# Patient Record
Sex: Female | Born: 1955 | Hispanic: No | Marital: Married | Smoking: Never smoker
Health system: Southern US, Community
[De-identification: ages and names within clinical notes are randomized; demographics above are authoritative.]

## PROBLEM LIST (undated history)

## (undated) DIAGNOSIS — I10 Essential (primary) hypertension: Secondary | ICD-10-CM

## (undated) DIAGNOSIS — E119 Type 2 diabetes mellitus without complications: Secondary | ICD-10-CM

## (undated) DIAGNOSIS — F419 Anxiety disorder, unspecified: Secondary | ICD-10-CM

## (undated) DIAGNOSIS — M1990 Unspecified osteoarthritis, unspecified site: Secondary | ICD-10-CM

## (undated) DIAGNOSIS — T8859XA Other complications of anesthesia, initial encounter: Secondary | ICD-10-CM

## (undated) DIAGNOSIS — N390 Urinary tract infection, site not specified: Secondary | ICD-10-CM

## (undated) DIAGNOSIS — H9190 Unspecified hearing loss, unspecified ear: Secondary | ICD-10-CM

## (undated) DIAGNOSIS — R51 Headache: Secondary | ICD-10-CM

## (undated) DIAGNOSIS — E079 Disorder of thyroid, unspecified: Secondary | ICD-10-CM

## (undated) DIAGNOSIS — T7840XA Allergy, unspecified, initial encounter: Secondary | ICD-10-CM

## (undated) DIAGNOSIS — E039 Hypothyroidism, unspecified: Secondary | ICD-10-CM

## (undated) DIAGNOSIS — H15009 Unspecified scleritis, unspecified eye: Secondary | ICD-10-CM

## (undated) DIAGNOSIS — K219 Gastro-esophageal reflux disease without esophagitis: Secondary | ICD-10-CM

## (undated) DIAGNOSIS — G5600 Carpal tunnel syndrome, unspecified upper limb: Secondary | ICD-10-CM

## (undated) HISTORY — DX: Anxiety disorder, unspecified: F41.9

## (undated) HISTORY — PX: INNER EAR SURGERY: SHX679

## (undated) HISTORY — DX: Unspecified scleritis, unspecified eye: H15.009

## (undated) HISTORY — DX: Headache: R51

## (undated) HISTORY — PX: UPPER GASTROINTESTINAL ENDOSCOPY: SHX188

## (undated) HISTORY — DX: Allergy, unspecified, initial encounter: T78.40XA

## (undated) HISTORY — DX: Unspecified hearing loss, unspecified ear: H91.90

## (undated) HISTORY — DX: Disorder of thyroid, unspecified: E07.9

## (undated) HISTORY — PX: FRACTURE SURGERY: SHX138

## (undated) HISTORY — PX: TUBAL LIGATION: SHX77

## (undated) HISTORY — DX: Carpal tunnel syndrome, unspecified upper limb: G56.00

## (undated) HISTORY — DX: Gastro-esophageal reflux disease without esophagitis: K21.9

## (undated) HISTORY — DX: Urinary tract infection, site not specified: N39.0

---

## 1997-03-26 ENCOUNTER — Ambulatory Visit (HOSPITAL_COMMUNITY): Admission: RE | Admit: 1997-03-26 | Discharge: 1997-03-26 | Payer: Self-pay | Admitting: Family Medicine

## 1998-05-13 ENCOUNTER — Ambulatory Visit (HOSPITAL_COMMUNITY): Admission: RE | Admit: 1998-05-13 | Discharge: 1998-05-13 | Payer: Self-pay | Admitting: Family Medicine

## 1998-05-13 ENCOUNTER — Encounter: Payer: Self-pay | Admitting: Family Medicine

## 1999-04-20 ENCOUNTER — Other Ambulatory Visit: Admission: RE | Admit: 1999-04-20 | Discharge: 1999-04-20 | Payer: Self-pay | Admitting: Internal Medicine

## 1999-05-14 ENCOUNTER — Encounter: Payer: Self-pay | Admitting: Internal Medicine

## 1999-05-14 ENCOUNTER — Ambulatory Visit (HOSPITAL_COMMUNITY): Admission: RE | Admit: 1999-05-14 | Discharge: 1999-05-14 | Payer: Self-pay | Admitting: Internal Medicine

## 2000-07-05 ENCOUNTER — Ambulatory Visit (HOSPITAL_COMMUNITY): Admission: RE | Admit: 2000-07-05 | Discharge: 2000-07-05 | Payer: Self-pay | Admitting: Internal Medicine

## 2000-07-05 ENCOUNTER — Encounter: Payer: Self-pay | Admitting: Internal Medicine

## 2001-08-03 ENCOUNTER — Encounter: Payer: Self-pay | Admitting: Internal Medicine

## 2001-08-03 ENCOUNTER — Ambulatory Visit (HOSPITAL_COMMUNITY): Admission: RE | Admit: 2001-08-03 | Discharge: 2001-08-03 | Payer: Self-pay | Admitting: Internal Medicine

## 2002-04-11 ENCOUNTER — Encounter: Admission: RE | Admit: 2002-04-11 | Discharge: 2002-04-11 | Payer: Self-pay | Admitting: Internal Medicine

## 2002-04-11 ENCOUNTER — Encounter: Payer: Self-pay | Admitting: Internal Medicine

## 2002-06-18 ENCOUNTER — Other Ambulatory Visit: Admission: RE | Admit: 2002-06-18 | Discharge: 2002-06-18 | Payer: Self-pay | Admitting: Internal Medicine

## 2004-01-07 ENCOUNTER — Other Ambulatory Visit: Admission: RE | Admit: 2004-01-07 | Discharge: 2004-01-07 | Payer: Self-pay | Admitting: Internal Medicine

## 2004-01-07 ENCOUNTER — Ambulatory Visit: Payer: Self-pay | Admitting: Internal Medicine

## 2005-04-08 ENCOUNTER — Ambulatory Visit: Payer: Self-pay | Admitting: Internal Medicine

## 2005-04-12 ENCOUNTER — Ambulatory Visit (HOSPITAL_COMMUNITY): Admission: RE | Admit: 2005-04-12 | Discharge: 2005-04-12 | Payer: Self-pay | Admitting: Internal Medicine

## 2005-04-15 ENCOUNTER — Ambulatory Visit: Payer: Self-pay | Admitting: Internal Medicine

## 2005-04-15 ENCOUNTER — Other Ambulatory Visit: Admission: RE | Admit: 2005-04-15 | Discharge: 2005-04-15 | Payer: Self-pay | Admitting: Internal Medicine

## 2005-04-18 ENCOUNTER — Ambulatory Visit: Payer: Self-pay | Admitting: Gastroenterology

## 2005-04-19 ENCOUNTER — Encounter: Admission: RE | Admit: 2005-04-19 | Discharge: 2005-04-19 | Payer: Self-pay | Admitting: Internal Medicine

## 2005-05-02 ENCOUNTER — Ambulatory Visit: Payer: Self-pay | Admitting: Gastroenterology

## 2005-05-02 LAB — HM COLONOSCOPY: HM Colonoscopy: NORMAL

## 2005-06-23 ENCOUNTER — Ambulatory Visit: Payer: Self-pay | Admitting: Family Medicine

## 2005-06-27 ENCOUNTER — Ambulatory Visit: Payer: Self-pay | Admitting: Internal Medicine

## 2005-12-15 ENCOUNTER — Ambulatory Visit: Payer: Self-pay | Admitting: Internal Medicine

## 2005-12-15 LAB — CONVERTED CEMR LAB
BUN: 14 mg/dL (ref 6–23)
Basophils Relative: 0.7 % (ref 0.0–1.0)
CO2: 28 meq/L (ref 19–32)
Chloride: 104 meq/L (ref 96–112)
Eosinophil percent: 4.7 % (ref 0.0–5.0)
Folate: 19.2 ng/mL
Free T4: 0.9 ng/dL (ref 0.9–1.8)
GFR calc non Af Amer: 80 mL/min
Glomerular Filtration Rate, Af Am: 97 mL/min/{1.73_m2}
Glucose, Bld: 84 mg/dL (ref 70–99)
HCT: 36.9 % (ref 36.0–46.0)
Lymphocytes Relative: 24.5 % (ref 12.0–46.0)
MCHC: 32.8 g/dL (ref 30.0–36.0)
MCV: 85.5 fL (ref 78.0–100.0)
Monocytes Absolute: 0.4 10*3/uL (ref 0.2–0.7)
Neutro Abs: 3.7 10*3/uL (ref 1.4–7.7)
Neutrophils Relative %: 62.4 % (ref 43.0–77.0)
Platelets: 208 10*3/uL (ref 150–400)
Potassium: 3.7 meq/L (ref 3.5–5.1)
RBC: 4.32 M/uL (ref 3.87–5.11)
Sodium: 139 meq/L (ref 135–145)
TSH: 4.28 microintl units/mL (ref 0.35–5.50)
Vitamin B-12: 339 pg/mL (ref 211–911)

## 2005-12-30 ENCOUNTER — Ambulatory Visit: Payer: Self-pay | Admitting: Internal Medicine

## 2006-04-14 ENCOUNTER — Ambulatory Visit: Payer: Self-pay | Admitting: Internal Medicine

## 2006-04-14 LAB — CONVERTED CEMR LAB
ALT: 27 units/L (ref 0–40)
Albumin: 3.8 g/dL (ref 3.5–5.2)
Alkaline Phosphatase: 58 units/L (ref 39–117)
Basophils Absolute: 0 10*3/uL (ref 0.0–0.1)
Bilirubin, Direct: 0.1 mg/dL (ref 0.0–0.3)
Cholesterol: 184 mg/dL (ref 0–200)
Creatinine,U: 86.1 mg/dL
Eosinophils Absolute: 0.2 10*3/uL (ref 0.0–0.6)
GFR calc Af Amer: 136 mL/min
GFR calc non Af Amer: 112 mL/min
Glucose, Bld: 89 mg/dL (ref 70–99)
HCT: 36.1 % (ref 36.0–46.0)
HDL: 49 mg/dL (ref >39.0)
MCHC: 34.7 g/dL (ref 30.0–36.0)
MCV: 84.5 fL (ref 78.0–100.0)
Microalb Creat Ratio: 8.1 mg/g (ref 0.0–30.0)
Platelets: 214 10*3/uL (ref 150–400)
Potassium: 4.2 meq/L (ref 3.5–5.1)
Sodium: 134 meq/L — ABNORMAL LOW (ref 135–145)
TSH: 4.06 microintl units/mL (ref 0.35–5.50)
Total Protein: 7.7 g/dL (ref 6.0–8.3)

## 2006-04-21 ENCOUNTER — Other Ambulatory Visit: Admission: RE | Admit: 2006-04-21 | Discharge: 2006-04-21 | Payer: Self-pay | Admitting: Internal Medicine

## 2006-04-21 ENCOUNTER — Ambulatory Visit: Payer: Self-pay | Admitting: Internal Medicine

## 2006-04-21 LAB — CONVERTED CEMR LAB: Free T4: 0.7 ng/dL (ref 0.6–1.6)

## 2006-05-01 ENCOUNTER — Encounter: Admission: RE | Admit: 2006-05-01 | Discharge: 2006-05-01 | Payer: Self-pay | Admitting: Internal Medicine

## 2006-07-19 ENCOUNTER — Ambulatory Visit: Payer: Self-pay | Admitting: Internal Medicine

## 2006-09-21 DIAGNOSIS — R519 Headache, unspecified: Secondary | ICD-10-CM | POA: Insufficient documentation

## 2006-09-21 DIAGNOSIS — J309 Allergic rhinitis, unspecified: Secondary | ICD-10-CM | POA: Insufficient documentation

## 2006-09-21 DIAGNOSIS — R51 Headache: Secondary | ICD-10-CM

## 2006-10-04 ENCOUNTER — Encounter: Payer: Self-pay | Admitting: Internal Medicine

## 2007-01-29 ENCOUNTER — Encounter: Payer: Self-pay | Admitting: Internal Medicine

## 2007-02-05 ENCOUNTER — Ambulatory Visit: Payer: Self-pay | Admitting: Internal Medicine

## 2007-02-05 DIAGNOSIS — M79609 Pain in unspecified limb: Secondary | ICD-10-CM

## 2007-02-05 DIAGNOSIS — R030 Elevated blood-pressure reading, without diagnosis of hypertension: Secondary | ICD-10-CM | POA: Insufficient documentation

## 2007-02-12 ENCOUNTER — Encounter: Payer: Self-pay | Admitting: Internal Medicine

## 2007-02-20 ENCOUNTER — Ambulatory Visit: Payer: Self-pay | Admitting: Internal Medicine

## 2007-02-20 DIAGNOSIS — R1013 Epigastric pain: Secondary | ICD-10-CM | POA: Insufficient documentation

## 2007-02-23 ENCOUNTER — Encounter: Admission: RE | Admit: 2007-02-23 | Discharge: 2007-02-23 | Payer: Self-pay | Admitting: Internal Medicine

## 2007-03-06 ENCOUNTER — Ambulatory Visit: Payer: Self-pay | Admitting: Internal Medicine

## 2007-03-06 DIAGNOSIS — E669 Obesity, unspecified: Secondary | ICD-10-CM

## 2007-03-06 DIAGNOSIS — K219 Gastro-esophageal reflux disease without esophagitis: Secondary | ICD-10-CM

## 2007-03-06 DIAGNOSIS — N951 Menopausal and female climacteric states: Secondary | ICD-10-CM | POA: Insufficient documentation

## 2007-03-14 ENCOUNTER — Encounter: Payer: Self-pay | Admitting: Internal Medicine

## 2007-03-15 ENCOUNTER — Ambulatory Visit: Payer: Self-pay | Admitting: Internal Medicine

## 2007-03-15 LAB — CONVERTED CEMR LAB
HCV Ab: NEGATIVE
Hep B C IgM: NEGATIVE
Hep B Core Total Ab: NEGATIVE

## 2007-03-16 ENCOUNTER — Encounter: Payer: Self-pay | Admitting: Internal Medicine

## 2007-03-23 LAB — CONVERTED CEMR LAB
ALT: 33 units/L (ref 0–35)
Albumin: 4.1 g/dL (ref 3.5–5.2)
Bilirubin, Direct: 0.1 mg/dL (ref 0.0–0.3)
CO2: 30 meq/L (ref 19–32)
Direct LDL: 107.5 mg/dL
Eosinophils Absolute: 0.1 10*3/uL (ref 0.0–0.6)
Eosinophils Relative: 2 % (ref 0.0–5.0)
Ferritin: 37.7 ng/mL (ref 10.0–291.0)
GFR calc Af Amer: 113 mL/min
GFR calc non Af Amer: 93 mL/min
Glucose, Bld: 97 mg/dL (ref 70–99)
H Pylori IgG: POSITIVE — AB
HDL: 69.8 mg/dL (ref >39.0)
Hemoglobin: 12.5 g/dL (ref 12.0–15.0)
Neutrophils Relative %: 68.2 % (ref 43.0–77.0)
Total Bilirubin: 0.8 mg/dL (ref 0.3–1.2)
Total CHOL/HDL Ratio: 2.9
VLDL: 14 mg/dL (ref 0–40)

## 2007-04-03 ENCOUNTER — Ambulatory Visit: Payer: Self-pay | Admitting: Gastroenterology

## 2007-04-15 HISTORY — PX: ESOPHAGOGASTRODUODENOSCOPY: SHX1529

## 2007-05-02 ENCOUNTER — Encounter: Payer: Self-pay | Admitting: Internal Medicine

## 2007-05-02 ENCOUNTER — Ambulatory Visit: Payer: Self-pay | Admitting: Gastroenterology

## 2007-05-02 ENCOUNTER — Encounter: Payer: Self-pay | Admitting: Gastroenterology

## 2007-05-16 ENCOUNTER — Ambulatory Visit: Payer: Self-pay | Admitting: Internal Medicine

## 2007-05-16 DIAGNOSIS — K2980 Duodenitis without bleeding: Secondary | ICD-10-CM | POA: Insufficient documentation

## 2007-06-06 ENCOUNTER — Encounter: Admission: RE | Admit: 2007-06-06 | Discharge: 2007-06-06 | Payer: Self-pay | Admitting: Internal Medicine

## 2007-06-08 ENCOUNTER — Encounter: Payer: Self-pay | Admitting: Internal Medicine

## 2007-06-15 ENCOUNTER — Ambulatory Visit: Payer: Self-pay | Admitting: Gastroenterology

## 2007-06-15 LAB — CONVERTED CEMR LAB: Tissue Transglutaminase Ab, IgA: 0.7 units (ref <7)

## 2007-06-20 ENCOUNTER — Encounter: Admission: RE | Admit: 2007-06-20 | Discharge: 2007-06-20 | Payer: Self-pay | Admitting: Internal Medicine

## 2008-04-29 ENCOUNTER — Ambulatory Visit: Payer: Self-pay | Admitting: Internal Medicine

## 2008-04-29 DIAGNOSIS — R209 Unspecified disturbances of skin sensation: Secondary | ICD-10-CM | POA: Insufficient documentation

## 2008-05-08 ENCOUNTER — Encounter: Payer: Self-pay | Admitting: Internal Medicine

## 2008-05-22 ENCOUNTER — Ambulatory Visit: Payer: Self-pay | Admitting: Internal Medicine

## 2008-05-22 LAB — CONVERTED CEMR LAB
Albumin: 4 g/dL (ref 3.5–5.2)
Basophils Relative: 0.3 % (ref 0.0–3.0)
Calcium: 9.5 mg/dL (ref 8.4–10.5)
Chloride: 105 meq/L (ref 96–112)
Cholesterol: 201 mg/dL — ABNORMAL HIGH (ref 0–200)
Direct LDL: 118.5 mg/dL
Eosinophils Absolute: 0.2 10*3/uL (ref 0.0–0.7)
Eosinophils Relative: 3.1 % (ref 0.0–5.0)
Hemoglobin, Urine: NEGATIVE
Leukocytes, UA: NEGATIVE
Lymphs Abs: 1.5 10*3/uL (ref 0.7–4.0)
MCV: 85.4 fL (ref 78.0–100.0)
Monocytes Absolute: 0.3 10*3/uL (ref 0.1–1.0)
Neutro Abs: 2.9 10*3/uL (ref 1.4–7.7)
Platelets: 181 10*3/uL (ref 150.0–400.0)
Potassium: 4.5 meq/L (ref 3.5–5.1)
Sodium: 142 meq/L (ref 135–145)
Specific Gravity, Urine: 1.01 (ref 1.000–1.030)
TSH: 5.59 microintl units/mL — ABNORMAL HIGH (ref 0.35–5.50)
Total Bilirubin: 0.9 mg/dL (ref 0.3–1.2)
Total Protein, Urine: NEGATIVE mg/dL
Total Protein: 7.4 g/dL (ref 6.0–8.3)
Triglycerides: 88 mg/dL (ref 0.0–149.0)
Urine Glucose: NEGATIVE mg/dL
Urobilinogen, UA: 0.2 (ref 0.0–1.0)
VLDL: 17.6 mg/dL (ref 0.0–40.0)
pH: 6 (ref 5.0–8.0)

## 2008-05-29 ENCOUNTER — Ambulatory Visit: Payer: Self-pay | Admitting: Internal Medicine

## 2008-05-29 DIAGNOSIS — R7309 Other abnormal glucose: Secondary | ICD-10-CM

## 2008-06-26 ENCOUNTER — Encounter: Admission: RE | Admit: 2008-06-26 | Discharge: 2008-06-26 | Payer: Self-pay | Admitting: Internal Medicine

## 2008-07-02 ENCOUNTER — Encounter: Admission: RE | Admit: 2008-07-02 | Discharge: 2008-07-02 | Payer: Self-pay | Admitting: Internal Medicine

## 2008-07-17 ENCOUNTER — Ambulatory Visit: Payer: Self-pay | Admitting: Internal Medicine

## 2008-07-17 DIAGNOSIS — N390 Urinary tract infection, site not specified: Secondary | ICD-10-CM

## 2008-07-17 LAB — CONVERTED CEMR LAB
Bilirubin Urine: NEGATIVE
Hgb A1c MFr Bld: 5.4 % (ref 4.6–6.5)
Ketones, urine, test strip: NEGATIVE
Nitrite: NEGATIVE
TSH: 4.14 microintl units/mL (ref 0.35–5.50)
pH: 7

## 2008-07-18 ENCOUNTER — Encounter: Payer: Self-pay | Admitting: Internal Medicine

## 2008-07-22 ENCOUNTER — Ambulatory Visit: Payer: Self-pay | Admitting: Internal Medicine

## 2008-09-24 ENCOUNTER — Ambulatory Visit: Payer: Self-pay | Admitting: Internal Medicine

## 2008-10-01 ENCOUNTER — Ambulatory Visit: Payer: Self-pay | Admitting: Internal Medicine

## 2008-10-01 DIAGNOSIS — M6530 Trigger finger, unspecified finger: Secondary | ICD-10-CM | POA: Insufficient documentation

## 2008-10-10 ENCOUNTER — Encounter: Payer: Self-pay | Admitting: Internal Medicine

## 2008-11-07 ENCOUNTER — Encounter: Payer: Self-pay | Admitting: Internal Medicine

## 2008-12-31 ENCOUNTER — Ambulatory Visit: Payer: Self-pay | Admitting: Internal Medicine

## 2009-01-14 ENCOUNTER — Ambulatory Visit: Payer: Self-pay | Admitting: Internal Medicine

## 2009-01-14 DIAGNOSIS — E039 Hypothyroidism, unspecified: Secondary | ICD-10-CM | POA: Insufficient documentation

## 2009-01-14 DIAGNOSIS — G5600 Carpal tunnel syndrome, unspecified upper limb: Secondary | ICD-10-CM

## 2009-03-31 ENCOUNTER — Encounter: Payer: Self-pay | Admitting: Internal Medicine

## 2009-06-18 ENCOUNTER — Ambulatory Visit: Payer: Self-pay | Admitting: Internal Medicine

## 2009-06-18 LAB — CONVERTED CEMR LAB
ALT: 24 units/L (ref 0–35)
AST: 21 units/L (ref 0–37)
Albumin: 4.1 g/dL (ref 3.5–5.2)
Basophils Absolute: 0 10*3/uL (ref 0.0–0.1)
Calcium: 9.6 mg/dL (ref 8.4–10.5)
Cholesterol: 197 mg/dL (ref 0–200)
Creatinine, Ser: 0.6 mg/dL (ref 0.4–1.2)
Eosinophils Absolute: 0.2 10*3/uL (ref 0.0–0.7)
GFR calc non Af Amer: 112.4 mL/min (ref >60)
Glucose, Urine, Semiquant: NEGATIVE
HCT: 37.4 % (ref 36.0–46.0)
HDL: 69.9 mg/dL (ref >39.00)
Hemoglobin: 12.7 g/dL (ref 12.0–15.0)
Lymphs Abs: 2 10*3/uL (ref 0.7–4.0)
MCHC: 34 g/dL (ref 30.0–36.0)
Monocytes Absolute: 0.4 10*3/uL (ref 0.1–1.0)
Neutrophils Relative %: 56.4 % (ref 43.0–77.0)
Nitrite: NEGATIVE
Platelets: 213 10*3/uL (ref 150.0–400.0)
Potassium: 4.3 meq/L (ref 3.5–5.1)
RBC: 4.29 M/uL (ref 3.87–5.11)
Sodium: 140 meq/L (ref 135–145)
TSH: 4.21 microintl units/mL (ref 0.35–5.50)
Total Bilirubin: 0.4 mg/dL (ref 0.3–1.2)
Total CHOL/HDL Ratio: 3
VLDL: 21.4 mg/dL (ref 0.0–40.0)
WBC: 6.1 10*3/uL (ref 4.5–10.5)

## 2009-06-22 ENCOUNTER — Ambulatory Visit: Payer: Self-pay | Admitting: Internal Medicine

## 2009-06-22 DIAGNOSIS — F430 Acute stress reaction: Secondary | ICD-10-CM | POA: Insufficient documentation

## 2009-08-14 ENCOUNTER — Telehealth: Payer: Self-pay | Admitting: *Deleted

## 2009-08-14 ENCOUNTER — Other Ambulatory Visit: Admission: RE | Admit: 2009-08-14 | Discharge: 2009-08-14 | Payer: Self-pay | Admitting: Internal Medicine

## 2009-08-14 ENCOUNTER — Encounter: Admission: RE | Admit: 2009-08-14 | Discharge: 2009-08-14 | Payer: Self-pay | Admitting: Internal Medicine

## 2009-08-14 ENCOUNTER — Ambulatory Visit: Payer: Self-pay | Admitting: Internal Medicine

## 2009-08-14 LAB — HM PAP SMEAR

## 2009-08-14 LAB — HM MAMMOGRAPHY

## 2009-08-20 LAB — CONVERTED CEMR LAB: Pap Smear: NEGATIVE

## 2009-08-21 ENCOUNTER — Encounter (INDEPENDENT_AMBULATORY_CARE_PROVIDER_SITE_OTHER): Payer: Self-pay

## 2009-11-17 ENCOUNTER — Telehealth: Payer: Self-pay | Admitting: Internal Medicine

## 2010-03-07 ENCOUNTER — Encounter: Payer: Self-pay | Admitting: Internal Medicine

## 2010-03-16 NOTE — Progress Notes (Signed)
Summary: wants abx-please triage  Phone Note Call from Patient Call back at [phone]   Caller: Spouse--live call Call For: Jackie Headings MD Summary of Call: pt is in [place] and has an Uti. [facility]ph---[phone] Pt is having frequency, urgency, and dysuria since last night.  No fever.  No hematuria. No allergies. Initial call taken by: Warnell Forester,  November 17, 2009 10:14 AM  Follow-up for Phone Call        if no fever  can do macrobid 1 by mouth two times a day 100 mg for 7 days  if continuing need to be seen in urgent care there. Follow-up by: Jackie Headings MD,  November 17, 2009 12:39 PM    New/Updated Medications: MACROBID 100 MG CAPS (NITROFURANTOIN MONOHYD MACRO) one by mouth two times a day x 7 days Prescriptions: MACROBID 100 MG CAPS (NITROFURANTOIN MONOHYD MACRO) one by mouth two times a day x 7 days  #14 x 0   Entered by:   Lynann Beaver CMA   Authorized by:   Jackie Headings MD   Signed by:   Lynann Beaver CMA on 11/17/2009   Method used:   Telephoned to ...       [facility]  [phone]* (retail)       [address] [place], [place]  [address]       Ph: [phone] or [phone]       Fax: [phone]   RxID:   [id number]  Telephoned to pt's pharmacy of choice.

## 2010-03-16 NOTE — Progress Notes (Signed)
Summary: ov today  Phone Note Call from Patient Call back at [phone]   Caller: Patient---live call Summary of Call: wants to be seen today around 3:30 pm for a pap only. having mammogram at 2:00pm Initial call taken by: Warnell Forester,  August 14, 2009 12:58 PM  Follow-up for Phone Call        I explained to pt that we don't have anything this afternoon for a pap only but could work her in at another time. Pt states that they are moving her son to a place in [place] on Tues, so she has to have something after 3:30pm due to her MMG appt. I explained again that if she wanted to schedule something at a later date or call us back some other time, that we would be more than happy to work with her about scheduling this appt or if she could have called this am, we may have been able to see her today.  Follow-up by: Romualdo Bolk, CMA (AAMA),  August 14, 2009 1:10 PM  Additional Follow-up for Phone Call Additional follow up Details #1::        Pt to come in today by 3pm if not able to make by 3pm Dr. Fabian Sharp states that we can see her on Tues at 11:30am. Additional Follow-up by: Romualdo Bolk, CMA (AAMA),  August 14, 2009 2:02 PM

## 2010-03-16 NOTE — Letter (Signed)
Summary: Generic Letter  [place] [facility]  [address] [place], [place] [address]   Phone: [phone]  Fax: [phone]    08/21/2009  [facility] [address] [place], [place]  [address]  Dear Ms. Guderian,      Pap smear is normal.       Sincerely,   Tomma Lightning, RN

## 2010-03-16 NOTE — Assessment & Plan Note (Signed)
Summary: pap only/ssc   Vital Signs:  Patient profile:   55 year old female Menstrual status:  postmenopausal Weight:      188 pounds Pulse rate:   72 / minute BP sitting:   140 / 80  (left arm) Cuff size:   regular  Vitals Entered By: Romualdo Bolk, CMA (AAMA) (August 14, 2009 2:56 PM) CC: Pap only   History of Present Illness: Jackie Lewis comes in today  as request to do her wellness well women exam   as  a work in. She missed her check up beacuse of sons life threatening brain injury  and   he is is rehab   transferring to [place] [facility] for at least a month next week.    Needs pap and  med check. has been taking thyroid med regularly. none missed labs done in May.  no change in health and  didnt take xanax yet.   Preventive Screening-Counseling & Management  Alcohol-Tobacco     Alcohol drinks/day: 0     Smoking Status: never  Caffeine-Diet-Exercise     Caffeine use/day: 4     Does Patient Exercise: no  Hep-HIV-STD-Contraception     Dental Visit-last 6 months yes     Sun Exposure-Excessive: no  Safety-Violence-Falls     Seat Belt Use: yes  Current Medications (verified): 1)  Synthroid 75 Mcg Tabs (Levothyroxine Sodium) .Marland Kitchen.. 1 By Mouth Once Daily 2)  Balneol  Lotn (Incontinent Wash) .... Use As Directed 3)  Alprazolam 0.25 Mg Tabs (Alprazolam) .Marland Kitchen.. 1-2 By Mouth Three Times A Day As Needed Anxiety  Allergies (verified): 1)  ! Epinephrine  Past History:  Past medical, surgical, family and social histories (including risk factors) reviewed, and no changes noted (except as noted below).  Past Medical History: Reviewed history from 01/14/2009 and no changes required. 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 CTS  Past Surgical History: Reviewed history from 04/29/2008 and no changes required. G6 P3 Ear sx x 3 Colonoscopy-05/02/2005 EGD  3 2009   Past History:  Care Management:  Gastroenterology:  [facility]opedics:[facility]  Family History: Reviewed history from 07/22/2008 and no changes required. Family History Diabetes 1st degree relative  father Family History High cholesterol Family History Hypertension Family History of Stroke M 1st degree relative   Fam hx of hep b carriers Child with thyroid disease MOM is  95 and has no meds but urinary frequency  Son traumatic brain injury   2011  Social History: Reviewed history from 06/22/2009 and no changes required. 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    Physical Exam  General:  alert, well-developed, well-nourished, and well-hydrated.   Head:  normocephalic and atraumatic.   Eyes:  vision grossly intact.  eoms Ears:  R ear normal, L ear normal, and no external deformities.   Mouth:  pharynx pink and moist.   Neck:  no nodules  Breasts:  No mass, nodules, thickening, tenderness, bulging, retraction,  inflamation, nipple discharge or skin changes noted.   Lungs:  Normal respiratory effort, chest expands symmetrically. Lungs are clear to auscultation, no crackles or wheezes. Heart:  Normal rate and regular rhythm. S1 and S2 normal without gallop, murmur, click, rub or other extra sounds. Abdomen:  Bowel sounds positive,abdomen soft and non-tender without masses, organomegaly or   noted. Rectal:  No external abnormalities noted. but a tag  Normal sphincter tone. No rectal masses or tenderness. Genitalia:  Pelvic Exam:         External: normal female genitalia without lesions or masses        Vagina: normal without lesions or masses        Cervix: normal without lesions or masses        Adnexa: normal bimanual exam without masses or fullness        Uterus: normal by palpation        Pap smear: performed Msk:  no joint warmth and no redness over joints.   Pulses:  pulses intact without delay   Extremities:  no clubbing cyanosis or edema  Neurologic:  alert & oriented X3, strength normal in all extremities, and gait normal.   Skin:  turgor normal, color normal, no ecchymoses, and no petechiae.   some lip lentigenies Cervical Nodes:  No lymphadenopathy noted Axillary Nodes:  No palpable lymphadenopathy Inguinal Nodes:  No significant adenopathy Psych:  Oriented X3, good eye contact, not anxious appearing, and not depressed appearing.  approprate for the situation see labs    Impression & Recommendations:  Problem # 1:  HEALTH MAINTENANCE EXAM, ADULT (ICD-V70.0) counseled   will follow     utd on  parameteres  nl mammogram.   Problem # 2:  HYPOTHYROIDISM (ICD-244.9)  will adjust   sample given of 88 microgramto increase and recheck tsh in 3 months     The following medications were removed from the medication list:    Synthroid 75 Mcg Tabs (Levothyroxine sodium) .Marland Kitchen... 1 by mouth once daily Her updated medication list for this problem includes:    Synthroid 88 Mcg Tabs (Levothyroxine sodium) .Marland Kitchen... 1 by mouth once daily  Labs Reviewed: TSH: 4.21 (06/18/2009)    HgBA1c: 5.4 (07/17/2008) Chol: 197 (06/18/2009)   HDL: 69.90 (06/18/2009)   LDL: 106 (06/18/2009)   TG: 107.0 (06/18/2009)  Problem # 3:  OTHER HEALTH PROBLEM WITHIN THE FAMILY SON HEAD TRAUMA (ICD-V61.49) to go to [place] to live for at least a month     rewrote xanax  rx  if needed   Problem # 4:  ROUTINE GYNECOLOGICAL EXAM (ICD-V72.31)  PAP done  Orders: Pap Smear, Thin Prep ( Collection of) ([id number])  Complete Medication List: 1)  Balneol  Lotn (Incontinent wash) .... Use as directed 2)  Alprazolam 0.25 Mg Tabs (Alprazolam) .Marland Kitchen.. 1-2 by mouth three times a day as needed anxiety 3)  Synthroid 88 Mcg Tabs (Levothyroxine sodium) .Marland Kitchen.. 1 by mouth once daily  Patient Instructions: 1)  increase synthroid to .088 mg  when finish current  dose. 2)  Lab appt for TSH in 3 months and then ROV. 3)  Call in meantime  if needed. 4)  [facility] [place]   branch if needed. Prescriptions: SYNTHROID 88 MCG TABS (LEVOTHYROXINE SODIUM) 1 by mouth once daily Brand medically necessary #30 x 3   Entered and Authorized by:   [name]   Signed by:   [name] on 08/14/2009  Method used:   [facility]        [facility]  [phone]* (retail)       [address] [place], [place]  [address]       Ph: [phone] or [phone]       Fax: [phone]   RxID:   [id number] ALPRAZOLAM 0.25 MG TABS (ALPRAZOLAM) 1-2 by mouth three times a day as needed anxiety  #30 x 0   Entered and Authorized by:   [name]   Signed by:   [name] on 08/14/2009   Method used:   Print then Give to Patient   RxID:   [id number]  samples given of the synthroid 88

## 2010-03-16 NOTE — Assessment & Plan Note (Signed)
Summary: Jackie Lewis right arm is giving Jackie Lewis problems//db   Vital Signs:  Patient Profile:   55 Years Old Female Weight:      192 pounds Pulse rate:   88 / minute BP sitting:   150 / 82  (left arm) Cuff size:   regular  Vitals Entered By: Romualdo Bolk, CMA (February 05, 2007 1:56 PM)                 Serial Vital Signs/Assessments:  Time      Position  BP       Pulse  Resp  Temp     By                     132/80                         Jackie Lewis   Chief Complaint:  Rt arm getting worse.  History of Present Illness: Jackie Lewis is here for rt arm pain. Pain worse at night. Hand and fingers go numb.  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 No specific injury and discomfort has been present off and on for 3-4 years however recent increase in symptom and swelling lump feeling in upper arm.   No pain with rom of shoulder  and pain is localized .  Numbness is in fingers at night but no elbow pain.   BP readings are good at work. TOok advil  x 1 ? some help . Tried heat ? no help. Becoming more problematic. No weakness.   GERD is better on meds  Current Allergies (reviewed today): ! NOVOCAIN  Past Medical History:    Allergic rhinitis    Headache    Recurrent uti    Hx scleritis    Hg 11.3   Family History:    Family History Diabetes 1st degree relative  father    Family History High cholesterol    Family History Hypertension    Family History of Stroke M 1st degree relative   Social History:    Reviewed history from 09/21/2006 and no changes required:       Married       Never Smoked       Alcohol use-no       Drug use-no       Regular exercise-no   Risk Factors:  Tobacco use:  never Drug use:  no Alcohol use:  no Exercise:  no    Physical Exam  General:     Well-developed,well-nourished,in no acute distress; alert,appropriate and cooperative throughout examination Head:     normocephalic and atraumatic.   Neck:     No  deformities, masses, or tenderness noted. Msk:     nl rom shoulder   tender lateral upper arm  ? st swelling     no redness.  no shoulder tenderness Pulses:     R and L carotid,radial,femoral,dorsalis pedis and posterior tibial pulses are full and equal bilaterally Extremities:     see above  no edema Skin:     turgor normal and color normal.      Impression & Recommendations:  Problem # 1:  ARM PAIN, RIGHT (ICD-729.5) seems soft tissue but ? with swelling ? lipoma    ? atypical tendinitis.   Numbness hs seems to be positional and ? unrelated.    Rec ortho consult and will need imaging.  In  the meantime use ice advil 600 two times a day to asses response.   Orders: Orthopedic Referral (Ortho)   Problem # 2:  ELEVATED BP READING WITHOUT DX HYPERTENSION (ICD-796.2) repeat is normal    monitor and call if increased     ]
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
## 2010-03-16 NOTE — Consult Note (Signed)
Summary: [facility]  [facility]   Imported By: Maryln Gottron 04/08/2009 15:03:31  _____________________________________________________________________  External Attachment:    Type:   Image     Comment:   External Document

## 2010-03-23 ENCOUNTER — Telehealth: Payer: Self-pay | Admitting: Internal Medicine

## 2010-03-23 MED ORDER — LEVOTHYROXINE SODIUM 88 MCG PO TABS: 88.0000 ug | ORAL_TABLET | Freq: Every day | ORAL | Status: DC

## 2010-03-23 NOTE — Telephone Encounter (Signed)
Pt walked in and is requesting a refill of Synthroid 88 mcg. She is out. [facility].

## 2010-04-29 ENCOUNTER — Ambulatory Visit: Payer: Self-pay | Admitting: Internal Medicine

## 2010-06-22 ENCOUNTER — Other Ambulatory Visit (INDEPENDENT_AMBULATORY_CARE_PROVIDER_SITE_OTHER): Payer: BC Managed Care – PPO | Admitting: Internal Medicine

## 2010-06-22 DIAGNOSIS — Z0000 Encounter for general adult medical examination without abnormal findings: Secondary | ICD-10-CM

## 2010-06-22 DIAGNOSIS — Z13220 Encounter for screening for lipoid disorders: Secondary | ICD-10-CM

## 2010-06-22 LAB — CBC WITH DIFFERENTIAL/PLATELET
Eosinophils Absolute: 0.1 10*3/uL (ref 0.0–0.7)
Hemoglobin: 12.3 g/dL (ref 12.0–15.0)
Lymphocytes Relative: 32.4 % (ref 12.0–46.0)
Lymphs Abs: 1.5 10*3/uL (ref 0.7–4.0)
MCHC: 34.7 g/dL (ref 30.0–36.0)
Monocytes Absolute: 0.3 10*3/uL (ref 0.1–1.0)
Neutro Abs: 2.8 10*3/uL (ref 1.4–7.7)
Neutrophils Relative %: 58.1 % (ref 43.0–77.0)
WBC: 4.8 10*3/uL (ref 4.5–10.5)

## 2010-06-22 LAB — POCT URINALYSIS DIPSTICK
Blood, UA: NEGATIVE
Glucose, UA: NEGATIVE
Ketones, UA: NEGATIVE
Nitrite, UA: NEGATIVE
Protein, UA: NEGATIVE
Urobilinogen, UA: 0.2

## 2010-06-22 LAB — LIPID PANEL
Cholesterol: 205 mg/dL — ABNORMAL HIGH (ref 0–200)
HDL: 71.4 mg/dL (ref >39.00)
Total CHOL/HDL Ratio: 3

## 2010-06-22 LAB — HEPATIC FUNCTION PANEL
AST: 22 U/L (ref 0–37)
Albumin: 3.9 g/dL (ref 3.5–5.2)
Bilirubin, Direct: 0 mg/dL (ref 0.0–0.3)
Total Bilirubin: 0.6 mg/dL (ref 0.3–1.2)

## 2010-06-22 LAB — BASIC METABOLIC PANEL
Potassium: 4.7 mEq/L (ref 3.5–5.1)
Sodium: 140 mEq/L (ref 135–145)

## 2010-06-29 ENCOUNTER — Ambulatory Visit (INDEPENDENT_AMBULATORY_CARE_PROVIDER_SITE_OTHER): Payer: BC Managed Care – PPO | Admitting: Internal Medicine

## 2010-06-29 ENCOUNTER — Encounter: Payer: Self-pay | Admitting: Internal Medicine

## 2010-06-29 VITALS — BP 120/80 | HR 78 | Ht 61.25 in | Wt 185.0 lb

## 2010-06-29 DIAGNOSIS — Z136 Encounter for screening for cardiovascular disorders: Secondary | ICD-10-CM

## 2010-06-29 DIAGNOSIS — E039 Hypothyroidism, unspecified: Secondary | ICD-10-CM

## 2010-06-29 DIAGNOSIS — Z0000 Encounter for general adult medical examination without abnormal findings: Secondary | ICD-10-CM

## 2010-06-29 DIAGNOSIS — N951 Menopausal and female climacteric states: Secondary | ICD-10-CM

## 2010-06-29 DIAGNOSIS — Z8489 Family history of other specified conditions: Secondary | ICD-10-CM | POA: Insufficient documentation

## 2010-06-29 DIAGNOSIS — Z6379 Other stressful life events affecting family and household: Secondary | ICD-10-CM

## 2010-06-29 DIAGNOSIS — R7309 Other abnormal glucose: Secondary | ICD-10-CM

## 2010-06-29 MED ORDER — SYNTHROID 88 MCG PO TABS: 88.0000 ug | ORAL_TABLET | Freq: Every day | ORAL | Status: DC

## 2010-06-29 NOTE — Assessment & Plan Note (Signed)
[facility]                         GASTROENTEROLOGY OFFICE NOTE   Jackie Lewis, Jackie Lewis                          MRN:          [id number]  DATE:04/03/2007                            DOB:          Sep 29, 1955    REASON FOR EVALUATION:  Jackie Lewis is a 55 year old white female referred  through the courtesy of Dr. Fabian Sharp for evaluation of epigastric  abdominal pain.   HISTORY:  Jackie Lewis has had recurrent epigastric pain described as a  gnawing sensation lasting a few weeks in duration with relapses every  several months over the last year.  Jackie Lewis has noticed the pain does awaken  her from sleep and is alleviated by small meals.  Jackie Lewis recently had a  negative upper abdominal ultrasound exam except for fatty infiltration  of the liver.  CCK HIDA scan also was normal.  Jackie Lewis was recently found to  have Helicobacter pylori infection and has been on a PrevPak for the  last week with rather marked improvement in her symptoms.  Jackie Lewis denies  any hepatobiliary complaints, history of hepatitis or pancreatitis.  Jackie Lewis  is having normal bowel movements without melena or hematochezia.  I did  a colonoscopy on her in March 2007 that was unremarkable.  Jackie Lewis does have  a history of colon cancer in an aunt.  Jackie Lewis follows a fairly regular  diet.  Denies any specific food intolerances.   PAST MEDICAL HISTORY:  Otherwise fairly unremarkable except for some  mild borderline hypertension.  Jackie Lewis has had a previous tubal ligation in  1993.   MEDICATIONS:  1. PrevPak with Prevacid 30 mg twice a day.  2. Biaxin 500 mg twice a day.  3. Amoxicillin 500 mg twice a day.   ALLERGIES:  Jackie Lewis has a history of NOVOCAINE allergy.   FAMILY HISTORY:  Remarkable for prostate cancer in her father, but no  known gastrointestinal problems.   SOCIAL HISTORY:  Jackie Lewis is married and lives with her husband and children.  Jackie Lewis does not smoke or use ethanol.   REVIEW OF SYSTEMS:  Otherwise entirely  noncontributory.   PHYSICAL EXAMINATION:  GENERAL:  Jackie Lewis is a healthy-appearing white female  in no distress, appears her stated age.  VITAL SIGNS:  Jackie Lewis is 5 feet 6 inches and weighs 191 pounds.  Blood  pressure 122/70, pulse 68 and regular.  I could not appreciate stigmata  of chronic liver disease.  CHEST:  Entirely clear and Jackie Lewis was in a regular rhythm without murmurs,  gallops or rubs.  ABDOMEN:  I could not appreciate hepatosplenomegaly, abdominal masses or  tenderness.  Bowel sounds were normal.  EXTREMITIES:  Unremarkable.  MENTAL STATUS:  Normal.  RECTAL:  Deferred.   DIAGNOSTICS:  Recent ultrasound showed fatty infiltrate of the liver,  but otherwise was normal.   LABORATORY DATA:  CBC and metabolic profile were normal as was hepatitis  viral serology panel.  Lipid profile also was unremarkable without  significant hyperlipidemia.   ASSESSMENT:  1. 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  Jackie Lewis is currently doing much better      on PrevPak therapy.  2. Mild asymptomatic fatty infiltration of the liver with normal liver      enzymes.  3. Family history of colon carcinoma in her aunt with a negative      colonoscopy in the last 2 years.  4. Mild obesity.   RECOMMENDATIONS:  1. Finish PrevPak and then we will schedule endoscopy with biopsy      confirmed H. pylori healing in 1 month.  2. The patient advised as to dietary adjustments and exercise program      to keep her weight in check.  3. Information of H. pylori and fatty infiltration of the liver given      to the patient for her edification.  4. Continue other medication and follow up with Dr. Fabian Sharp as      previously planned.     Vania Rea. Jarold Motto, MD, Caleen Essex, FAGA  Electronically Signed    DRP/MedQ  DD: 04/03/2007  DT: 04/03/2007  Job #: [id number]   cc:   Neta Mends. Fabian Sharp, MD

## 2010-06-29 NOTE — Assessment & Plan Note (Signed)
Improved today now in the normal range

## 2010-06-29 NOTE — Patient Instructions (Signed)
Continue lifestyle intervention healthy eating and exercise . Stay on same synthroid dose . Don't take the med we gave you in the fall. ? macrobid . Check up in a year with labs   .

## 2010-06-29 NOTE — Assessment & Plan Note (Signed)
[facility]                         GASTROENTEROLOGY OFFICE NOTE   Jackie Lewis, Jackie Lewis                          MRN:          [id number]  DATE:06/15/2007                            DOB:          Nov 28, 1955    Jackie Lewis is doing well after repeat endoscopy and check for H. pylori  infection.   She previously was treated for such and recent biopsies were negative.  She continues to complain of abdominal gas and bloating despite taking  Nexium 40 mg a day.   I have sent her by the lab to check a celiac panel and I have placed her  on a low gas diet and I have decided to treat her with Align probiotic  therapy.  I have also asked her to try to avoid nonabsorbable  carbohydrates such as fructose, sorbitol, and sucralose.   We will continue to see her on a p.r.n. basis as needed with her primary  care physician Dr. Fabian Sharp.  The patient is up to date on her colonoscopy  and has a family history of colon cancer in an aunt.     Vania Rea. Jarold Motto, MD, Caleen Essex, FAGA  Electronically Signed    DRP/MedQ  DD: 06/15/2007  DT: 06/15/2007  Job #: [id number]

## 2010-06-29 NOTE — Assessment & Plan Note (Signed)
Better  

## 2010-06-29 NOTE — Progress Notes (Signed)
Subjective:    Patient ID: Jackie Lewis, female    DOB: 04/16/1955, 55 y.o.   MRN: [id number]  HPI Patient comes in today for CPX and fu of  medical issues .  No major change in health status since last visit .  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 He has been on home since January time and rehabbing at the neuro rehabilitation unit. Currently he is on a G-tube has a cranial shunt and is now beginning to walk and talk. She still has to care for his airway.  His been taking her thyroid medicine pretty regularly although has missed about 4 or 5 times in last month or 2. She has had no major changes in her health status. She did get itchy all over with a rash from the antibiotic we called him in the fall for her urinary tract infection.    Review of Systems Gi Gu ok  Sometimes  Joint pain.  No vaginal bleeding breast lumps major vision hearing changes change in bowel habits. No regular exercise but is getting out more recently no chest pain or shortness of breath. Has some varicose veins    Objective:   Physical Exam Physical Exam: Vital signs reviewed WUJ:WJXB is a well-developed well-nourished alert cooperative   female who appears her stated age in no acute distress.  HEENT: normocephalic  traumatic , Eyes: PERRL EOM's full, conjunctiva clear, Nares: paten,t no deformity discharge or tenderness., Ears: no deformity EAC's clear TMs with normal landmarks. Mouth: clear OP, no lesions, edema.  Moist mucous membranes. Dentition in adequate repair. NECK: supple without masses, thyromegaly or bruits. CHEST/PULM:  Clear to auscultation and percussion breath sounds equal no wheeze , rales or rhonchi. No chest wall deformities or tenderness. Breast: normal by inspection . No dimpling, discharge, masses, tenderness or discharge .  CV: PMI is nondisplaced, S1 S2 no gallops, murmurs, rubs. Peripheral  pulses are full without delay.No JVD .  ABDOMEN: Bowel sounds normal nontender  No guard or rebound, no hepato splenomegal no CVA tenderness.  No hernia. Extremtities:  No clubbing cyanosis or edema, no acute joint swelling or redness no focal atrophy some varicose veins no ulcers some spider veins on the right NEURO:  Oriented x3, cranial nerves 3-12 appear to be intact, no obvious focal weakness,gait within normal limits no abnormal reflexes or asymmetrical SKIN: No acute rashes normal turgor, color, no bruising or petechiae. PSYCH: Oriented, good eye contact, no obvious depression anxiety, cognition and judgment appear normal. Pelvic: NL ext GU, labia clear without lesions or rash . Vagina no lesions .Cervix: clear  UTERUS: Neg CMT  uln Adnexa:  clear no masses . PAP   Not done done Rectal hemorrhoid no masses   Labs reviewed  EKG NSR no acute changes     Assessment & Plan:  Preventive Health Care Up-to-date on immunizations mammogram EKG colonoscopy. Didn't get a flu shot last year and didn't get the flu. Thyroid  acceptable levels continue same stay on brand. Coupon sample 90 day prescription given call us if we need to rewrite the prescription Bp good  History of recurrent UTIs none recently apparently had a drug reaction to the last antibiotic given which we believe was Macrobid.  Family stress illness over the last year. Her son is starting to make more of her recovery on his brain injury. Discussed trying to keep her healthy lifestyle  for her.

## 2010-07-09 ENCOUNTER — Other Ambulatory Visit: Payer: Self-pay | Admitting: Internal Medicine

## 2010-07-09 DIAGNOSIS — Z1231 Encounter for screening mammogram for malignant neoplasm of breast: Secondary | ICD-10-CM

## 2010-08-17 ENCOUNTER — Ambulatory Visit: Payer: BC Managed Care – PPO

## 2010-08-24 ENCOUNTER — Ambulatory Visit
Admission: RE | Admit: 2010-08-24 | Discharge: 2010-08-24 | Disposition: A | Discharge status: Home / Self Care | Payer: BC Managed Care – PPO | Source: Ambulatory Visit | Attending: Internal Medicine | Admitting: Internal Medicine

## 2010-08-24 DIAGNOSIS — Z1231 Encounter for screening mammogram for malignant neoplasm of breast: Secondary | ICD-10-CM

## 2011-06-15 LAB — HM MAMMOGRAPHY: HM Mammogram: NORMAL

## 2011-06-24 ENCOUNTER — Other Ambulatory Visit (INDEPENDENT_AMBULATORY_CARE_PROVIDER_SITE_OTHER): Payer: BC Managed Care – PPO

## 2011-06-24 ENCOUNTER — Encounter: Payer: Self-pay | Admitting: Internal Medicine

## 2011-06-24 ENCOUNTER — Ambulatory Visit (INDEPENDENT_AMBULATORY_CARE_PROVIDER_SITE_OTHER): Payer: BC Managed Care – PPO | Admitting: Internal Medicine

## 2011-06-24 VITALS — BP 104/78 | HR 81 | Temp 97.9°F | Wt 196.0 lb

## 2011-06-24 DIAGNOSIS — N390 Urinary tract infection, site not specified: Secondary | ICD-10-CM

## 2011-06-24 DIAGNOSIS — Z0000 Encounter for general adult medical examination without abnormal findings: Secondary | ICD-10-CM

## 2011-06-24 LAB — BASIC METABOLIC PANEL
BUN: 22 mg/dL (ref 6–23)
Calcium: 9.7 mg/dL (ref 8.4–10.5)
Chloride: 101 mEq/L (ref 96–112)
Creatinine, Ser: 0.7 mg/dL (ref 0.4–1.2)

## 2011-06-24 LAB — HEPATIC FUNCTION PANEL
Bilirubin, Direct: 0.1 mg/dL (ref 0.0–0.3)
Total Bilirubin: 0.6 mg/dL (ref 0.3–1.2)

## 2011-06-24 LAB — LIPID PANEL
Cholesterol: 188 mg/dL (ref 0–200)
HDL: 75.2 mg/dL (ref >39.00)
VLDL: 21.2 mg/dL (ref 0.0–40.0)

## 2011-06-24 LAB — CBC WITH DIFFERENTIAL/PLATELET
Eosinophils Absolute: 0.1 10*3/uL (ref 0.0–0.7)
Eosinophils Relative: 2.3 % (ref 0.0–5.0)
MCHC: 33.4 g/dL (ref 30.0–36.0)
MCV: 86.6 fl (ref 78.0–100.0)
Monocytes Absolute: 0.4 10*3/uL (ref 0.1–1.0)
Neutrophils Relative %: 62.3 % (ref 43.0–77.0)
Platelets: 187 10*3/uL (ref 150.0–400.0)
WBC: 5.9 10*3/uL (ref 4.5–10.5)

## 2011-06-24 LAB — POCT URINALYSIS DIPSTICK
Bilirubin, UA: NEGATIVE
Glucose, UA: NEGATIVE
Ketones, UA: NEGATIVE

## 2011-06-24 MED ORDER — CIPROFLOXACIN HCL 500 MG PO TABS: 500.0000 mg | ORAL_TABLET | Freq: Two times a day (BID) | ORAL | Status: DC

## 2011-06-24 NOTE — Patient Instructions (Signed)
Will notify you  of labs when available.  take antibiotic and let us know if not getting better.

## 2011-06-24 NOTE — Progress Notes (Signed)
  Subjective:    Patient ID: Jackie Lewis, female    DOB: 03/11/1955, 56 y.o.   MRN: [id number]  HPI Patient comes in today for SDA for  new problem evaluation. She's worked in because she has been having urgency frequency and dysuria for 2 days. She came in for her routine lab work and her urinalysis was noted to be abnormal. She's had no fever or upper back pain or vomiting. She states this does feel like I typical urinary tract infection. Has not been on an antibiotic for while. Has to use the heating pad over her abdomen.  Review of Systems Negative for chest pain shortness of breath fever unusual rashes.  Son is doing better is able to talk and walk at this point. He still has a feeding tube.    Objective:   Physical Exam BP 104/78  Pulse 81  Temp(Src) 97.9 F (36.6 C) (Oral)  Wt 196 lb (88.905 kg)  SpO2 97% WDWN in nad Negative CVA tenderness Alert in no acute distress Reviewed UA 3+ leukocytes +1 blood negative protein.      Assessment & Plan:  UTI  acute history of same  Reviewed record no recent treatment.  Began on Cipro twice a day for 3 to 5 days. Urine culture ordered. We'll advise when results are back she is due to come in for her checkup anyway and we can follow her up.

## 2011-06-28 LAB — URINE CULTURE

## 2011-07-01 ENCOUNTER — Ambulatory Visit (INDEPENDENT_AMBULATORY_CARE_PROVIDER_SITE_OTHER): Payer: BC Managed Care – PPO | Admitting: Internal Medicine

## 2011-07-01 ENCOUNTER — Encounter: Payer: Self-pay | Admitting: Internal Medicine

## 2011-07-01 ENCOUNTER — Other Ambulatory Visit (HOSPITAL_COMMUNITY)
Admission: RE | Admit: 2011-07-01 | Discharge: 2011-07-01 | Disposition: A | Discharge status: Home / Self Care | Payer: BC Managed Care – PPO | Source: Ambulatory Visit | Attending: Internal Medicine | Admitting: Internal Medicine

## 2011-07-01 VITALS — BP 110/78 | HR 86 | Temp 98.0°F | Ht 61.0 in | Wt 195.0 lb

## 2011-07-01 DIAGNOSIS — Z01419 Encounter for gynecological examination (general) (routine) without abnormal findings: Secondary | ICD-10-CM | POA: Insufficient documentation

## 2011-07-01 DIAGNOSIS — Z1159 Encounter for screening for other viral diseases: Secondary | ICD-10-CM | POA: Insufficient documentation

## 2011-07-01 DIAGNOSIS — L290 Pruritus ani: Secondary | ICD-10-CM

## 2011-07-01 DIAGNOSIS — M25511 Pain in right shoulder: Secondary | ICD-10-CM

## 2011-07-01 DIAGNOSIS — Z0000 Encounter for general adult medical examination without abnormal findings: Secondary | ICD-10-CM

## 2011-07-01 DIAGNOSIS — M25519 Pain in unspecified shoulder: Secondary | ICD-10-CM

## 2011-07-01 DIAGNOSIS — E039 Hypothyroidism, unspecified: Secondary | ICD-10-CM

## 2011-07-01 MED ORDER — BALNEOL EX LOTN: 1.0000 "application " | TOPICAL_LOTION | CUTANEOUS | Status: DC | PRN

## 2011-07-01 NOTE — Patient Instructions (Addendum)
Continue same dose of Synthroid. Have the pharmacy contact us when you need a refill  We will set up a referral to orthopedist at [facility] about her shoulder.  Check with your insurance about shingles vaccine coverage  Will notify you when Pap smear/HPV screen is back.

## 2011-07-01 NOTE — Progress Notes (Signed)
Subjective:    Patient ID: Jackie Lewis, female    DOB: 06/30/1955, 56 y.o.   MRN: [id number]  HPI Patient comes in today for preventive visit and follow-up of medical issues. 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  Right shoulder hurts and wakes her up at times dec rom no injury but increase use with caretaking Review of Systems Right handed  No fever cp sob syncope sometimes gets rectal itching and asks for refill on wash that gi gave her no rectal bleeding.  No falling bleeding  Past history family history social history reviewed in the electronic medical record. Outpatient Encounter Prescriptions as of 07/01/2011  Medication Sig Dispense Refill  . Incontinent Wash (BALNEOL) LOTN Apply 1 application topically as needed.  1 Bottle  2  . DISCONTD: Incontinent Wash (BALNEOL) LOTN Apply topically.        . ciprofloxacin (CIPRO) 500 MG tablet Take 1 tablet (500 mg total) by mouth 2 (two) times daily.  10 tablet  0  . SYNTHROID 88 MCG tablet Take 1 tablet (88 mcg total) by mouth daily.  90 tablet  3       Objective:   Physical Exam BP 110/78  Pulse 86  Temp(Src) 98 F (36.7 C) (Oral)  Ht 5\' 1"  (1.549 m)  Wt 195 lb (88.451 kg)  BMI 36.84 kg/m2  SpO2 98% Physical Exam: Vital signs reviewed WUJ:WJXB is a well-developed well-nourished alert cooperative female who appears her stated age in no acute distress.  HEENT: normocephalic atraumatic , Eyes: PERRL EOM's full, conjunctiva clear, Nares: paten,t no deformity discharge or tenderness., Ears: no deformity EAC's clear TMs with normal landmarks. Mouth: clear OP, no lesions, edema.  Moist mucous membranes. Dentition in adequate repair. NECK: supple without masses, thyromegaly or bruits. CHEST/PULM:  Clear to auscultation and percussion breath sounds equal no wheeze , rales or rhonchi. No chest  wall deformities or tenderness. Breast: normal by inspection . No dimpling, discharge, masses, tenderness or discharge . CV: PMI is nondisplaced, S1 S2 no gallops, murmurs, rubs. Peripheral pulses are full without delay.No JVD .  ABDOMEN: Bowel sounds normal nontender  No guard or rebound, no hepato splenomegal no CVA tenderness.  No hernia. Extremtities:  Right shoulder  Some tenderness rc area  Pain on internal rotation and elevation  Grip ok  No clubbing cyanosis or edema, no acute joint swelling or redness no focal atrophy.  NEURO:  Oriented x3, cranial nerves 3-12 appear to be intact, no obvious focal weakness,gait within normal limits no abnormal reflexes or asymmetrical SKIN: No acute rashes normal turgor, color, no bruising or petechiae. PSYCH: Oriented, good eye contact, no obvious depression anxiety, cognition and judgment appear normal. LN: no cervical axillary inguinal adenopathy Pelvic: NL ext GU, labia clear without lesions or rash . Vagina no lesions .Cervix: clear  UTERUS: Neg CMT Adnexa:  clear no masses . PAP done rectal tags no mass stool negative     Lab Results  Component Value Date   WBC 5.9 06/24/2011   HGB 12.3 06/24/2011   HCT 37.0 06/24/2011   PLT 187.0 06/24/2011   GLUCOSE 65* 06/24/2011   CHOL 188 06/24/2011   TRIG 106.0 06/24/2011   HDL 75.20 06/24/2011   LDLDIRECT 113.9 06/22/2010   LDLCALC 92 06/24/2011   ALT 25 06/24/2011  AST 22 06/24/2011   NA 139 06/24/2011   K 4.2 06/24/2011   CL 101 06/24/2011   CREATININE 0.7 06/24/2011   BUN 22 06/24/2011   CO2 26 06/24/2011   TSH 3.42 06/24/2011   HGBA1C 5.4 07/17/2008   MICROALBUR 0.7 04/14/2006      Assessment & Plan:  Preventive Health Care Counseled regarding healthy nutrition, exercise, sleep, injury prevention, calcium vit d and healthy weight .utd check on  shingles vaccine cost. And come back when wishes this.pap done with HPV risk if ned can go 5 years  Work on weight reduction  Hypothyroid  Continue same samples  given brand and coupon Right shoulder  ? rc vs impingement  Care takes   Son and is dominent hand   Disc consult  referral    Expectant management. Recurrent utis better today Hx of rectal itching  Refill wash.

## 2011-07-07 NOTE — Progress Notes (Signed)
Quick Note:  Tell patient PAP is normal. HPV screen is negative . can delay pap to 5 years if having no problems ______

## 2011-07-25 ENCOUNTER — Other Ambulatory Visit: Payer: Self-pay | Admitting: Internal Medicine

## 2011-07-25 DIAGNOSIS — Z1231 Encounter for screening mammogram for malignant neoplasm of breast: Secondary | ICD-10-CM

## 2011-08-16 ENCOUNTER — Telehealth: Payer: Self-pay | Admitting: Internal Medicine

## 2011-08-16 ENCOUNTER — Other Ambulatory Visit: Payer: Self-pay | Admitting: Internal Medicine

## 2011-08-16 MED ORDER — CIPROFLOXACIN HCL 500 MG PO TABS: 500.0000 mg | ORAL_TABLET | Freq: Two times a day (BID) | ORAL | Status: DC

## 2011-08-16 NOTE — Telephone Encounter (Signed)
Ok to refill the cipro   (  If having fever or persistent sx then she will need OV)

## 2011-08-16 NOTE — Telephone Encounter (Signed)
I spoke to the pt.  Her symptoms started this morning.  She has frequency of urination, burning and pain.  She has taken some Tylenol but has had no relief.  She was last in on 07-01-11 for her physical.  Please advise.

## 2011-08-16 NOTE — Telephone Encounter (Signed)
Pt has another UTI and is req a refill of ciprofloxacin (CIPRO) 500 MG tablet to [facility] on [address].

## 2011-08-16 NOTE — Telephone Encounter (Signed)
Sent to [facility].  Pt notified by telephone.

## 2011-08-23 ENCOUNTER — Ambulatory Visit: Payer: BC Managed Care – PPO

## 2011-08-23 ENCOUNTER — Ambulatory Visit
Admission: RE | Admit: 2011-08-23 | Discharge: 2011-08-23 | Disposition: A | Discharge status: Home / Self Care | Payer: BC Managed Care – PPO | Source: Ambulatory Visit | Attending: Internal Medicine | Admitting: Internal Medicine

## 2011-08-23 DIAGNOSIS — Z1231 Encounter for screening mammogram for malignant neoplasm of breast: Secondary | ICD-10-CM

## 2011-09-12 ENCOUNTER — Other Ambulatory Visit: Payer: Self-pay | Admitting: Internal Medicine

## 2011-09-12 NOTE — Telephone Encounter (Signed)
Patient is requesting refill on synthroid.  She wants to use [facility] on [address] [phone])

## 2011-09-12 NOTE — Telephone Encounter (Signed)
Pts daughter called to check on status of getting refill of Synthroid called in to [facility] on [address] asap today. Pt is traveling out of the country tomorrow a.m. And has to pick up late this afternoon. Pls call when done.

## 2011-09-12 NOTE — Telephone Encounter (Signed)
Left message on voicemail letting the pt know her rx was sent to the pharmacy by [facility]scribe.

## 2011-09-12 NOTE — Telephone Encounter (Signed)
Patient called back to say she is traveling out of the country on 7/30 and will need the medication today.

## 2011-11-28 ENCOUNTER — Encounter: Payer: Self-pay | Admitting: Internal Medicine

## 2011-11-28 ENCOUNTER — Ambulatory Visit (INDEPENDENT_AMBULATORY_CARE_PROVIDER_SITE_OTHER): Payer: BC Managed Care – PPO | Admitting: Internal Medicine

## 2011-11-28 VITALS — BP 122/78 | HR 89 | Temp 98.2°F | Wt 198.0 lb

## 2011-11-28 DIAGNOSIS — L259 Unspecified contact dermatitis, unspecified cause: Secondary | ICD-10-CM

## 2011-11-28 DIAGNOSIS — L309 Dermatitis, unspecified: Secondary | ICD-10-CM

## 2011-11-28 DIAGNOSIS — Z862 Personal history of diseases of the blood and blood-forming organs and certain disorders involving the immune mechanism: Secondary | ICD-10-CM

## 2011-11-28 DIAGNOSIS — R21 Rash and other nonspecific skin eruption: Secondary | ICD-10-CM

## 2011-11-28 DIAGNOSIS — Z8639 Personal history of other endocrine, nutritional and metabolic disease: Secondary | ICD-10-CM

## 2011-11-28 MED ORDER — TRIAMCINOLONE ACETONIDE 0.025 % EX CREA: TOPICAL_CREAM | Freq: Two times a day (BID) | CUTANEOUS | Status: DC

## 2011-11-28 MED ORDER — FLUCONAZOLE 150 MG PO TABS: 150.0000 mg | ORAL_TABLET | Freq: Once | ORAL | Status: DC

## 2011-11-28 NOTE — Progress Notes (Signed)
  Subjective:    Patient ID: Jackie Lewis, female    DOB: Dec 08, 1955, 56 y.o.   MRN: [id number]  HPI Patient comes in today for SDA for  new problem evaluation. Onset about a week ago of itchy perineal rash that she tried miconazole and had burning and irritation with this  Calmoseptine helpful  But no healing this.  Using baby wipes uses fabric softener but no sig  Change topical on skin; DOve soap.  Now having itching rash antecubital right and right axilla area and base of neck.  No one at home has rash . No fever uti sx and no new meds .  No sig vag dc .  Review of Systems No fever cp sob  No hx psoriasis or eczema caretaking for  Brain injured son.  No incontinence now abd pain gets no rash on hands . No latex exposure.  Past history family history social history reviewed in the electronic medical record.  Outpatient Encounter Prescriptions as of 11/28/2011  Medication Sig Dispense Refill  . Cholecalciferol (VITAMIN D-3 PO) Take 1 tablet by mouth daily.      . Incontinent Wash (BALNEOL) LOTN Apply 1 application topically as needed.  1 Bottle  2  . SYNTHROID 88 MCG tablet TAKE 1 TABLET BY MOUTH ONCE A DAY  90 tablet  2  . DISCONTD: ciprofloxacin (CIPRO) 500 MG tablet Take 1 tablet (500 mg total) by mouth 2 (two) times daily.  10 tablet  0       Objective:   Physical Exam BP 122/78  Pulse 89  Temp 98.2 F (36.8 C) (Oral)  Wt 198 lb (89.812 kg)  SpO2 98% wdwn in nad  Perineum ;irritated  red indistinct patch area  and excoriated rash y and some scale and some follicular / no plaque ; also antecubital papules faint redness and right axilla ;  No burrows vesicles noted   Scalp no rash seen Hands clear no interdigital rash .     Assessment & Plan:  Itchy perineal rash   And now antecubital and righ axillary  Unusual distribution  Had irritation with otc miconazole  calmoseptim helps but continues . No systemic sx and no obv psoraisis and no burrowing like mites.    bg normal today for  Post prandial Will rx with oral diflucan and topical steroid and avoid too many irritants  If not getting better then plan fu and or other opinion.

## 2011-11-28 NOTE — Patient Instructions (Signed)
This acts like a skin sensitization allergic rash but possible fungus in the bottom area.  Avoid using the baby wipes.  Can use topical cortisone as we are giving you and oral pill for anti-fungus and yeast.  If this is persistent or progressive we can consider other treatments or get dermatology to see you.

## 2012-01-23 ENCOUNTER — Other Ambulatory Visit: Payer: Self-pay | Admitting: Internal Medicine

## 2012-03-05 ENCOUNTER — Other Ambulatory Visit: Payer: Self-pay | Admitting: Internal Medicine

## 2012-06-25 ENCOUNTER — Other Ambulatory Visit: Payer: BC Managed Care – PPO

## 2012-06-26 ENCOUNTER — Other Ambulatory Visit (INDEPENDENT_AMBULATORY_CARE_PROVIDER_SITE_OTHER): Payer: BC Managed Care – PPO

## 2012-06-26 ENCOUNTER — Telehealth: Payer: Self-pay | Admitting: Internal Medicine

## 2012-06-26 DIAGNOSIS — Z0000 Encounter for general adult medical examination without abnormal findings: Secondary | ICD-10-CM

## 2012-06-26 LAB — CBC WITH DIFFERENTIAL/PLATELET
Basophils Relative: 0.4 % (ref 0.0–3.0)
Eosinophils Absolute: 0.1 10*3/uL (ref 0.0–0.7)
Eosinophils Relative: 2 % (ref 0.0–5.0)
Hemoglobin: 12.9 g/dL (ref 12.0–15.0)
Lymphocytes Relative: 29.2 % (ref 12.0–46.0)
MCHC: 33.6 g/dL (ref 30.0–36.0)
MCV: 84.2 fl (ref 78.0–100.0)
Neutro Abs: 4.3 10*3/uL (ref 1.4–7.7)
RBC: 4.56 Mil/uL (ref 3.87–5.11)

## 2012-06-26 LAB — HEPATIC FUNCTION PANEL
Bilirubin, Direct: 0 mg/dL (ref 0.0–0.3)
Total Bilirubin: 0.7 mg/dL (ref 0.3–1.2)

## 2012-06-26 LAB — LIPID PANEL
Cholesterol: 182 mg/dL (ref 0–200)
LDL Cholesterol: 99 mg/dL (ref 0–99)
Total CHOL/HDL Ratio: 3

## 2012-06-26 LAB — BASIC METABOLIC PANEL
BUN: 19 mg/dL (ref 6–23)
CO2: 29 mEq/L (ref 19–32)
Chloride: 103 mEq/L (ref 96–112)
Creatinine, Ser: 0.7 mg/dL (ref 0.4–1.2)
Glucose, Bld: 80 mg/dL (ref 70–99)

## 2012-06-26 LAB — TSH: TSH: 2.36 u[IU]/mL (ref 0.35–5.50)

## 2012-06-26 MED ORDER — SYNTHROID 88 MCG PO TABS: ORAL_TABLET | ORAL | Status: DC

## 2012-06-26 NOTE — Telephone Encounter (Signed)
#  90 [facility] [address].

## 2012-06-26 NOTE — Telephone Encounter (Signed)
Pt is having her cpe next Monday, she says that she is out of Centroid. Can you refill this for her? She says that Dr. Fabian Sharp does not want her to miss any meds.

## 2012-06-26 NOTE — Telephone Encounter (Signed)
Pt aware.

## 2012-07-02 ENCOUNTER — Ambulatory Visit (INDEPENDENT_AMBULATORY_CARE_PROVIDER_SITE_OTHER): Payer: BC Managed Care – PPO | Admitting: Internal Medicine

## 2012-07-02 ENCOUNTER — Encounter: Payer: Self-pay | Admitting: Internal Medicine

## 2012-07-02 VITALS — BP 130/74 | HR 73 | Temp 98.3°F | Ht 61.5 in | Wt 198.0 lb

## 2012-07-02 DIAGNOSIS — H9190 Unspecified hearing loss, unspecified ear: Secondary | ICD-10-CM | POA: Insufficient documentation

## 2012-07-02 DIAGNOSIS — Z0000 Encounter for general adult medical examination without abnormal findings: Secondary | ICD-10-CM

## 2012-07-02 DIAGNOSIS — E039 Hypothyroidism, unspecified: Secondary | ICD-10-CM

## 2012-07-02 DIAGNOSIS — M65312 Trigger thumb, left thumb: Secondary | ICD-10-CM

## 2012-07-02 DIAGNOSIS — M65319 Trigger thumb, unspecified thumb: Secondary | ICD-10-CM | POA: Insufficient documentation

## 2012-07-02 DIAGNOSIS — M6530 Trigger finger, unspecified finger: Secondary | ICD-10-CM

## 2012-07-02 MED ORDER — SYNTHROID 88 MCG PO TABS: ORAL_TABLET | ORAL | Status: DC

## 2012-07-02 NOTE — Progress Notes (Signed)
Chief Complaint  Patient presents with  . Annual Exam    HPI: Patient comes in today for Preventive Health Care visit  Since her last visit no major changes in her health she still battling some hand problems. Joint problem knee ankle and trigger. Fingers  cts  No surgery and used meds and spplints. Right hand is better left thumb had an injection options are Surgery or medicine .   If not getting better she may have surgery on her left thumb Decreased hearing he waited at [facility] ; option of Hearing aids .  80 %  Loss  Left   .   And 30 % loss other. Doing it at this time. Extensive. Steroid cream worked the last time. Up-to-date on health care parameters Planning a trip to [place] to see relatives ROS:  GEN/ HEENT: No fever, significant weight changes sweats headaches vision problems  CV/ PULM; No chest pain shortness of breath cough, syncope,edema  change in exercise tolerance. GI /GU: No adominal pain, vomiting, change in bowel habits. No blood in the stool. No significant GU symptoms. SKIN/HEME: ,no acute skin rashes suspicious lesions or bleeding. No lymphadenopathy, nodules, masses.  NEURO/ PSYCH:  No neurologic signs such as weakness numbness. No depression anxiety. IMM/ Allergy: No unusual infections.  Allergy .   REST of 12 system review negative except as per HPI   Past Medical History  Diagnosis Date  . Headache   . Recurrent UTI   . Scleritis     hx  . Abnormal thyroid blood test   . Allergic rhinitis   . CTS (carpal tunnel syndrome)    Past Surgical History  Procedure Laterality Date  . Esophagogastroduodenoscopy  3/09    Family History  Problem Relation Age of Onset  . Stroke Father   . Parkinsonism Father   . Other Son     traumatic brain injury 2011  . Thyroid disease    . Diabetes type II Father   . Hypertension    . Hyperlipidemia      History   Social History  . Marital Status: Married    Spouse Name: N/A    Number of Children: N/A  . Years  of Education: N/A   Social History Main Topics  . Smoking status: Never Smoker   . Smokeless tobacco: None  . Alcohol Use: No  . Drug Use: No  . Sexually Active:    Other Topics Concern  . 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    [place] country of origin   Bereaved parent   Son  speaking some still has g tube      Hh  of 4  . Daughter graduated from [employer] and is back home this month. To go to grad school in [employer]       [id number]    Outpatient Encounter Prescriptions as of 07/02/2012  Medication Sig Dispense Refill  . Incontinent Wash (BALNEOL) LOTN Apply 1 application topically as needed.  1 Bottle  2  . SYNTHROID 88 MCG tablet TAKE 1 TABLET BY MOUTH ONCE A DAY  90 tablet  3  . triamcinolone (KENALOG) 0.025 % cream APPLY TO AFFECTED AREA TWICE DAILY AS DIRECTED  80 g  0  . [DISCONTINUED] SYNTHROID 88 MCG tablet TAKE 1 TABLET BY MOUTH ONCE A DAY  90 tablet  0  . Cholecalciferol (VITAMIN D-3 PO) Take 1 tablet by mouth daily.      . [DISCONTINUED] fluconazole (DIFLUCAN) 150 MG tablet Take 1 tablet (150 mg total) by mouth once. Repeat in 3 days  2 tablet  0   No facility-administered encounter medications on file as of 07/02/2012.    EXAM:  BP 130/74  Pulse 73  Temp(Src) 98.3 F (36.8 C) (Oral)  Ht 5' 1.5" (1.562 m)  Wt 198 lb (89.812 kg)  BMI 36.81 kg/m2  SpO2 98%  Body mass index is 36.81 kg/(m^2).  Physical Exam: Vital signs reviewed [id number] is a well-developed well-nourished alert cooperative   female who appears her stated age in no acute distress.  HEENT: normocephalic atraumatic , Eyes: PERRL EOM's full, conjunctiva clear, Nares: paten,t no deformity discharge or tenderness., Ears: no deformity EAC's clear TMs with normal landmarks. Mouth: clear OP, no lesions, edema.   Moist mucous membranes. Dentition in adequate repair. NECK: supple without masses, thyromegaly or bruits. CHEST/PULM:  Clear to auscultation and percussion breath sounds equal no wheeze , rales or rhonchi. No chest wall deformities or tenderness. Breast: normal by inspection . No dimpling, discharge, masses, tenderness or discharge . CV: PMI is nondisplaced, S1 S2 no gallops, murmurs, rubs. Peripheral pulses are full without delay.No JVD .  ABDOMEN: Bowel sounds normal nontender  No guard or rebound, no hepato splenomegal no CVA tenderness.  No hernia. Extremtities:  No clubbing cyanosis or edema, no acute joint swelling or redness no focal atrophy left thumb triggers right knee no edema  NEURO:  Oriented x3, cranial nerves 3-12 appear to be intact, no obvious focal weakness,gait within normal limits no abnormal reflexes or asymmetrical SKIN: No acute rashes normal turgor, color, no bruising or petechiae. PSYCH: Oriented, good eye contact, no obvious depression anxiety, cognition and judgment appear normal. LN: no cervical axillary inguinal adenopathy  Lab Results  Component Value Date   WBC 6.8 06/26/2012   HGB 12.9 06/26/2012   HCT 38.4 06/26/2012   PLT 227.0 06/26/2012   GLUCOSE 80 06/26/2012   CHOL 182 06/26/2012   TRIG 110.0 06/26/2012   HDL 61.20 06/26/2012   LDLDIRECT 113.9 06/22/2010   LDLCALC 99 06/26/2012   ALT 27 06/26/2012   AST 19 06/26/2012   NA 139 06/26/2012   K 4.5 06/26/2012   CL 103 06/26/2012   CREATININE 0.7 06/26/2012   BUN 19 06/26/2012   CO2 29 06/26/2012   TSH 2.36 06/26/2012   HGBA1C 5.4 07/17/2008   MICROALBUR 0.7 04/14/2006    ASSESSMENT AND PLAN:  Discussed the following assessment and plan:  Visit for preventive health examination  Unspecified hypothyroidism - Maintain same dose samples of branded Synthroid with coupon changed [employer]  Trigger thumb, left  Hearing loss, unspecified laterality - left more than right [facility]   80 30 % Counseled regarding healthy  nutrition, exercise, sleep, injury prevention, calcium vit d and healthy weight . Still caretaking for brain injured son who was never expected to survive. Or be functional.  Good shoes setc right knee pain   Fu if  persistent or progressive or at ortho could be early arthritis   Maintain muscle mass and activity  Patient Care Team: Madelin Headings, MD as PCP - General ent Patient Instructions  Continue lifestyle intervention healthy eating and exercise . Continue same synthroid dose. Good shoes avoid squatting  keep muscle strength around knee Yearly preventive visit.    Preventive Care for Adults, Female A healthy lifestyle and preventive care can promote health and wellness. Preventive health guidelines for women include the following key practices.  A routine yearly physical is a good way to check with your caregiver about your health and preventive screening. It is a chance to share any concerns and updates on your health, and to receive a thorough exam.  Visit your dentist for a routine exam and preventive care every 6 months. Brush your teeth twice a day and floss once a day. Good oral hygiene prevents tooth decay and gum disease.  The frequency of eye exams is based on your age, health, family medical history, use of contact lenses, and other factors. Follow your caregiver's recommendations for frequency of eye exams.  Eat a healthy diet. Foods like vegetables, fruits, whole grains, low-fat dairy products, and lean protein foods contain the nutrients you need without too many calories. Decrease your intake of foods high in solid fats, added sugars, and salt. Eat the right amount of calories for you.Get information about a proper diet from your caregiver, if necessary.  Regular physical exercise is one of the most important things you can do for your health. Most adults should get at least 150 minutes of moderate-intensity exercise (any activity that increases your heart rate and  causes you to sweat) each week. In addition, most adults need muscle-strengthening exercises on 2 or more days a week.  Maintain a healthy weight. The body mass index (BMI) is a screening tool to identify possible weight problems. It provides an estimate of body fat based on height and weight. Your caregiver can help determine your BMI, and can help you achieve or maintain a healthy weight.For adults 20 years and older:  A BMI below 18.5 is considered underweight.  A BMI of 18.5 to 24.9 is normal.  A BMI of 25 to 29.9 is considered overweight.  A BMI of 30 and above is considered obese.  Maintain normal blood lipids and cholesterol levels by exercising and minimizing your intake of saturated fat. Eat a balanced diet with plenty of fruit and vegetables. Blood tests for lipids and cholesterol should begin at age 58 and be repeated every 5 years. If your lipid or cholesterol levels are high, you are over 50, or you are at high risk for heart disease, you may need your cholesterol levels checked more frequently.Ongoing high lipid and cholesterol levels should be treated with medicines if diet and exercise are not effective.  If you smoke, find out from your caregiver how to quit. If you do not use tobacco, do not start.  If you are pregnant, do not drink alcohol. If you are breastfeeding, be very cautious about drinking alcohol. If you are not pregnant and choose to drink alcohol, do not exceed 1 drink per day. One drink is considered to be 12 ounces (355 mL) of beer, 5 ounces (148 mL) of wine, or 1.5 ounces (44 mL) of liquor.  Avoid use of street drugs. Do not share needles with anyone. Ask for help if you need support or instructions about stopping the use of drugs.  High blood pressure causes heart disease and increases the risk of stroke. Your blood pressure should be checked at least every 1 to 2 years. Ongoing high blood pressure should be treated with medicines if weight loss and exercise  are not effective.  If you are 43 to 58 years old, ask  your caregiver if you should take aspirin to prevent strokes.  Diabetes screening involves taking a blood sample to check your fasting blood sugar level. This should be done once every 3 years, after age 22, if you are within normal weight and without risk factors for diabetes. Testing should be considered at a younger age or be carried out more frequently if you are overweight and have at least 1 risk factor for diabetes.  Breast cancer screening is essential preventive care for women. You should practice "breast self-awareness." This means understanding the normal appearance and feel of your breasts and may include breast self-examination. Any changes detected, no matter how small, should be reported to a caregiver. Women in their 62s and 30s should have a clinical breast exam (CBE) by a caregiver as part of a regular health exam every 1 to 3 years. After age 4, women should have a CBE every year. Starting at age 26, women should consider having a mammography (breast X-ray test) every year. Women who have a family history of breast cancer should talk to their caregiver about genetic screening. Women at a high risk of breast cancer should talk to their caregivers about having magnetic resonance imaging (MRI) and a mammography every year.  The Pap test is a screening test for cervical cancer. A Pap test can show cell changes on the cervix that might become cervical cancer if left untreated. A Pap test is a procedure in which cells are obtained and examined from the lower end of the uterus (cervix).  Women should have a Pap test starting at age 63.  Between ages 53 and 48, Pap tests should be repeated every 2 years.  Beginning at age 63, you should have a Pap test every 3 years as long as the past 3 Pap tests have been normal.  Some women have medical problems that increase the chance of getting cervical cancer. Talk to your caregiver about these  problems. It is especially important to talk to your caregiver if a new problem develops soon after your last Pap test. In these cases, your caregiver may recommend more frequent screening and Pap tests.  The above recommendations are the same for women who have or have not gotten the vaccine for human papillomavirus (HPV).  If you had a hysterectomy for a problem that was not cancer or a condition that could lead to cancer, then you no longer need Pap tests. Even if you no longer need a Pap test, a regular exam is a good idea to make sure no other problems are starting.  If you are between ages 11 and 73, and you have had normal Pap tests going back 10 years, you no longer need Pap tests. Even if you no longer need a Pap test, a regular exam is a good idea to make sure no other problems are starting.  If you have had past treatment for cervical cancer or a condition that could lead to cancer, you need Pap tests and screening for cancer for at least 20 years after your treatment.  If Pap tests have been discontinued, risk factors (such as a new sexual partner) need to be reassessed to determine if screening should be resumed.  The HPV test is an additional test that may be used for cervical cancer screening. The HPV test looks for the virus that can cause the cell changes on the cervix. The cells collected during the Pap test can be tested for HPV. The HPV test could be  used to screen women aged 59 years and older, and should be used in women of any age who have unclear Pap test results. After the age of 63, women should have HPV testing at the same frequency as a Pap test.  Colorectal cancer can be detected and often prevented. Most routine colorectal cancer screening begins at the age of 81 and continues through age 70. However, your caregiver may recommend screening at an earlier age if you have risk factors for colon cancer. On a yearly basis, your caregiver may provide home test kits to check for  hidden blood in the stool. Use of a small camera at the end of a tube, to directly examine the colon (sigmoidoscopy or colonoscopy), can detect the earliest forms of colorectal cancer. Talk to your caregiver about this at age 71, when routine screening begins. Direct examination of the colon should be repeated every 5 to 10 years through age 76, unless early forms of pre-cancerous polyps or small growths are found.  Hepatitis C blood testing is recommended for all people born from [birth year] through 1965 and any individual with known risks for hepatitis C.  Practice safe sex. Use condoms and avoid high-risk sexual practices to reduce the spread of sexually transmitted infections (STIs). STIs include gonorrhea, chlamydia, syphilis, trichomonas, herpes, HPV, and human immunodeficiency virus (HIV). Herpes, HIV, and HPV are viral illnesses that have no cure. They can result in disability, cancer, and death. Sexually active women aged 24 and younger should be checked for chlamydia. Older women with new or multiple partners should also be tested for chlamydia. Testing for other STIs is recommended if you are sexually active and at increased risk.  Osteoporosis is a disease in which the bones lose minerals and strength with aging. This can result in serious bone fractures. The risk of osteoporosis can be identified using a bone density scan. Women ages 38 and over and women at risk for fractures or osteoporosis should discuss screening with their caregivers. Ask your caregiver whether you should take a calcium supplement or vitamin D to reduce the rate of osteoporosis.  Menopause can be associated with physical symptoms and risks. Hormone replacement therapy is available to decrease symptoms and risks. You should talk to your caregiver about whether hormone replacement therapy is right for you.  Use sunscreen with sun protection factor (SPF) of 30 or more. Apply sunscreen liberally and repeatedly throughout the day.  You should seek shade when your shadow is shorter than you. Protect yourself by wearing long sleeves, pants, a wide-brimmed hat, and sunglasses year round, whenever you are outdoors.  Once a month, do a whole body skin exam, using a mirror to look at the skin on your back. Notify your caregiver of new moles, moles that have irregular borders, moles that are larger than a pencil eraser, or moles that have changed in shape or color.  Stay current with required immunizations.  Influenza. You need a dose every fall (or winter). The composition of the flu vaccine changes each year, so being vaccinated once is not enough.  Pneumococcal polysaccharide. You need 1 to 2 doses if you smoke cigarettes or if you have certain chronic medical conditions. You need 1 dose at age 58 (or older) if you have never been vaccinated.  Tetanus, diphtheria, pertussis (Tdap, Td). Get 1 dose of Tdap vaccine if you are younger than age 50, are over 52 and have contact with an infant, are a Research scientist (physical sciences), are pregnant, or simply want to  be protected from whooping cough. After that, you need a Td booster dose every 10 years. Consult your caregiver if you have not had at least 3 tetanus and diphtheria-containing shots sometime in your life or have a deep or dirty wound.  HPV. You need this vaccine if you are a woman age 108 or younger. The vaccine is given in 3 doses over 6 months.  Measles, mumps, rubella (MMR). You need at least 1 dose of MMR if you were born in 1957 or later. You may also need a second dose.  Meningococcal. If you are age 35 to 12 and a first-year college student living in a residence hall, or have one of several medical conditions, you need to get vaccinated against meningococcal disease. You may also need additional booster doses.  Zoster (shingles). If you are age 50 or older, you should get this vaccine.  Varicella (chickenpox). If you have never had chickenpox or you were vaccinated but received  only 1 dose, talk to your caregiver to find out if you need this vaccine.  Hepatitis A. You need this vaccine if you have a specific risk factor for hepatitis A virus infection or you simply wish to be protected from this disease. The vaccine is usually given as 2 doses, 6 to 18 months apart.  Hepatitis B. You need this vaccine if you have a specific risk factor for hepatitis B virus infection or you simply wish to be protected from this disease. The vaccine is given in 3 doses, usually over 6 months. Preventive Services / Frequency Ages 49 to 69  Blood pressure check.** / Every 1 to 2 years.  Lipid and cholesterol check.** / Every 5 years beginning at age 31.  Clinical breast exam.** / Every 3 years for women in their 55s and 30s.  Pap test.** / Every 2 years from ages 60 through 5. Every 3 years starting at age 27 through age 51 or 15 with a history of 3 consecutive normal Pap tests.  HPV screening.** / Every 3 years from ages 32 through ages 63 to 18 with a history of 3 consecutive normal Pap tests.  Hepatitis C blood test.** / For any individual with known risks for hepatitis C.  Skin self-exam. / Monthly.  Influenza immunization.** / Every year.  Pneumococcal polysaccharide immunization.** / 1 to 2 doses if you smoke cigarettes or if you have certain chronic medical conditions.  Tetanus, diphtheria, pertussis (Tdap, Td) immunization. / A one-time dose of Tdap vaccine. After that, you need a Td booster dose every 10 years.  HPV immunization. / 3 doses over 6 months, if you are 34 and younger.  Measles, mumps, rubella (MMR) immunization. / You need at least 1 dose of MMR if you were born in 1957 or later. You may also need a second dose.  Meningococcal immunization. / 1 dose if you are age 16 to 65 and a first-year college student living in a residence hall, or have one of several medical conditions, you need to get vaccinated against meningococcal disease. You may also need  additional booster doses.  Varicella immunization.** / Consult your caregiver.  Hepatitis A immunization.** / Consult your caregiver. 2 doses, 6 to 18 months apart.  Hepatitis B immunization.** / Consult your caregiver. 3 doses usually over 6 months. Ages 27 to 15  Blood pressure check.** / Every 1 to 2 years.  Lipid and cholesterol check.** / Every 5 years beginning at age 61.  Clinical breast exam.** / Every year  after age 45.  Mammogram.** / Every year beginning at age 69 and continuing for as long as you are in good health. Consult with your caregiver.  Pap test.** / Every 3 years starting at age 53 through age 53 or 27 with a history of 3 consecutive normal Pap tests.  HPV screening.** / Every 3 years from ages 31 through ages 60 to 18 with a history of 3 consecutive normal Pap tests.  Fecal occult blood test (FOBT) of stool. / Every year beginning at age 22 and continuing until age 45. You may not need to do this test if you get a colonoscopy every 10 years.  Flexible sigmoidoscopy or colonoscopy.** / Every 5 years for a flexible sigmoidoscopy or every 10 years for a colonoscopy beginning at age 50 and continuing until age 72.  Hepatitis C blood test.** / For all people born from [birth year] through 1966 and any individual with known risks for hepatitis C.  Skin self-exam. / Monthly.  Influenza immunization.** / Every year.  Pneumococcal polysaccharide immunization.** / 1 to 2 doses if you smoke cigarettes or if you have certain chronic medical conditions.  Tetanus, diphtheria, pertussis (Tdap, Td) immunization.** / A one-time dose of Tdap vaccine. After that, you need a Td booster dose every 10 years.  Measles, mumps, rubella (MMR) immunization. / You need at least 1 dose of MMR if you were born in 1957 or later. You may also need a second dose.  Varicella immunization.** / Consult your caregiver.  Meningococcal immunization.** / Consult your caregiver.  Hepatitis A  immunization.** / Consult your caregiver. 2 doses, 6 to 18 months apart.  Hepatitis B immunization.** / Consult your caregiver. 3 doses, usually over 6 months. Ages 62 and over  Blood pressure check.** / Every 1 to 2 years.  Lipid and cholesterol check.** / Every 5 years beginning at age 73.  Clinical breast exam.** / Every year after age 53.  Mammogram.** / Every year beginning at age 44 and continuing for as long as you are in good health. Consult with your caregiver.  Pap test.** / Every 3 years starting at age 89 through age 17 or 58 with a 3 consecutive normal Pap tests. Testing can be stopped between 65 and 70 with 3 consecutive normal Pap tests and no abnormal Pap or HPV tests in the past 10 years.  HPV screening.** / Every 3 years from ages 5 through ages 9 or 39 with a history of 3 consecutive normal Pap tests. Testing can be stopped between 65 and 70 with 3 consecutive normal Pap tests and no abnormal Pap or HPV tests in the past 10 years.  Fecal occult blood test (FOBT) of stool. / Every year beginning at age 44 and continuing until age 11. You may not need to do this test if you get a colonoscopy every 10 years.  Flexible sigmoidoscopy or colonoscopy.** / Every 5 years for a flexible sigmoidoscopy or every 10 years for a colonoscopy beginning at age 28 and continuing until age 31.  Hepatitis C blood test.** / For all people born from [birth year] through 1966 and any individual with known risks for hepatitis C.  Osteoporosis screening.** / A one-time screening for women ages 20 and over and women at risk for fractures or osteoporosis.  Skin self-exam. / Monthly.  Influenza immunization.** / Every year.  Pneumococcal polysaccharide immunization.** / 1 dose at age 69 (or older) if you have never been vaccinated.  Tetanus, diphtheria, pertussis (Tdap, Td) immunization. /  A one-time dose of Tdap vaccine if you are over 65 and have contact with an infant, are a Research scientist (physical sciences), or  simply want to be protected from whooping cough. After that, you need a Td booster dose every 10 years.  Varicella immunization.** / Consult your caregiver.  Meningococcal immunization.** / Consult your caregiver.  Hepatitis A immunization.** / Consult your caregiver. 2 doses, 6 to 18 months apart.  Hepatitis B immunization.** / Check with your caregiver. 3 doses, usually over 6 months. ** Family history and personal history of risk and conditions may change your caregiver's recommendations. Document Released: 03/29/2001 Document Revised: 04/25/2011 Document Reviewed: 06/28/2010 [facility] Patient Information 2013 [place], [place].       Neta Mends. Panosh M.D.  Health Maintenance  Topic Date Due  . Influenza Vaccine  10/15/2012  . Mammogram  08/22/2013  . Pap Smear  07/01/2014  . Colonoscopy  05/03/2015  . Tetanus/tdap  05/30/2018   Health Maintenance Review }

## 2012-07-02 NOTE — Patient Instructions (Signed)
Continue lifestyle intervention healthy eating and exercise . Continue same synthroid dose. Good shoes avoid squatting keep muscle strength around knee Yearly preventive visit.    Preventive Care for Adults, Female A healthy lifestyle and preventive care can promote health and wellness. Preventive health guidelines for women include the following key practices.  A routine yearly physical is a good way to check with your caregiver about your health and preventive screening. It is a chance to share any concerns and updates on your health, and to receive a thorough exam.  Visit your dentist for a routine exam and preventive care every 6 months. Brush your teeth twice a day and floss once a day. Good oral hygiene prevents tooth decay and gum disease.  The frequency of eye exams is based on your age, health, family medical history, use of contact lenses, and other factors. Follow your caregiver's recommendations for frequency of eye exams.  Eat a healthy diet. Foods like vegetables, fruits, whole grains, low-fat dairy products, and lean protein foods contain the nutrients you need without too many calories. Decrease your intake of foods high in solid fats, added sugars, and salt. Eat the right amount of calories for you.Get information about a proper diet from your caregiver, if necessary.  Regular physical exercise is one of the most important things you can do for your health. Most adults should get at least 150 minutes of moderate-intensity exercise (any activity that increases your heart rate and causes you to sweat) each week. In addition, most adults need muscle-strengthening exercises on 2 or more days a week.  Maintain a healthy weight. The body mass index (BMI) is a screening tool to identify possible weight problems. It provides an estimate of body fat based on height and weight. Your caregiver can help determine your BMI, and can help you achieve or maintain a healthy weight.For adults 20  years and older:  A BMI below 18.5 is considered underweight.  A BMI of 18.5 to 24.9 is normal.  A BMI of 25 to 29.9 is considered overweight.  A BMI of 30 and above is considered obese.  Maintain normal blood lipids and cholesterol levels by exercising and minimizing your intake of saturated fat. Eat a balanced diet with plenty of fruit and vegetables. Blood tests for lipids and cholesterol should begin at age 83 and be repeated every 5 years. If your lipid or cholesterol levels are high, you are over 50, or you are at high risk for heart disease, you may need your cholesterol levels checked more frequently.Ongoing high lipid and cholesterol levels should be treated with medicines if diet and exercise are not effective.  If you smoke, find out from your caregiver how to quit. If you do not use tobacco, do not start.  If you are pregnant, do not drink alcohol. If you are breastfeeding, be very cautious about drinking alcohol. If you are not pregnant and choose to drink alcohol, do not exceed 1 drink per day. One drink is considered to be 12 ounces (355 mL) of beer, 5 ounces (148 mL) of wine, or 1.5 ounces (44 mL) of liquor.  Avoid use of street drugs. Do not share needles with anyone. Ask for help if you need support or instructions about stopping the use of drugs.  High blood pressure causes heart disease and increases the risk of stroke. Your blood pressure should be checked at least every 1 to 2 years. Ongoing high blood pressure should be treated with medicines if weight loss  and exercise are not effective.  If you are 75 to 58 years old, ask your caregiver if you should take aspirin to prevent strokes.  Diabetes screening involves taking a blood sample to check your fasting blood sugar level. This should be done once every 3 years, after age 37, if you are within normal weight and without risk factors for diabetes. Testing should be considered at a younger age or be carried out more  frequently if you are overweight and have at least 1 risk factor for diabetes.  Breast cancer screening is essential preventive care for women. You should practice "breast self-awareness." This means understanding the normal appearance and feel of your breasts and may include breast self-examination. Any changes detected, no matter how small, should be reported to a caregiver. Women in their 34s and 30s should have a clinical breast exam (CBE) by a caregiver as part of a regular health exam every 1 to 3 years. After age 70, women should have a CBE every year. Starting at age 36, women should consider having a mammography (breast X-ray test) every year. Women who have a family history of breast cancer should talk to their caregiver about genetic screening. Women at a high risk of breast cancer should talk to their caregivers about having magnetic resonance imaging (MRI) and a mammography every year.  The Pap test is a screening test for cervical cancer. A Pap test can show cell changes on the cervix that might become cervical cancer if left untreated. A Pap test is a procedure in which cells are obtained and examined from the lower end of the uterus (cervix).  Women should have a Pap test starting at age 73.  Between ages 33 and 9, Pap tests should be repeated every 2 years.  Beginning at age 49, you should have a Pap test every 3 years as long as the past 3 Pap tests have been normal.  Some women have medical problems that increase the chance of getting cervical cancer. Talk to your caregiver about these problems. It is especially important to talk to your caregiver if a new problem develops soon after your last Pap test. In these cases, your caregiver may recommend more frequent screening and Pap tests.  The above recommendations are the same for women who have or have not gotten the vaccine for human papillomavirus (HPV).  If you had a hysterectomy for a problem that was not cancer or a condition  that could lead to cancer, then you no longer need Pap tests. Even if you no longer need a Pap test, a regular exam is a good idea to make sure no other problems are starting.  If you are between ages 62 and 22, and you have had normal Pap tests going back 10 years, you no longer need Pap tests. Even if you no longer need a Pap test, a regular exam is a good idea to make sure no other problems are starting.  If you have had past treatment for cervical cancer or a condition that could lead to cancer, you need Pap tests and screening for cancer for at least 20 years after your treatment.  If Pap tests have been discontinued, risk factors (such as a new sexual partner) need to be reassessed to determine if screening should be resumed.  The HPV test is an additional test that may be used for cervical cancer screening. The HPV test looks for the virus that can cause the cell changes on the cervix. The  cells collected during the Pap test can be tested for HPV. The HPV test could be used to screen women aged 35 years and older, and should be used in women of any age who have unclear Pap test results. After the age of 79, women should have HPV testing at the same frequency as a Pap test.  Colorectal cancer can be detected and often prevented. Most routine colorectal cancer screening begins at the age of 60 and continues through age 10. However, your caregiver may recommend screening at an earlier age if you have risk factors for colon cancer. On a yearly basis, your caregiver may provide home test kits to check for hidden blood in the stool. Use of a small camera at the end of a tube, to directly examine the colon (sigmoidoscopy or colonoscopy), can detect the earliest forms of colorectal cancer. Talk to your caregiver about this at age 89, when routine screening begins. Direct examination of the colon should be repeated every 5 to 10 years through age 58, unless early forms of pre-cancerous polyps or small  growths are found.  Hepatitis C blood testing is recommended for all people born from [birth year] through 1965 and any individual with known risks for hepatitis C.  Practice safe sex. Use condoms and avoid high-risk sexual practices to reduce the spread of sexually transmitted infections (STIs). STIs include gonorrhea, chlamydia, syphilis, trichomonas, herpes, HPV, and human immunodeficiency virus (HIV). Herpes, HIV, and HPV are viral illnesses that have no cure. They can result in disability, cancer, and death. Sexually active women aged 51 and younger should be checked for chlamydia. Older women with new or multiple partners should also be tested for chlamydia. Testing for other STIs is recommended if you are sexually active and at increased risk.  Osteoporosis is a disease in which the bones lose minerals and strength with aging. This can result in serious bone fractures. The risk of osteoporosis can be identified using a bone density scan. Women ages 3 and over and women at risk for fractures or osteoporosis should discuss screening with their caregivers. Ask your caregiver whether you should take a calcium supplement or vitamin D to reduce the rate of osteoporosis.  Menopause can be associated with physical symptoms and risks. Hormone replacement therapy is available to decrease symptoms and risks. You should talk to your caregiver about whether hormone replacement therapy is right for you.  Use sunscreen with sun protection factor (SPF) of 30 or more. Apply sunscreen liberally and repeatedly throughout the day. You should seek shade when your shadow is shorter than you. Protect yourself by wearing long sleeves, pants, a wide-brimmed hat, and sunglasses year round, whenever you are outdoors.  Once a month, do a whole body skin exam, using a mirror to look at the skin on your back. Notify your caregiver of new moles, moles that have irregular borders, moles that are larger than a pencil eraser, or moles  that have changed in shape or color.  Stay current with required immunizations.  Influenza. You need a dose every fall (or winter). The composition of the flu vaccine changes each year, so being vaccinated once is not enough.  Pneumococcal polysaccharide. You need 1 to 2 doses if you smoke cigarettes or if you have certain chronic medical conditions. You need 1 dose at age 70 (or older) if you have never been vaccinated.  Tetanus, diphtheria, pertussis (Tdap, Td). Get 1 dose of Tdap vaccine if you are younger than age 66, are over 26  and have contact with an infant, are a Research scientist (physical sciences), are pregnant, or simply want to be protected from whooping cough. After that, you need a Td booster dose every 10 years. Consult your caregiver if you have not had at least 3 tetanus and diphtheria-containing shots sometime in your life or have a deep or dirty wound.  HPV. You need this vaccine if you are a woman age 93 or younger. The vaccine is given in 3 doses over 6 months.  Measles, mumps, rubella (MMR). You need at least 1 dose of MMR if you were born in 1957 or later. You may also need a second dose.  Meningococcal. If you are age 55 to 52 and a first-year college student living in a residence hall, or have one of several medical conditions, you need to get vaccinated against meningococcal disease. You may also need additional booster doses.  Zoster (shingles). If you are age 63 or older, you should get this vaccine.  Varicella (chickenpox). If you have never had chickenpox or you were vaccinated but received only 1 dose, talk to your caregiver to find out if you need this vaccine.  Hepatitis A. You need this vaccine if you have a specific risk factor for hepatitis A virus infection or you simply wish to be protected from this disease. The vaccine is usually given as 2 doses, 6 to 18 months apart.  Hepatitis B. You need this vaccine if you have a specific risk factor for hepatitis B virus infection or  you simply wish to be protected from this disease. The vaccine is given in 3 doses, usually over 6 months. Preventive Services / Frequency Ages 10 to 21  Blood pressure check.** / Every 1 to 2 years.  Lipid and cholesterol check.** / Every 5 years beginning at age 8.  Clinical breast exam.** / Every 3 years for women in their 35s and 30s.  Pap test.** / Every 2 years from ages 37 through 42. Every 3 years starting at age 19 through age 60 or 68 with a history of 3 consecutive normal Pap tests.  HPV screening.** / Every 3 years from ages 9 through ages 67 to 106 with a history of 3 consecutive normal Pap tests.  Hepatitis C blood test.** / For any individual with known risks for hepatitis C.  Skin self-exam. / Monthly.  Influenza immunization.** / Every year.  Pneumococcal polysaccharide immunization.** / 1 to 2 doses if you smoke cigarettes or if you have certain chronic medical conditions.  Tetanus, diphtheria, pertussis (Tdap, Td) immunization. / A one-time dose of Tdap vaccine. After that, you need a Td booster dose every 10 years.  HPV immunization. / 3 doses over 6 months, if you are 71 and younger.  Measles, mumps, rubella (MMR) immunization. / You need at least 1 dose of MMR if you were born in 1957 or later. You may also need a second dose.  Meningococcal immunization. / 1 dose if you are age 59 to 58 and a first-year college student living in a residence hall, or have one of several medical conditions, you need to get vaccinated against meningococcal disease. You may also need additional booster doses.  Varicella immunization.** / Consult your caregiver.  Hepatitis A immunization.** / Consult your caregiver. 2 doses, 6 to 18 months apart.  Hepatitis B immunization.** / Consult your caregiver. 3 doses usually over 6 months. Ages 20 to 90  Blood pressure check.** / Every 1 to 2 years.  Lipid and cholesterol check.** /  Every 5 years beginning at age 98.  Clinical  breast exam.** / Every year after age 50.  Mammogram.** / Every year beginning at age 61 and continuing for as long as you are in good health. Consult with your caregiver.  Pap test.** / Every 3 years starting at age 56 through age 6 or 88 with a history of 3 consecutive normal Pap tests.  HPV screening.** / Every 3 years from ages 36 through ages 47 to 52 with a history of 3 consecutive normal Pap tests.  Fecal occult blood test (FOBT) of stool. / Every year beginning at age 7 and continuing until age 62. You may not need to do this test if you get a colonoscopy every 10 years.  Flexible sigmoidoscopy or colonoscopy.** / Every 5 years for a flexible sigmoidoscopy or every 10 years for a colonoscopy beginning at age 49 and continuing until age 32.  Hepatitis C blood test.** / For all people born from [birth year] through 1966 and any individual with known risks for hepatitis C.  Skin self-exam. / Monthly.  Influenza immunization.** / Every year.  Pneumococcal polysaccharide immunization.** / 1 to 2 doses if you smoke cigarettes or if you have certain chronic medical conditions.  Tetanus, diphtheria, pertussis (Tdap, Td) immunization.** / A one-time dose of Tdap vaccine. After that, you need a Td booster dose every 10 years.  Measles, mumps, rubella (MMR) immunization. / You need at least 1 dose of MMR if you were born in 1957 or later. You may also need a second dose.  Varicella immunization.** / Consult your caregiver.  Meningococcal immunization.** / Consult your caregiver.  Hepatitis A immunization.** / Consult your caregiver. 2 doses, 6 to 18 months apart.  Hepatitis B immunization.** / Consult your caregiver. 3 doses, usually over 6 months. Ages 67 and over  Blood pressure check.** / Every 1 to 2 years.  Lipid and cholesterol check.** / Every 5 years beginning at age 74.  Clinical breast exam.** / Every year after age 34.  Mammogram.** / Every year beginning at age 43 and  continuing for as long as you are in good health. Consult with your caregiver.  Pap test.** / Every 3 years starting at age 66 through age 12 or 90 with a 3 consecutive normal Pap tests. Testing can be stopped between 65 and 70 with 3 consecutive normal Pap tests and no abnormal Pap or HPV tests in the past 10 years.  HPV screening.** / Every 3 years from ages 26 through ages 44 or 42 with a history of 3 consecutive normal Pap tests. Testing can be stopped between 65 and 70 with 3 consecutive normal Pap tests and no abnormal Pap or HPV tests in the past 10 years.  Fecal occult blood test (FOBT) of stool. / Every year beginning at age 51 and continuing until age 39. You may not need to do this test if you get a colonoscopy every 10 years.  Flexible sigmoidoscopy or colonoscopy.** / Every 5 years for a flexible sigmoidoscopy or every 10 years for a colonoscopy beginning at age 60 and continuing until age 58.  Hepatitis C blood test.** / For all people born from [birth year] through 1966 and any individual with known risks for hepatitis C.  Osteoporosis screening.** / A one-time screening for women ages 62 and over and women at risk for fractures or osteoporosis.  Skin self-exam. / Monthly.  Influenza immunization.** / Every year.  Pneumococcal polysaccharide immunization.** / 1 dose at age 50 (  or older) if you have never been vaccinated.  Tetanus, diphtheria, pertussis (Tdap, Td) immunization. / A one-time dose of Tdap vaccine if you are over 65 and have contact with an infant, are a Research scientist (physical sciences), or simply want to be protected from whooping cough. After that, you need a Td booster dose every 10 years.  Varicella immunization.** / Consult your caregiver.  Meningococcal immunization.** / Consult your caregiver.  Hepatitis A immunization.** / Consult your caregiver. 2 doses, 6 to 18 months apart.  Hepatitis B immunization.** / Check with your caregiver. 3 doses, usually over 6 months. ** Family  history and personal history of risk and conditions may change your caregiver's recommendations. Document Released: 03/29/2001 Document Revised: 04/25/2011 Document Reviewed: 06/28/2010 [facility] Patient Information 2013 [place], [place].

## 2012-08-08 ENCOUNTER — Other Ambulatory Visit: Payer: Self-pay

## 2012-08-08 DIAGNOSIS — Z1231 Encounter for screening mammogram for malignant neoplasm of breast: Secondary | ICD-10-CM

## 2012-09-17 ENCOUNTER — Ambulatory Visit: Payer: BC Managed Care – PPO

## 2012-09-18 ENCOUNTER — Ambulatory Visit: Payer: BC Managed Care – PPO

## 2012-09-24 ENCOUNTER — Ambulatory Visit
Admission: RE | Admit: 2012-09-24 | Discharge: 2012-09-24 | Disposition: A | Discharge status: Home / Self Care | Payer: BC Managed Care – PPO | Source: Ambulatory Visit

## 2012-09-24 DIAGNOSIS — Z1231 Encounter for screening mammogram for malignant neoplasm of breast: Secondary | ICD-10-CM

## 2012-09-27 ENCOUNTER — Ambulatory Visit: Payer: BC Managed Care – PPO

## 2013-02-14 HISTORY — PX: EXCISION RADIAL HEAD: SHX6611

## 2013-02-27 ENCOUNTER — Ambulatory Visit (INDEPENDENT_AMBULATORY_CARE_PROVIDER_SITE_OTHER): Payer: BC Managed Care – PPO | Admitting: Internal Medicine

## 2013-02-27 ENCOUNTER — Encounter: Payer: Self-pay | Admitting: Internal Medicine

## 2013-02-27 VITALS — BP 120/70 | HR 103 | Temp 100.8°F | Ht 61.5 in | Wt 198.0 lb

## 2013-02-27 DIAGNOSIS — R509 Fever, unspecified: Secondary | ICD-10-CM

## 2013-02-27 DIAGNOSIS — J0300 Acute streptococcal tonsillitis, unspecified: Secondary | ICD-10-CM | POA: Insufficient documentation

## 2013-02-27 DIAGNOSIS — J020 Streptococcal pharyngitis: Secondary | ICD-10-CM

## 2013-02-27 DIAGNOSIS — J039 Acute tonsillitis, unspecified: Secondary | ICD-10-CM

## 2013-02-27 LAB — POCT RAPID STREP A (OFFICE): Rapid Strep A Screen: POSITIVE — AB

## 2013-02-27 LAB — POCT INFLUENZA A/B
INFLUENZA A, POC: NEGATIVE
Influenza B, POC: NEGATIVE

## 2013-02-27 MED ORDER — AMOXICILLIN-POT CLAVULANATE 875-125 MG PO TABS: 1.0000 | ORAL_TABLET | Freq: Two times a day (BID) | ORAL | Status: DC

## 2013-02-27 NOTE — Patient Instructions (Addendum)
Strep test is positive  Begin antibiotic  Using braoder spectrum becauase of the location to right side  Expect  Fever and pain to be down in the next 2-3 days. Contact us if  persistent or progressive  Flu test is  Mostly helpful if positive   Has a high false negative  Rate.  Most flu illness infection develop cough .  Gargle ibuprofen tylenol for pain fever  . Avoid dehydration continue liquids .  ROV or call  If not improving in the next 48 hours or  If not better at end of medication    Strep Throat Strep throat is an infection of the throat caused by a bacteria named Streptococcus pyogenes. Your caregiver may call the infection streptococcal "tonsillitis" or "pharyngitis" depending on whether there are signs of inflammation in the tonsils or back of the throat. Strep throat is most common in children aged 5 15 years during the cold months of the year, but it can occur in people of any age during any season. This infection is spread from person to person (contagious) through coughing, sneezing, or other close contact. SYMPTOMS   Fever or chills.  Painful, swollen, red tonsils or throat.  Pain or difficulty when swallowing.  White or yellow spots on the tonsils or throat.  Swollen, tender lymph nodes or "glands" of the neck or under the jaw.  Red rash all over the body (rare). DIAGNOSIS  Many different infections can cause the same symptoms. A test must be done to confirm the diagnosis so the right treatment can be given. A "rapid strep test" can help your caregiver make the diagnosis in a few minutes. If this test is not available, a light swab of the infected area can be used for a throat culture test. If a throat culture test is done, results are usually available in a day or two. TREATMENT  Strep throat is treated with antibiotic medicine. HOME CARE INSTRUCTIONS   Gargle with 1 tsp of salt in 1 cup of warm water, 3 4 times per day or as needed for comfort.  Family members  who also have a sore throat or fever should be tested for strep throat and treated with antibiotics if they have the strep infection.  Make sure everyone in your household washes their hands well.  Do not share food, drinking cups, or personal items that could cause the infection to spread to others.  You may need to eat a soft food diet until your sore throat gets better.  Drink enough water and fluids to keep your urine clear or pale yellow. This will help prevent dehydration.  Get plenty of rest.  Stay home from school, daycare, or work until you have been on antibiotics for 24 hours.  Only take over-the-counter or prescription medicines for pain, discomfort, or fever as directed by your caregiver.  If antibiotics are prescribed, take them as directed. Finish them even if you start to feel better. SEEK MEDICAL CARE IF:   The glands in your neck continue to enlarge.  You develop a rash, cough, or earache.  You cough up green, yellow-brown, or bloody sputum.  You have pain or discomfort not controlled by medicines.  Your problems seem to be getting worse rather than better. SEEK IMMEDIATE MEDICAL CARE IF:   You develop any new symptoms such as vomiting, severe headache, stiff or painful neck, chest pain, shortness of breath, or trouble swallowing.  You develop severe throat pain, drooling, or changes in your  voice.  You develop swelling of the neck, or the skin on the neck becomes red and tender.  You have a fever.  You develop signs of dehydration, such as fatigue, dry mouth, and decreased urination.  You become increasingly sleepy, or you cannot wake up completely. Document Released: 01/29/2000 Document Revised: 01/18/2012 Document Reviewed: 04/01/2010 [facility] Patient Information 2014 [place], [place].

## 2013-02-27 NOTE — Progress Notes (Signed)
Pre visit review using our clinic review tool, if applicable. No additional management support is needed unless otherwise documented below in the visit note. Chief Complaint  Patient presents with  . Generalized Body Aches    Started yesterday.  Has taken 1 Advil today and regular medication.  . Sore Throat  . Fever  . Nausea    HPI: Patient comes in today for SDA for  new problem evaluation. Onset like body aches and then right soret throat and ear issues  Exercise previously .  Not too bad.  No cough ? Weather congestion.  Had flu vaccine hsuband jsut got back from [place] but isnt ill . No strep exposure  ROS: See pertinent positives and negatives per HPI. No rash cough cp sob    Past Medical History  Diagnosis Date  . Headache(784.0)   . Recurrent UTI   . Scleritis     hx  . Abnormal thyroid blood test   . Allergic rhinitis   . CTS (carpal tunnel syndrome)     Family History  Problem Relation Age of Onset  . Stroke Father   . Parkinsonism Father   . Other Son     traumatic brain injury 2011  . Thyroid disease    . Diabetes type II Father   . Hypertension    . Hyperlipidemia      History   Social History  . Marital Status: Married    Spouse Name: N/A    Number of Children: N/A  . Years of Education: N/A   Social History Main Topics  . Smoking status: Never Smoker   . Smokeless tobacco: None  . Alcohol Use: No  . Drug Use: No  . Sexual Activity:    Other Topics Concern  . 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    [place] country of origin   Bereaved parent   Son  speaking some still has g tube      Hh  of 4  . Daughter graduated from [employer] and is back home this month. To go to grad school in [place]    Outpatient Encounter Prescriptions as  of 02/27/2013  Medication Sig  . Incontinent Wash (BALNEOL) LOTN Apply 1 application topically as needed.  Marland Kitchen SYNTHROID 88 MCG tablet TAKE 1 TABLET BY MOUTH ONCE A DAY  . triamcinolone (KENALOG) 0.025 % cream APPLY TO AFFECTED AREA TWICE DAILY AS DIRECTED  . amoxicillin-clavulanate (AUGMENTIN) 875-125 MG per tablet Take 1 tablet by mouth every 12 (twelve) hours.  . Cholecalciferol (VITAMIN D-3 PO) Take 1 tablet by mouth daily.    EXAM:  BP 120/70  Pulse 103  Temp(Src) 100.8 F (38.2 C) (Oral)  Ht 5' 1.5" (1.562 m)  Wt 198 lb (89.812 kg)  BMI 36.81 kg/m2  SpO2 97%  Body mass index is 36.81 kg/(m^2).  GENERAL: vitals reviewed and listed above, alert, oriented, appears well hydrated and in no acute distress non toxic   HEENT: atraumatic, conjunctiva  clear, no obvious abnormalities on inspection of external nose and ears tms right nl left partly occulded view grey OP :  Right tonsil cryps and some edema 2+ exudate  Left midl erythem and  no exudate NECK: no obvious masses on inspection supple  Left 1_+ ac tender node  LUNGS: clear to auscultation bilaterally, no wheezes, rales or rhonchi, good air movement CV: HRRR, no clubbing cyanosis or  peripheral edema nl cap refill  Abdomen:  Sof,t normal bowel sounds without hepatosplenomegaly, no guarding rebound or masses no CVA tenderness Skin: normal capillary refill ,turgor , color: No acute rashes ,petechiae or bruising  cooperative,  Cognition intact  ASSESSMENT AND PLAN:  Discussed the following assessment and plan:  Acute streptococcal tonsillitis - right sided   Acute tonsillitis - unilateral with adenopathy  fever.  rx for bacterial cause close fu warranted  if not better  - Plan: POCT rapid strep A, POCT Influenza A/B, CANCELED: Culture, Group A Strep  Fever, unspecified - from throat infection however influ epidemic in community has disable son - Plan: POCT rapid strep A, POCT Influenza A/B, CANCELED: Culture, Group A  Strep usding broader spectrum because of concern about early cellulitis based on exam -Patient advised to return or notify health care team  if symptoms worsen or persist or new concerns arise.  Patient Instructions  Strep test is positive  Begin antibiotic  Using braoder spectrum becauase of the location to right side  Expect  Fever and pain to be down in the next 2-3 days. Contact us if  persistent or progressive  Flu test is  Mostly helpful if positive   Has a high false negative  Rate.  Most flu illness infection develop cough .  Gargle ibuprofen tylenol for pain fever  . Avoid dehydration continue liquids .  ROV or call  If not improving in the next 48 hours or  If not better at end of medication    Strep Throat Strep throat is an infection of the throat caused by a bacteria named Streptococcus pyogenes. Your caregiver may call the infection streptococcal "tonsillitis" or "pharyngitis" depending on whether there are signs of inflammation in the tonsils or back of the throat. Strep throat is most common in children aged 5 15 years during the cold months of the year, but it can occur in people of any age during any season. This infection is spread from person to person (contagious) through coughing, sneezing, or other close contact. SYMPTOMS   Fever or chills.  Painful, swollen, red tonsils or throat.  Pain or difficulty when swallowing.  White or yellow spots on the tonsils or throat.  Swollen, tender lymph nodes or "glands" of the neck or under the jaw.  Red rash all over the body (rare). DIAGNOSIS  Many different infections can cause the same symptoms. A test must be done to confirm the diagnosis so the right treatment can be given. A "rapid strep test" can help your caregiver make the diagnosis in a few minutes. If this test is not available, a light swab of the infected area can be used for a throat culture test. If a throat culture test is done, results are usually available  in a day or two. TREATMENT  Strep throat is treated with antibiotic medicine. HOME CARE INSTRUCTIONS   Gargle with 1 tsp of salt in 1 cup of warm water, 3 4 times per day or as needed for comfort.  Family members who also have a sore throat or fever should be tested for strep throat and treated with antibiotics if they have the strep infection.  Make sure everyone in your household washes their hands well.  Do not share food, drinking cups,  or personal items that could cause the infection to spread to others.  You may need to eat a soft food diet until your sore throat gets better.  Drink enough water and fluids to keep your urine clear or pale yellow. This will help prevent dehydration.  Get plenty of rest.  Stay home from school, daycare, or work until you have been on antibiotics for 24 hours.  Only take over-the-counter or prescription medicines for pain, discomfort, or fever as directed by your caregiver.  If antibiotics are prescribed, take them as directed. Finish them even if you start to feel better. SEEK MEDICAL CARE IF:   The glands in your neck continue to enlarge.  You develop a rash, cough, or earache.  You cough up green, yellow-brown, or bloody sputum.  You have pain or discomfort not controlled by medicines.  Your problems seem to be getting worse rather than better. SEEK IMMEDIATE MEDICAL CARE IF:   You develop any new symptoms such as vomiting, severe headache, stiff or painful neck, chest pain, shortness of breath, or trouble swallowing.  You develop severe throat pain, drooling, or changes in your voice.  You develop swelling of the neck, or the skin on the neck becomes red and tender.  You have a fever.  You develop signs of dehydration, such as fatigue, dry mouth, and decreased urination.  You become increasingly sleepy, or you cannot wake up completely. Document Released: 01/29/2000 Document Revised: 01/18/2012 Document Reviewed:  04/01/2010 [facility] Patient Information 2014 [place], [place].      Standley Brooking. Panosh M.D.

## 2013-07-18 ENCOUNTER — Ambulatory Visit (INDEPENDENT_AMBULATORY_CARE_PROVIDER_SITE_OTHER): Payer: BC Managed Care – PPO | Admitting: Internal Medicine

## 2013-07-18 ENCOUNTER — Encounter: Payer: Self-pay | Admitting: Internal Medicine

## 2013-07-18 VITALS — BP 152/70 | HR 92 | Temp 99.0°F | Ht 61.0 in | Wt 203.0 lb

## 2013-07-18 DIAGNOSIS — J988 Other specified respiratory disorders: Secondary | ICD-10-CM

## 2013-07-18 MED ORDER — PREDNISONE 20 MG PO TABS: ORAL_TABLET | ORAL | Status: DC

## 2013-07-18 MED ORDER — ALBUTEROL SULFATE HFA 108 (90 BASE) MCG/ACT IN AERS: 2.0000 | INHALATION_SPRAY | Freq: Four times a day (QID) | RESPIRATORY_TRACT | Status: DC | PRN

## 2013-07-18 MED ORDER — ALBUTEROL SULFATE (2.5 MG/3ML) 0.083% IN NEBU
2.5000 mg | INHALATION_SOLUTION | Freq: Once | RESPIRATORY_TRACT | Status: AC
  Administered 2013-07-18: 2.5 mg via RESPIRATORY_TRACT

## 2013-07-18 NOTE — Progress Notes (Signed)
Chief Complaint  Patient presents with  . Sore Throat    Has recently traveled to [place].  Cough and wheezing worsen at night.  . Cough  . Nasal Congestion  . Wheezing    HPI: Patient comes in today for SDA for  new problem evaluation. Last seen for strep in January  Onset like runny nose .   Just came back from [place] was there for a few weeks with family. In laws  Had had uri and chest sx prolonged.  was ether a few weeks.  Exhale feel tight   Wheezy some cough  Tired Last night no real sob  No ets no hx asthma  No meds for this  ROS: See pertinent positives and negatives per HPI no fever hemoptysis sob otherwise asks abut comm to som who is disabled from head injury   Past Medical History  Diagnosis Date  . Headache(784.0)   . Recurrent UTI   . Scleritis     hx  . Abnormal thyroid blood test   . Allergic rhinitis   . CTS (carpal tunnel syndrome)     Family History  Problem Relation Age of Onset  . Stroke Father   . Parkinsonism Father   . Other Son     traumatic brain injury 2011  . Thyroid disease    . Diabetes type II Father   . Hypertension    . Hyperlipidemia      History   Social History  . Marital Status: Married    Spouse Name: N/A    Number of Children: N/A  . Years of Education: N/A   Social History Main Topics  . Smoking status: Never Smoker   . Smokeless tobacco: Not on file  . Alcohol Use: No  . Drug Use: No  . Sexual Activity:    Other Topics Concern  . 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    [place] country of origin   Bereaved parent   Son  speaking some still has g tube      Hh  of 4  . Daughter graduated from [employer] and is back home this month. To go to grad school in [place]    Outpatient Encounter  Prescriptions as of 07/18/2013  Medication Sig  . Cholecalciferol (VITAMIN D-3 PO) Take 1 tablet by mouth daily.  . Incontinent Wash (BALNEOL) LOTN Apply 1 application topically as needed.  Marland Kitchen SYNTHROID 88 MCG tablet TAKE 1 TABLET BY MOUTH ONCE A DAY  . triamcinolone (KENALOG) 0.025 % cream APPLY TO AFFECTED AREA TWICE DAILY AS DIRECTED  . albuterol (PROAIR HFA) 108 (90 BASE) MCG/ACT inhaler Inhale 2 puffs into the lungs every 6 (six) hours as needed for wheezing.  . predniSONE (DELTASONE) 20 MG tablet Take 3 po qd for 2 days then 2 po qd for 3 days,or as directed if needed for wheezing  . [DISCONTINUED] amoxicillin-clavulanate (AUGMENTIN) 875-125 MG per tablet Take 1 tablet by mouth every 12 (twelve) hours.  Marland Kitchen albuterol (PROVENTIL) (2.5 MG/3ML) 0.083% nebulizer solution 2.5 mg     EXAM:  BP 152/70  Pulse 92  Temp(Src) 99 F (37.2 C) (Oral)  Ht 5\' 1"  (  1.549 m)  Wt 203 lb (92.08 kg)  BMI 38.38 kg/m2  SpO2 98%  Body mass index is 38.38 kg/(m^2).  GENERAL: vitals reviewed and listed above, alert, oriented, appears well hydrated and in no acute distress minimal ur congestion  HEENT: atraumatic, conjunctiva  clear, no obvious abnormalities on inspection of external nose and earssome wax left eac  No facial tenderness  OP : no lesion edema or exudate  Small tonsils nl airway NECK: no obvious masses on inspection palpation  LUNGS: diffuse wheezing  Musical all fields  A bit tight but no resp distress  After nebulizer dec wheezing aand inc air movement but pt says doesn't feel the difference  CV: HRRR, no clubbing cyanosis or  peripheral edema nl cap refill  MS: moves all extremities without noticeable focal  abnormality PSYCH: pleasant and cooperative, no obvious depression or anxiety  ASSESSMENT AND PLAN:  Discussed the following assessment and plan:  Wheezing-associated respiratory infection (WARI) - Plan: albuterol (PROVENTIL) (2.5 MG/3ML) 0.083% nebulizer solution 2.5 mg Empiric  trial of albuterol   Today   prob viral no signs bacterial infection  Close observation andfu if needed rx if needed for inhaler and or pred risk benefit  Ok to wait and see also  Due for yearly and labs   Can make apt for the summer  -Patient advised to return or notify health care team  if symptoms worsen ,persist or new concerns arise.  Patient Instructions  This is probably a viral respiratory infection that triggered wheezing and should resolve  .  On its own but sometimes need to add medication for wheezing .  Such as  No signs of pneumonia  Or bacterial infection today but if you get  Fever or worsening then contact us .     Jackie Lewis. Jackie Lewis M.D.  Pre visit review using our clinic review tool, if applicable. No additional management support is needed unless otherwise documented below in the visit note.

## 2013-07-18 NOTE — Patient Instructions (Addendum)
This is probably a viral respiratory infection that triggered wheezing and should resolve  .  On its own but sometimes need to add medication for wheezing .  Such as  No signs of pneumonia  Or bacterial infection today but if you get  Fever or worsening then contact us .

## 2013-08-29 ENCOUNTER — Other Ambulatory Visit (INDEPENDENT_AMBULATORY_CARE_PROVIDER_SITE_OTHER): Payer: BC Managed Care – PPO

## 2013-08-29 DIAGNOSIS — Z0000 Encounter for general adult medical examination without abnormal findings: Secondary | ICD-10-CM

## 2013-08-29 LAB — CBC WITH DIFFERENTIAL/PLATELET
Basophils Absolute: 0 10*3/uL (ref 0.0–0.1)
Basophils Relative: 0.4 % (ref 0.0–3.0)
EOS PCT: 2.8 % (ref 0.0–5.0)
Eosinophils Absolute: 0.2 10*3/uL (ref 0.0–0.7)
HEMATOCRIT: 37.2 % (ref 36.0–46.0)
HEMOGLOBIN: 12.2 g/dL (ref 12.0–15.0)
LYMPHS ABS: 2.1 10*3/uL (ref 0.7–4.0)
Lymphocytes Relative: 33.1 % (ref 12.0–46.0)
MCHC: 32.8 g/dL (ref 30.0–36.0)
MCV: 85.4 fl (ref 78.0–100.0)
MONOS PCT: 6.3 % (ref 3.0–12.0)
Monocytes Absolute: 0.4 10*3/uL (ref 0.1–1.0)
NEUTROS ABS: 3.6 10*3/uL (ref 1.4–7.7)
Neutrophils Relative %: 57.4 % (ref 43.0–77.0)
Platelets: 219 10*3/uL (ref 150.0–400.0)
RBC: 4.36 Mil/uL (ref 3.87–5.11)
RDW: 14.4 % (ref 11.5–15.5)
WBC: 6.3 10*3/uL (ref 4.0–10.5)

## 2013-08-29 LAB — BASIC METABOLIC PANEL
BUN: 19 mg/dL (ref 6–23)
CO2: 28 mEq/L (ref 19–32)
Calcium: 9.6 mg/dL (ref 8.4–10.5)
Chloride: 102 mEq/L (ref 96–112)
Creatinine, Ser: 0.8 mg/dL (ref 0.4–1.2)
GFR: 76.82 mL/min (ref >60.00)
Glucose, Bld: 102 mg/dL — ABNORMAL HIGH (ref 70–99)
POTASSIUM: 4.4 meq/L (ref 3.5–5.1)
Sodium: 137 mEq/L (ref 135–145)

## 2013-08-29 LAB — HEPATIC FUNCTION PANEL
ALBUMIN: 4 g/dL (ref 3.5–5.2)
ALT: 23 U/L (ref 0–35)
AST: 21 U/L (ref 0–37)
Alkaline Phosphatase: 67 U/L (ref 39–117)
Bilirubin, Direct: 0 mg/dL (ref 0.0–0.3)
Total Bilirubin: 0.4 mg/dL (ref 0.2–1.2)
Total Protein: 7.6 g/dL (ref 6.0–8.3)

## 2013-08-29 LAB — LIPID PANEL
Cholesterol: 191 mg/dL (ref 0–200)
HDL: 58.9 mg/dL (ref >39.00)
LDL Cholesterol: 108 mg/dL — ABNORMAL HIGH (ref 0–99)
NONHDL: 132.1
Total CHOL/HDL Ratio: 3
Triglycerides: 120 mg/dL (ref 0.0–149.0)
VLDL: 24 mg/dL (ref 0.0–40.0)

## 2013-08-29 LAB — TSH: TSH: 3.36 u[IU]/mL (ref 0.35–4.50)

## 2013-09-02 ENCOUNTER — Ambulatory Visit (INDEPENDENT_AMBULATORY_CARE_PROVIDER_SITE_OTHER): Payer: BC Managed Care – PPO | Admitting: Internal Medicine

## 2013-09-02 ENCOUNTER — Encounter: Payer: Self-pay | Admitting: Internal Medicine

## 2013-09-02 VITALS — BP 122/70 | HR 81 | Temp 98.4°F | Ht 61.25 in | Wt 201.0 lb

## 2013-09-02 DIAGNOSIS — E039 Hypothyroidism, unspecified: Secondary | ICD-10-CM

## 2013-09-02 DIAGNOSIS — Z8639 Personal history of other endocrine, nutritional and metabolic disease: Secondary | ICD-10-CM | POA: Insufficient documentation

## 2013-09-02 DIAGNOSIS — Z0000 Encounter for general adult medical examination without abnormal findings: Secondary | ICD-10-CM

## 2013-09-02 DIAGNOSIS — E669 Obesity, unspecified: Secondary | ICD-10-CM | POA: Insufficient documentation

## 2013-09-02 DIAGNOSIS — Z862 Personal history of diseases of the blood and blood-forming organs and certain disorders involving the immune mechanism: Secondary | ICD-10-CM

## 2013-09-02 MED ORDER — LEVOTHYROXINE SODIUM 88 MCG PO TABS: 88.0000 ug | ORAL_TABLET | Freq: Every day | ORAL | Status: DC

## 2013-09-02 NOTE — Progress Notes (Signed)
Pre visit review using our clinic review tool, if applicable. No additional management support is needed unless otherwise documented below in the visit note. 

## 2013-09-02 NOTE — Progress Notes (Signed)
Chief Complaint  Patient presents with  . Annual Exam    no pap    HPI: Patient comes in today for Preventive Health Care visit  Continues to caretaker her son recovered from sever brain injury gets some exercise at gym but not a lot forgets to hydrate with water  Sometimes tired after eating  ? Could be BG.  No syncope fever recent uti.  Concern aobut cost of synthroid $100 for 90 days   Health Maintenance  Topic Date Due  . Influenza Vaccine  09/14/2013  . Pap Smear  07/01/2014  . Mammogram  09/25/2014  . Colonoscopy  05/03/2015  . Tetanus/tdap  05/30/2018   Health Maintenance Review LIFESTYLE:  Exercise:  Not a lot  Tobacco/ETS:no Alcohol:  no Sugar beverages:not much Sleep: ok not a lot  Drug use: no Colonoscopy:  utd 2007   ROS:  GEN/ HEENT: No fever, significant weight changes sweats headaches vision problems hearing changes, CV/ PULM; No chest pain shortness of breath cough, syncope,edema  change in exercise tolerance. GI /GU: No adominal pain, vomiting, change in bowel habits. No blood in the stool. No significant GU symptoms. SKIN/HEME: ,no acute skin rashes suspicious lesions or bleeding. No lymphadenopathy, nodules, masses.  NEURO/ PSYCH:  No neurologic signs such as weakness numbness. No depression anxiety. IMM/ Allergy: No unusual infections.  Allergy .   REST of 12 system review negative except as per HPI   Past Medical History  Diagnosis Date  . Headache(784.0)   . Recurrent UTI   . Scleritis     hx  . Abnormal thyroid blood test   . Allergic rhinitis   . CTS (carpal tunnel syndrome)     Family History  Problem Relation Age of Onset  . Stroke Father   . Parkinsonism Father   . Other Son     traumatic brain injury 2011  . Thyroid disease    . Diabetes type II Father   . Hypertension    . Hyperlipidemia      History   Social History  . Marital Status: Married    Spouse Name: N/A    Number of Children: N/A  . Years of Education: N/A     Social History Main Topics  . Smoking status: Never Smoker   . Smokeless tobacco: None  . Alcohol Use: No  . Drug Use: No  . Sexual Activity:    Other Topics Concern  . 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    [place] country of origin   Bereaved parent   Son  speaking some still has g tube      Hh  of 4  . Daughter graduated from [employer] and is back home this month. To go to grad school in [place]    Outpatient Encounter Prescriptions as of 09/02/2013  Medication Sig  . Cholecalciferol (VITAMIN D-3 PO) Take 1 tablet by mouth daily.  . Incontinent Wash (BALNEOL) LOTN Apply 1 application topically as needed.  Marland Kitchen levothyroxine (SYNTHROID, LEVOTHROID) 88 MCG tablet Take 1 tablet (88 mcg total) by mouth daily before breakfast.  . triamcinolone (KENALOG) 0.025 % cream APPLY TO AFFECTED AREA TWICE DAILY AS DIRECTED  . [  DISCONTINUED] SYNTHROID 88 MCG tablet TAKE 1 TABLET BY MOUTH ONCE A DAY  . [DISCONTINUED] albuterol (PROAIR HFA) 108 (90 BASE) MCG/ACT inhaler Inhale 2 puffs into the lungs every 6 (six) hours as needed for wheezing.  . [DISCONTINUED] predniSONE (DELTASONE) 20 MG tablet Take 3 po qd for 2 days then 2 po qd for 3 days,or as directed if needed for wheezing    EXAM:  BP 122/70  Pulse 81  Temp(Src) 98.4 F (36.9 C) (Oral)  Ht 5' 1.25" (1.556 m)  Wt 201 lb (91.173 kg)  BMI 37.66 kg/m2  Body mass index is 37.66 kg/(m^2).  Physical Exam: Vital signs reviewed ALP:FXTK is a well-developed well-nourished alert cooperative    who appearsr stated age in no acute distress.  HEENT: normocephalic atraumatic , Eyes: PERRL EOM's full, conjunctiva clear, Nares: paten,t no deformity discharge or tenderness., Ears: no deformity EAC's clear TMs with normal landmarks. Mouth:  clear OP, no lesions, edema.  Moist mucous membranes. Dentition in adequate repair. NECK: supple without masses, thyromegaly or bruits. CHEST/PULM:  Clear to auscultation and percussion breath sounds equal no wheeze , rales or rhonchi. No chest wall deformities or tenderness. CV: PMI is nondisplaced, S1 S2 no gallops, murmurs, rubs. Peripheral pulses are full without delay.No JVD .  Breast: normal by inspection . No dimpling, discharge, masses, tenderness or discharge . ABDOMEN: Bowel sounds normal nontender  No guard or rebound, no hepato splenomegal no CVA tenderness.  No hernia. Extremtities:  No clubbing cyanosis or edema, no acute joint swelling or redness no focal atrophy NEURO:  Oriented x3, cranial nerves 3-12 appear to be intact, no obvious focal weakness,gait within normal limits no abnormal reflexes or asymmetrical SKIN: No acute rashes normal turgor, color, no bruising or petechiae. PSYCH: Oriented, good eye contact, no obvious depression anxiety, cognition and judgment appear normal. LN: no cervical axillary inguinal adenopathy  Lab Results  Component Value Date   WBC 6.3 08/29/2013   HGB 12.2 08/29/2013   HCT 37.2 08/29/2013   PLT 219.0 08/29/2013   GLUCOSE 102* 08/29/2013   CHOL 191 08/29/2013   TRIG 120.0 08/29/2013   HDL 58.90 08/29/2013   LDLDIRECT 113.9 06/22/2010   LDLCALC 108* 08/29/2013   ALT 23 08/29/2013   AST 21 08/29/2013   NA 137 08/29/2013   K 4.4 08/29/2013   CL 102 08/29/2013   CREATININE 0.8 08/29/2013   BUN 19 08/29/2013   CO2 28 08/29/2013   TSH 3.36 08/29/2013   HGBA1C 5.4 07/17/2008   MICROALBUR 0.7 04/14/2006   Wt Readings from Last 3 Encounters:  09/02/13 201 lb (91.173 kg)  07/18/13 203 lb (92.08 kg)  02/27/13 198 lb (89.812 kg)    ASSESSMENT AND PLAN:  Discussed the following assessment and plan:  Routine general medical examination at a health care facility - utd   Unspecified hypothyroidism - ok to try generic for cost PBcosts and check tsh in 2-3  months if ok then yearly  History of hyperglycemia - fbs up again  counseled  Patient Care Team: Burnis Medin, MD as PCP - General ent Patient Instructions  Lose 5-10 pounds in healthy manner will help you feel better and  Will help delay getting diabetes.  Avoid simple carbohydrates sych as bread and whit rice.   Healthy lifestyle includes : At least 150 minutes of exercise weeks  , weight at healthy levels, which is usually   BMI 19-25. Avoid trans fats and processed foods;  Increase fresh fruits and veges  to 5 servings per day. And avoid sweet beverages including tea and juice. Mediterranean diet with olive oil and nuts have been noted to be heart and brain healthy . Avoid tobacco products . Limit  alcohol to  7 per week for women and 14 servings for men.  Get adequate sleep .  Ok to try generic thyroid medication and recheck tsh in 2-3 months to see if level is stable.      Standley Brooking. Lyncoln Maskell M.D.

## 2013-09-02 NOTE — Patient Instructions (Addendum)
Lose 5-10 pounds in healthy manner will help you feel better and  Will help delay getting diabetes.  Avoid simple carbohydrates sych as bread and whit rice.   Healthy lifestyle includes : At least 150 minutes of exercise weeks  , weight at healthy levels, which is usually   BMI 19-25. Avoid trans fats and processed foods;  Increase fresh fruits and veges to 5 servings per day. And avoid sweet beverages including tea and juice. Mediterranean diet with olive oil and nuts have been noted to be heart and brain healthy . Avoid tobacco products . Limit  alcohol to  7 per week for women and 14 servings for men.  Get adequate sleep .  Ok to try generic thyroid medication and recheck tsh in 2-3 months to see if level is stable.

## 2013-09-11 ENCOUNTER — Other Ambulatory Visit: Payer: Self-pay

## 2013-09-11 DIAGNOSIS — Z1231 Encounter for screening mammogram for malignant neoplasm of breast: Secondary | ICD-10-CM

## 2013-09-27 ENCOUNTER — Ambulatory Visit
Admission: RE | Admit: 2013-09-27 | Discharge: 2013-09-27 | Disposition: A | Discharge status: Home / Self Care | Payer: BC Managed Care – PPO | Source: Ambulatory Visit

## 2013-09-27 DIAGNOSIS — Z1231 Encounter for screening mammogram for malignant neoplasm of breast: Secondary | ICD-10-CM

## 2013-10-28 ENCOUNTER — Encounter: Payer: Self-pay | Admitting: Internal Medicine

## 2013-10-28 ENCOUNTER — Ambulatory Visit (INDEPENDENT_AMBULATORY_CARE_PROVIDER_SITE_OTHER): Payer: BC Managed Care – PPO | Admitting: Internal Medicine

## 2013-10-28 VITALS — BP 116/64 | Temp 98.2°F | Wt 198.0 lb

## 2013-10-28 DIAGNOSIS — R300 Dysuria: Secondary | ICD-10-CM

## 2013-10-28 DIAGNOSIS — N390 Urinary tract infection, site not specified: Secondary | ICD-10-CM

## 2013-10-28 DIAGNOSIS — R350 Frequency of micturition: Secondary | ICD-10-CM

## 2013-10-28 DIAGNOSIS — R109 Unspecified abdominal pain: Secondary | ICD-10-CM

## 2013-10-28 DIAGNOSIS — Z2821 Immunization not carried out because of patient refusal: Secondary | ICD-10-CM

## 2013-10-28 LAB — POCT URINALYSIS DIPSTICK
BILIRUBIN UA: NEGATIVE
Blood, UA: NEGATIVE
GLUCOSE UA: NEGATIVE
KETONES UA: NEGATIVE
Nitrite, UA: NEGATIVE
Protein, UA: NEGATIVE
Spec Grav, UA: 1.01
Urobilinogen, UA: 0.2
pH, UA: 7.5

## 2013-10-28 MED ORDER — CIPROFLOXACIN HCL 500 MG PO TABS: 500.0000 mg | ORAL_TABLET | Freq: Two times a day (BID) | ORAL | Status: DC

## 2013-10-28 NOTE — Progress Notes (Signed)
Pre visit review using our clinic review tool, if applicable. No additional management support is needed unless otherwise documented below in the visit note.   Chief Complaint  Patient presents with  . Dysuria    Started last night.  Denies fever and/or chills.  . Abdominal Pain  . Urinary Frequency    HPI: Patient Jackie Lewis  comes in today for SDA for  new problem evaluation. Onset last pm hx of utis  No fever  . Chills but dysuria frequency urgency no blood  Last uti  Months ago ons ROS: See pertinent positives and negatives per HPI.  Past Medical History  Diagnosis Date  . Headache(784.0)   . Recurrent UTI   . Scleritis     hx  . Abnormal thyroid blood test   . Allergic rhinitis   . CTS (carpal tunnel syndrome)     Family History  Problem Relation Age of Onset  . Stroke Father   . Parkinsonism Father   . Other Son     traumatic brain injury 2011  . Thyroid disease    . Diabetes type II Father   . Hypertension    . Hyperlipidemia      History   Social History  . Marital Status: Married    Spouse Name: N/A    Number of Children: N/A  . Years of Education: N/A   Social History Main Topics  . Smoking status: Never Smoker   . Smokeless tobacco: None  . Alcohol Use: No  . Drug Use: No  . Sexual Activity:    Other Topics Concern  . 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    [place] country of origin   Bereaved parent   Son  speaking some  Walks feeds sabnd dressed self  Can go to gym for som exercise      Hh  of 4  . Daughter graduated from [employer] and is back home this month. To go to grad school in [place]    Outpatient Encounter Prescriptions as of 10/28/2013  Medication Sig  . Cholecalciferol (VITAMIN D-3 PO) Take 1 tablet by mouth daily.  .  Incontinent Wash (BALNEOL) LOTN Apply 1 application topically as needed.  Marland Kitchen levothyroxine (SYNTHROID, LEVOTHROID) 88 MCG tablet Take 1 tablet (88 mcg total) by mouth daily before breakfast.  . triamcinolone (KENALOG) 0.025 % cream APPLY TO AFFECTED AREA TWICE DAILY AS DIRECTED  . ciprofloxacin (CIPRO) 500 MG tablet Take 1 tablet (500 mg total) by mouth 2 (two) times daily.    EXAM:  BP 116/64  Temp(Src) 98.2 F (36.8 C) (Oral)  Wt 198 lb (89.812 kg)  Body mass index is 37.09 kg/(m^2).  GENERAL: vitals reviewed and listed above, alert, oriented, appears well hydrated and in no acute distress Abdomen:  Sof,t normal bowel sounds without hepatosplenomegaly, no guarding rebound or masses no CVA tenderness CV: HRRR, no clubbing cyanosis or  peripheral edema nl cap refill  MS: moves all extremities without noticeable focal  abnormality PSYCH: pleasant and cooperative, no obvious depression or anxiety ua 3+leuk neg blood  ASSESSMENT AND PLAN:  Discussed the following assessment and plan:  Urinary tract infection, site not specified  Dysuria - Plan: POC Urinalysis Dipstick, Urine culture  Abdominal pain, unspecified abdominal location - Plan: POC Urinalysis Dipstick, Urine culture  Urinary frequency - Plan: POC Urinalysis Dipstick, Urine culture Hx of utis  Begin antibiotic expectant management  Fu if needed not better  -Patient advised to return or notify health care team  if symptoms worsen ,persist or new concerns arise.  Patient Instructions  Treat for uti   Will let you know when culture results are back. Should be feeling a lot better in the next 24 hours .      Standley Brooking. Panosh M.D.   Pt  Declined flu vaccine today

## 2013-10-28 NOTE — Patient Instructions (Signed)
Treat for uti   Will let you know when culture results are back. Should be feeling a lot better in the next 24 hours .

## 2013-10-31 LAB — URINE CULTURE

## 2013-10-31 NOTE — Progress Notes (Signed)
Quick Note:  Tell patient that urine culture shows e coli sensitive to medication given . Should resolve with current treatment .FU if not better. ______ 

## 2013-11-01 ENCOUNTER — Encounter: Payer: Self-pay | Admitting: Family Medicine

## 2013-12-02 ENCOUNTER — Other Ambulatory Visit: Payer: Self-pay | Admitting: Family Medicine

## 2013-12-02 DIAGNOSIS — E039 Hypothyroidism, unspecified: Secondary | ICD-10-CM

## 2013-12-03 ENCOUNTER — Other Ambulatory Visit (INDEPENDENT_AMBULATORY_CARE_PROVIDER_SITE_OTHER): Payer: BC Managed Care – PPO

## 2013-12-03 DIAGNOSIS — E039 Hypothyroidism, unspecified: Secondary | ICD-10-CM

## 2013-12-03 LAB — TSH: TSH: 1.66 u[IU]/mL (ref 0.35–4.50)

## 2013-12-05 ENCOUNTER — Encounter: Payer: Self-pay | Admitting: Family Medicine

## 2013-12-07 ENCOUNTER — Encounter (HOSPITAL_COMMUNITY): Payer: Self-pay | Admitting: Emergency Medicine

## 2013-12-07 ENCOUNTER — Emergency Department (HOSPITAL_COMMUNITY)
Admission: EM | Admit: 2013-12-07 | Discharge: 2013-12-07 | Disposition: A | Discharge status: Home / Self Care | Payer: BC Managed Care – PPO | Attending: Emergency Medicine | Admitting: Emergency Medicine

## 2013-12-07 ENCOUNTER — Emergency Department (HOSPITAL_COMMUNITY): Payer: BC Managed Care – PPO

## 2013-12-07 DIAGNOSIS — Z87440 Personal history of urinary (tract) infections: Secondary | ICD-10-CM | POA: Insufficient documentation

## 2013-12-07 DIAGNOSIS — Z8669 Personal history of other diseases of the nervous system and sense organs: Secondary | ICD-10-CM | POA: Insufficient documentation

## 2013-12-07 DIAGNOSIS — Y92009 Unspecified place in unspecified non-institutional (private) residence as the place of occurrence of the external cause: Secondary | ICD-10-CM | POA: Diagnosis not present

## 2013-12-07 DIAGNOSIS — W010XXA Fall on same level from slipping, tripping and stumbling without subsequent striking against object, initial encounter: Secondary | ICD-10-CM | POA: Diagnosis not present

## 2013-12-07 DIAGNOSIS — Y9389 Activity, other specified: Secondary | ICD-10-CM | POA: Diagnosis not present

## 2013-12-07 DIAGNOSIS — S6991XA Unspecified injury of right wrist, hand and finger(s), initial encounter: Secondary | ICD-10-CM | POA: Insufficient documentation

## 2013-12-07 DIAGNOSIS — Z79899 Other long term (current) drug therapy: Secondary | ICD-10-CM | POA: Diagnosis not present

## 2013-12-07 DIAGNOSIS — S52121A Displaced fracture of head of right radius, initial encounter for closed fracture: Secondary | ICD-10-CM | POA: Diagnosis not present

## 2013-12-07 DIAGNOSIS — S59901A Unspecified injury of right elbow, initial encounter: Secondary | ICD-10-CM | POA: Diagnosis present

## 2013-12-07 DIAGNOSIS — W19XXXA Unspecified fall, initial encounter: Secondary | ICD-10-CM

## 2013-12-07 MED ORDER — TRAMADOL HCL 50 MG PO TABS
ORAL_TABLET | ORAL | Status: DC
Start: 2013-12-07 — End: 2014-01-03

## 2013-12-07 MED ORDER — IBUPROFEN 400 MG PO TABS
600.0000 mg | ORAL_TABLET | Freq: Once | ORAL | Status: AC
  Administered 2013-12-07: 600 mg via ORAL
  Filled 2013-12-07 (×2): qty 1

## 2013-12-07 NOTE — ED Notes (Signed)
Pt to xray at this time.

## 2013-12-07 NOTE — Discharge Instructions (Signed)
Elevate your arm. Use ice packs. Wear the splint and use the sling until you are rechecked by Dr Burney Gauze, an orthopedic hand specialist. He wants you to call the office on Monday to see him Monday or Tuesday. He is planning on doing surgery on you Wednesday or Thursday.    Cast or Splint Care Casts and splints support injured limbs and keep bones from moving while they heal. It is important to care for your cast or splint at home.  HOME CARE INSTRUCTIONS  Keep the cast or splint uncovered during the drying period. It can take 24 to 48 hours to dry if it is made of plaster. A fiberglass cast will dry in less than 1 hour.  Do not rest the cast on anything harder than a pillow for the first 24 hours.  Do not put weight on your injured limb or apply pressure to the cast until your health care provider gives you permission.  Keep the cast or splint dry. Wet casts or splints can lose their shape and may not support the limb as well. A wet cast that has lost its shape can also create harmful pressure on your skin when it dries. Also, wet skin can become infected.  Cover the cast or splint with a plastic bag when bathing or when out in the rain or snow. If the cast is on the trunk of the body, take sponge baths until the cast is removed.  If your cast does become wet, dry it with a towel or a blow dryer on the cool setting only.  Keep your cast or splint clean. Soiled casts may be wiped with a moistened cloth.  Do not place any hard or soft foreign objects under your cast or splint, such as cotton, toilet paper, lotion, or powder.  Do not try to scratch the skin under the cast with any object. The object could get stuck inside the cast. Also, scratching could lead to an infection. If itching is a problem, use a blow dryer on a cool setting to relieve discomfort.  Do not trim or cut your cast or remove padding from inside of it.  Exercise all joints next to the injury that are not immobilized by  the cast or splint. For example, if you have a long leg cast, exercise the hip joint and toes. If you have an arm cast or splint, exercise the shoulder, elbow, thumb, and fingers.  Elevate your injured arm or leg on 1 or 2 pillows for the first 1 to 3 days to decrease swelling and pain.It is best if you can comfortably elevate your cast so it is higher than your heart. SEEK MEDICAL CARE IF:   Your cast or splint cracks.  Your cast or splint is too tight or too loose.  You have unbearable itching inside the cast.  Your cast becomes wet or develops a soft spot or area.  You have a bad smell coming from inside your cast.  You get an object stuck under your cast.  Your skin around the cast becomes red or raw.  You have new pain or worsening pain after the cast has been applied. SEEK IMMEDIATE MEDICAL CARE IF:   You have fluid leaking through the cast.  You are unable to move your fingers or toes.  You have discolored (blue or white), cool, painful, or very swollen fingers or toes beyond the cast.  You have tingling or numbness around the injured area.  You have severe pain  or pressure under the cast.  You have any difficulty with your breathing or have shortness of breath.  You have chest pain. Document Released: 01/29/2000 Document Revised: 11/21/2012 Document Reviewed: 08/09/2012 [facility] Patient Information 2015 [place], [place]. This information is not intended to replace advice given to you by your health care provider. Make sure you discuss any questions you have with your health care provider.  Radial Head Fracture A radial head fracture is a break of the smaller bone (radius) in the forearm. The head of this bone is the part near the elbow. These fractures commonly happen during a fall, when you land on an outstretched arm. These fractures are more common in middle aged adults and are common with a dislocation of the elbow. SYMPTOMS   Swelling of the elbow joint and pain  on the outside of the elbow.  Pain and difficulty in bending or straightening the elbow.  Pain and difficulty in turning the palm of the hand up or down with the elbow bent. DIAGNOSIS  Your caregiver may make this diagnosis by a physical exam. X-rays can confirm the type and amount of fracture. Sometimes a fracture that is not displaced cannot be seen on the original X-ray. TREATMENT  Radial head fractures are classified according to the amount of movement (displacement) of parts from the normal position.  Type 1 Fractures  Type 1 fractures are generally small fractures in which bone pieces remain together (nondisplaced fracture).  The fracture may not be seen on initial X-rays. Usually if X-rays are repeated two to three weeks later, the fracture will show up. A splint or sling is used for a few days. Gentle early motion is used to prevent the elbow from becoming stiff. It should not be done vigorously or forced as this could displace the bone pieces. Type 2 Fractures  With type 2 fractures, bone pieces are slightly displaced and larger pieces of bone are broken off.  If only a little displacement of the bone piece is present, splinting for 4 to 5 days usually works well. This is again followed with gentle active range of motion. Small fragments may be surgically removed.  Large pieces of bone that can be put back into place will sometimes be fixed with pins or screws to hold them until the bone is healed. If this cannot be done, the fragments are removed. For older, less active people, sometimes the entire radial head is removed if the wrist is not injured. The elbow and arm will still work fine. Soft tissue, tendon, and ligament injuries are corrected at the same time. Type 3 Fractures  Type 3 fractures have multiple broken pieces of bone that cannot be fixed. Surgery is usually needed to remove the broken bits of bone and what is left of the radial head. Soft-tissue damage is repaired.  Gentle early motion is used to prevent the elbow from becoming stiff. Sometimes an artificial radial head can be used to prevent deformity if the elbow is unstable. Rest, ice, elevation, immobilization, medications, and pain control are used in the early care. HOME CARE INSTRUCTIONS   Keep the injured part elevated while sitting or lying down. Keep the injury above the level of your heart (the center of the chest). This will decrease swelling and pain.  Apply ice to the injury for 15-20 minutes, 03-04 times per day while awake, for 2 days. Put the ice in a plastic bag and place a towel between the bag of ice and your cast or  splint.  Move your fingers to avoid stiffness and minimize swelling.  If you have a plaster or fiberglass cast:  Do not try to scratch the skin under the cast using sharp or pointed objects.  Check the skin around the cast every day. You may put lotion on any red or sore areas.  Keep your cast dry and clean.  If you have a plaster splint:  Wear the splint as directed.  You may loosen the elastic around the splint if your fingers become numb, tingle, or turn cold or blue.  Do not put pressure on any part of your cast or splint. It may break. Rest your cast only on a pillow for the first 24 hours until it is fully hardened.  Your cast or splint can be protected during bathing with a plastic bag. Do not lower the cast or splint into the water.  Only take over-the-counter or prescription medicines for pain, discomfort, or fever as directed by your caregiver.  Follow all instructions for follow-up with your caregiver. This includes any orthopedic referrals, physical therapy, and rehabilitation. Any delay in obtaining necessary care could result in a delay or failure of the bones to heal or permanent elbow stiffness.  Do not overdo exercises. This could further damage your injury. SEEK IMMEDIATE MEDICAL CARE IF:   Your cast or splint gets damaged or breaks.  You  have more severe pain or swelling than you did before getting the cast.  You have severe pain when stretching your fingers.  There is a bad smell, new stains, and/or pus-like (purulent) drainage coming from under the cast.  Your fingers or hand turn pale or blue, become cold, or you lose feeling. Document Released: 11/22/2005 Document Revised: 06/17/2013 Document Reviewed: 12/30/2008 [facility] Patient Information 2015 [place], [place]. This information is not intended to replace advice given to you by your health care provider. Make sure you discuss any questions you have with your health care provider.

## 2013-12-07 NOTE — ED Notes (Signed)
Here from home with spouse, here s/p mechanical fall, tripped over yard tool, fell onto R side, c/o R wrist and elbow pain, (denies: LOC, shoulder, hip, neck, back or head pain; denies: other sx), skin intact, CMS intact, ROM limited, arrives with arm in self made sling. No meds PTA.

## 2013-12-07 NOTE — ED Notes (Signed)
Ortho tech called 

## 2013-12-07 NOTE — ED Notes (Signed)
Ortho at bedside.

## 2013-12-07 NOTE — ED Provider Notes (Signed)
CSN: [id number]     Arrival date & time 12/07/13  1937 History   First MD Initiated Contact with Patient 12/07/13 1955     Chief Complaint  Patient presents with  . Fall  . Elbow Pain  . Wrist Pain     (Consider location/radiation/quality/duration/timing/severity/associated sxs/prior Treatment) HPI  patient states she was outside helping her husband work in the yard and she tripped over some tools and fell. She states she had immediate sharp pain in her right wrist and elbow. She did not hit her head or have loss of consciousness. She denies injury to her lower extremities. Patient states she is able to walk without difficulty. She states she continues to have pain in her right wrist and right elbow. Patient is right handed.   PCP Dr Regis Bill  Past Medical History  Diagnosis Date  . Headache(784.0)   . Recurrent UTI   . Scleritis     hx  . Abnormal thyroid blood test   . Allergic rhinitis   . CTS (carpal tunnel syndrome)    Past Surgical History  Procedure Laterality Date  . Esophagogastroduodenoscopy  3/09   Family History  Problem Relation Age of Onset  . Stroke Father   . Parkinsonism Father   . Other Son     traumatic brain injury 2011  . Thyroid disease    . Diabetes type II Father   . Hypertension    . Hyperlipidemia     History  Substance Use Topics  . Smoking status: Never Smoker   . Smokeless tobacco: Not on file  . Alcohol Use: No   Unemployed Lives with spouse  OB History   Grav Para Term Preterm Abortions TAB SAB Ect Mult Living   6 3             Review of Systems  All other systems reviewed and are negative.     Allergies  Nitrofurantoin monohyd macro and Epinephrine  Home Medications   Prior to Admission medications   Medication Sig Start Date End Date Taking? Authorizing Provider  Cholecalciferol (VITAMIN D-3 PO) Take 1 tablet by mouth daily.   Yes Historical Provider, MD  CRANBERRY PO Take 1 capsule by mouth every other day.   Yes  Historical Provider, MD  levothyroxine (SYNTHROID, LEVOTHROID) 88 MCG tablet Take 1 tablet (88 mcg total) by mouth daily before breakfast. 09/02/13  Yes Burnis Medin, MD  triamcinolone (KENALOG) 0.025 % cream Apply 1 application topically 2 (two) times daily as needed (itching).   Yes Historical Provider, MD  traMADol (ULTRAM) 50 MG tablet Take 1 or 2 po Q 6hrs for pain 12/07/13   Janice Norrie, MD   BP 165/75  Pulse 89  Temp(Src) 98.1 F (36.7 C)  Resp 16  Ht 5\' 2"  (1.575 m)  Wt 196 lb (88.905 kg)  BMI 35.84 kg/m2  SpO2 97%  Vital signs normal   Physical Exam  Nursing note and vitals reviewed. Constitutional: She is oriented to person, place, and time. She appears well-developed and well-nourished.  Non-toxic appearance. She does not appear ill. No distress.  HENT:  Head: Normocephalic and atraumatic.  Right Ear: External ear normal.  Left Ear: External ear normal.  Nose: Nose normal. No mucosal edema or rhinorrhea.  Mouth/Throat: Mucous membranes are normal. No dental abscesses or uvula swelling.  Eyes: Conjunctivae and EOM are normal. Pupils are equal, round, and reactive to light.  Neck: Normal range of motion and full passive range of motion  without pain. Neck supple.  Pulmonary/Chest: Effort normal. No respiratory distress. She has no rhonchi. She exhibits no crepitus.  Abdominal: Normal appearance.  Musculoskeletal: Normal range of motion. She exhibits no edema and no tenderness.   Patient has no pain to palpation of her right shoulder , she has no pain with abduction or abduction of her shoulder.  She has some fullness in her right elbow questionable small joint effusion present. She does not have pain on flexion or extension however when she supinates her hand she has pain in her proximal forearm.   Patient has no pain on dorsiflexion or extension of her wrist. She does however have pain when she supinates her arm. There is no obvious deformity or swelling.   Patient has  intact distal sensation. She has good distal pulses.   Neurological: She is alert and oriented to person, place, and time. She has normal strength. No cranial nerve deficit.  Skin: Skin is warm, dry and intact. No rash noted. No erythema. No pallor.  Psychiatric: She has a normal mood and affect. Her speech is normal and behavior is normal. Her mood appears not anxious.    ED Course  Procedures (including critical care time)  Medications  ibuprofen (ADVIL,MOTRIN) tablet 600 mg (600 mg Oral Given 12/07/13 2034)   patient refused pain medications except for ibuprofen. She was given an ice pack for her injured areas. 20:55Pt given her xray results. She was placed in a sling and posterior splint.   21:27 Dr Alvan Dame through a [facility]-- States to put in a posterior splint which I have already ordered and talk to Dr Burney Gauze who is on hand call, if he doesn't want to see the patient she can come to the office this week and see either Dr Apolonio Schneiders or Dr Amedeo Plenty.   21:54 Dr Burney Gauze will look at [facility] and call me back  21:57 Dr Burney Gauze, put in posterior splint, call the office in 2 days (Monday) to see him and he will do surgery to replace her radial head on Wednesday or Thursday   Labs Review Labs Reviewed - No data to display  Imaging Review Dg Elbow Complete Right  12/07/2013   CLINICAL DATA:  Tripped over a yard tool outside today, fall, RIGHT elbow pain, increased pain with movement  EXAM: RIGHT ELBOW - COMPLETE 3+ VIEW  COMPARISON:  None  FINDINGS: Bones appear demineralized.  Displaced intra-articular RIGHT radial head fracture.  An articular fragment is significantly displaced anteriorly.  Associated radial head deformity.  No additional fractures identified.  IMPRESSION: Displaced intra-articular RIGHT radial head fracture.  Osseous demineralization.   Electronically Signed   By: Lavonia Dana M.D.   On: 12/07/2013 20:31   Dg Wrist Complete Right  12/07/2013   CLINICAL DATA:  Tripped over  your tool outside. Fall, pain to right wrist and right elbow.  EXAM: RIGHT WRIST - COMPLETE 3+ VIEW  COMPARISON:  None.  FINDINGS: There is no evidence of fracture or dislocation. There is no evidence of arthropathy or other focal bone abnormality. Soft tissues are unremarkable.  IMPRESSION: Negative.   Electronically Signed   By: Rolm Baptise M.D.   On: 12/07/2013 20:31     EKG Interpretation None      MDM   Final diagnoses:  Radial head fracture, closed, right, initial encounter  Fall at home, initial encounter    New Prescriptions   TRAMADOL (ULTRAM) 50 MG TABLET    Take 1 or 2 po Q 6hrs  for pain    Plan discharge  Rolland Porter, MD, Alanson Aly, MD 12/07/13 2217

## 2013-12-16 ENCOUNTER — Encounter (HOSPITAL_COMMUNITY): Payer: Self-pay | Admitting: Emergency Medicine

## 2014-01-03 ENCOUNTER — Encounter: Payer: Self-pay | Admitting: Internal Medicine

## 2014-01-03 ENCOUNTER — Ambulatory Visit (INDEPENDENT_AMBULATORY_CARE_PROVIDER_SITE_OTHER): Payer: BC Managed Care – PPO | Admitting: Internal Medicine

## 2014-01-03 VITALS — BP 136/80 | Temp 98.1°F | Wt 199.3 lb

## 2014-01-03 DIAGNOSIS — N3000 Acute cystitis without hematuria: Secondary | ICD-10-CM | POA: Insufficient documentation

## 2014-01-03 DIAGNOSIS — R350 Frequency of micturition: Secondary | ICD-10-CM

## 2014-01-03 DIAGNOSIS — Z79899 Other long term (current) drug therapy: Secondary | ICD-10-CM

## 2014-01-03 DIAGNOSIS — R300 Dysuria: Secondary | ICD-10-CM

## 2014-01-03 LAB — POCT URINALYSIS DIPSTICK
Bilirubin, UA: NEGATIVE
Blood, UA: 1
Glucose, UA: NEGATIVE
Ketones, UA: NEGATIVE
Nitrite, UA: NEGATIVE
Protein, UA: NEGATIVE
SPEC GRAV UA: 1.015
UROBILINOGEN UA: 0.2
pH, UA: 7

## 2014-01-03 MED ORDER — LEVOTHYROXINE SODIUM 88 MCG PO TABS: 88.0000 ug | ORAL_TABLET | Freq: Every day | ORAL | Status: DC

## 2014-01-03 MED ORDER — SULFAMETHOXAZOLE-TRIMETHOPRIM 800-160 MG PO TABS: 1.0000 | ORAL_TABLET | Freq: Two times a day (BID) | ORAL | Status: DC

## 2014-01-03 NOTE — Patient Instructions (Signed)
This  Is a uti  Sulfa medication at this time for 3-5 days  Contact us if not resolved at end of med.  cipro type meds have a caution regarding tendon problems and  Numbness neuropathy problems   So fi can be cured by other means we use those meds first .

## 2014-01-03 NOTE — Progress Notes (Signed)
Pre visit review using our clinic review tool, if applicable. No additional management support is needed unless otherwise documented below in the visit note.  Chief Complaint  Patient presents with  . Dysuria    X2DAYS  . Urinary Frequency    HPI: Patient Jackie Lewis  comes in today for SDA for  new problem evaluation. Onset less than 48 hours waxing and waning .  UTIs  sx   No fever  Had  Surgery for fall tendon reattachment radial head .  [facility] .  Right  ROS: See pertinent positives and negatives per HPI. Has uri sx mild cough no fever.   Past Medical History  Diagnosis Date  . Headache(784.0)   . Recurrent UTI   . Scleritis     hx  . Abnormal thyroid blood test   . Allergic rhinitis   . CTS (carpal tunnel syndrome)     Family History  Problem Relation Age of Onset  . Stroke Father   . Parkinsonism Father   . Other Son     traumatic brain injury 2011  . Thyroid disease    . Diabetes type II Father   . Hypertension    . Hyperlipidemia      History   Social History  . Marital Status: Married    Spouse Name: N/A    Number of Children: N/A  . Years of Education: N/A   Social History Main Topics  . Smoking status: Never Smoker   . Smokeless tobacco: None  . Alcohol Use: No  . Drug Use: No  . Sexual Activity: None   Other Topics Concern  . 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    [place] country of origin   Bereaved parent   Son  speaking some  Walks feeds sabnd dressed self  Can go to gym for som exercise      Hh  of 4  . Daughter graduated from [employer] and is back home this month. To go to grad school in [place]    Outpatient Encounter Prescriptions as of 01/03/2014  Medication Sig  . Cholecalciferol (VITAMIN D-3 PO) Take 1 tablet by mouth  daily.  Marland Kitchen CRANBERRY PO Take 1 capsule by mouth every other day.  . levothyroxine (SYNTHROID, LEVOTHROID) 88 MCG tablet Take 1 tablet (88 mcg total) by mouth daily before breakfast.  . triamcinolone (KENALOG) 0.025 % cream Apply 1 application topically 2 (two) times daily as needed (itching).  . [DISCONTINUED] levothyroxine (SYNTHROID, LEVOTHROID) 88 MCG tablet Take 1 tablet (88 mcg total) by mouth daily before breakfast.  . sulfamethoxazole-trimethoprim (SEPTRA DS) 800-160 MG per tablet Take 1 tablet by mouth 2 (two) times daily.  . [DISCONTINUED] traMADol (ULTRAM) 50 MG tablet Take 1 or 2 po Q 6hrs for pain    EXAM:  BP 136/80 mmHg  Temp(Src) 98.1 F (36.7 C) (Oral)  Wt 199 lb 4.8 oz (90.402 kg)  Body mass index is 36.44 kg/(m^2).  GENERAL: vitals reviewed and listed above, alert, oriented, appears well hydrated and in no acute distress NECK: no obvious masses on inspection palpation  Ok clear tms clear  LUNGS: clear to auscultation bilaterally, no wheezes, rales or  rhonchi, good air movement CV: HRRR, no clubbing cyanosis or  peripheral edema nl cap refill  No flank pain cva pain  MS: right arm in brace  PSYCH: pleasant and cooperative, no obvious depression or anxiety ua 1 + leuk and 1+ blood  ASSESSMENT AND PLAN:  Discussed the following assessment and plan:  Acute cystitis without hematuria  Dysuria - Plan: POC Urinalysis Dipstick, Urine culture  Urinary frequency - Plan: POC Urinalysis Dipstick, Urine culture Empiric rx hx sx  Trail non quinolone if poss -Patient advised to return or notify health care team  if symptoms worsen ,persist or new concerns arise.  Patient Instructions  This  Is a uti  Sulfa medication at this time for 3-5 days  Contact us if not resolved at end of med.  cipro type meds have a caution regarding tendon problems and  Numbness neuropathy problems   So fi can be cured by other means we use those meds first .    Standley Brooking. Cristabel Bicknell M.D.

## 2014-01-05 LAB — URINE CULTURE: Colony Count: 9000

## 2014-05-12 ENCOUNTER — Other Ambulatory Visit: Payer: Self-pay | Admitting: Internal Medicine

## 2014-05-12 NOTE — Telephone Encounter (Signed)
Sent to the pharmacy by e-scribe. 

## 2014-07-15 ENCOUNTER — Telehealth: Payer: Self-pay | Admitting: Internal Medicine

## 2014-07-15 NOTE — Telephone Encounter (Signed)
Error/njr °

## 2014-09-01 ENCOUNTER — Ambulatory Visit (INDEPENDENT_AMBULATORY_CARE_PROVIDER_SITE_OTHER): Payer: BC Managed Care – PPO | Admitting: Family Medicine

## 2014-09-01 ENCOUNTER — Telehealth: Payer: Self-pay | Admitting: *Deleted

## 2014-09-01 ENCOUNTER — Encounter: Payer: Self-pay | Admitting: Family Medicine

## 2014-09-01 VITALS — BP 119/74 | HR 87 | Temp 98.5°F | Ht 62.0 in | Wt 197.0 lb

## 2014-09-01 DIAGNOSIS — N3000 Acute cystitis without hematuria: Secondary | ICD-10-CM

## 2014-09-01 DIAGNOSIS — R309 Painful micturition, unspecified: Secondary | ICD-10-CM | POA: Diagnosis not present

## 2014-09-01 LAB — POCT URINALYSIS DIPSTICK
BILIRUBIN UA: NEGATIVE
Glucose, UA: NEGATIVE
Ketones, UA: NEGATIVE
Nitrite, UA: NEGATIVE
PROTEIN UA: NEGATIVE
Spec Grav, UA: 1.015
Urobilinogen, UA: 0.2
pH, UA: 7

## 2014-09-01 MED ORDER — CIPROFLOXACIN HCL 500 MG PO TABS: 500.0000 mg | ORAL_TABLET | Freq: Two times a day (BID) | ORAL | Status: DC

## 2014-09-01 NOTE — Progress Notes (Signed)
Pre visit review using our clinic review tool, if applicable. No additional management support is needed unless otherwise documented below in the visit note. 

## 2014-09-01 NOTE — Telephone Encounter (Signed)
Pt coming to see Dr. Sarajane Jews today at 11:30 AM.

## 2014-09-01 NOTE — Telephone Encounter (Signed)
[facility] Night - Client [facility] Patient Name: Jackie Lewis Gender: Female DOB: 1956/01/07 Age: 59 Y 52 M 13 D Return Phone Number: [phone] (Primary) Address: City/State/Zip: [place] Client [facility] Night - Client Client Site [facility] - Night Physician Shanon Ace Contact Type Call Call Type Triage / Clinical Relationship To Patient Self Return Phone Number [phone] (Primary) Chief Complaint Urination Pain Initial Comment caller states she has UTI PreDisposition Call Doctor Nurse Assessment Nurse: Buck Mam, RN, Trish Date/Time Eilene Ghazi Time): 08/30/2014 1:13:07 PM Confirm and document reason for call. If symptomatic, describe symptoms. ---Patient is calling for self for c/o frequency burning and urgency. No temp that patient is aware of she does have chills. No blood in urine. Has the patient traveled out of the country within the last 30 days? ---No Does the patient require triage? ---Yes Related visit to physician within the last 2 weeks? ---Yes Does the PT have any chronic conditions? (i.e. diabetes, asthma, etc.) ---Yes List chronic conditions. ---had a tear in muscle in knee is in therapy No illness to take medication for. Guidelines Guideline Title Affirmed Question Affirmed Notes Nurse Date/Time Eilene Ghazi Time) Urinary Symptoms Urinating more frequently than usual (i.e., frequency) Maples, RN, Trish 08/30/2014 1:15:56 PM Disp. Time Eilene Ghazi Time) Disposition Final User 08/30/2014 12:39:55 PM Send To Clinical Follow Up Jeral Fruit 08/30/2014 12:45:35 PM Attempt made - message left Buck Mam, RN, Trish 08/30/2014 1:16:46 PM See Physician within 24 Hours Yes Buck Mam, RN, Particia Lather Understands: Yes Disagree/Comply: Comply PLEASE NOTE: All timestamps contained within this report are represented as [place] Standard Time. CONFIDENTIALTY NOTICE:  This fax transmission is intended only for the addressee. It contains information that is legally privileged, confidential or otherwise protected from use or disclosure. If you are not the intended recipient, you are strictly prohibited from reviewing, disclosing, copying using or disseminating any of this information or taking any action in reliance on or regarding this information. If you have received this fax in error, please notify us immediately by telephone so that we can arrange for its return to [place]. Phone: [phone], Toll-Free: [phone], Fax: [phone] Page: 2 of 2 Call Id: [id number] Care Advice Given Per Guideline SEE PHYSICIAN WITHIN 24 HOURS: CALL BACK IF: * Fever occurs * Unable to urinate and bladder feels full * You become worse. CARE ADVICE given per Urinary Symptoms (Adult) guideline. After Care Instructions Given Call Event Type User Date / Time Description Referrals [facility]

## 2014-09-01 NOTE — Telephone Encounter (Signed)
She was seen today

## 2014-09-01 NOTE — Progress Notes (Signed)
   Subjective:    Patient ID: Jackie Lewis, female    DOB: 10-06-1955, 59 y.o.   MRN: [id number]  HPI Here with what she thinks is another UTI. She averages 1 or 2 of these a year. For the past 3 days she has had urinary burning and urgency. No fever. Using cranberry pills and drinking water.    Review of Systems  Constitutional: Negative.   Genitourinary: Positive for dysuria, urgency and frequency. Negative for hematuria, flank pain and pelvic pain.       Objective:   Physical Exam  Constitutional: She appears well-developed and well-nourished.  Abdominal: Soft. Bowel sounds are normal. She exhibits no distension and no mass. There is no tenderness. There is no rebound and no guarding.          Assessment & Plan:  UTI, treat with Cipro. A culture is pending.

## 2014-09-01 NOTE — Telephone Encounter (Signed)
Left a message for a return care.  Need to see if pt was seen for symptoms.

## 2014-09-04 LAB — URINE CULTURE: Colony Count: >=100000

## 2014-09-12 ENCOUNTER — Other Ambulatory Visit: Payer: BC Managed Care – PPO

## 2014-10-07 ENCOUNTER — Other Ambulatory Visit (INDEPENDENT_AMBULATORY_CARE_PROVIDER_SITE_OTHER): Payer: BC Managed Care – PPO

## 2014-10-07 DIAGNOSIS — Z0000 Encounter for general adult medical examination without abnormal findings: Secondary | ICD-10-CM

## 2014-10-07 LAB — HEPATIC FUNCTION PANEL
ALBUMIN: 4.1 g/dL (ref 3.5–5.2)
ALT: 20 U/L (ref 0–35)
AST: 18 U/L (ref 0–37)
Alkaline Phosphatase: 64 U/L (ref 39–117)
BILIRUBIN TOTAL: 0.4 mg/dL (ref 0.2–1.2)
Bilirubin, Direct: 0.1 mg/dL (ref 0.0–0.3)
TOTAL PROTEIN: 7.6 g/dL (ref 6.0–8.3)

## 2014-10-07 LAB — CBC WITH DIFFERENTIAL/PLATELET
BASOS PCT: 0.5 % (ref 0.0–3.0)
Basophils Absolute: 0 10*3/uL (ref 0.0–0.1)
Eosinophils Absolute: 0.2 10*3/uL (ref 0.0–0.7)
Eosinophils Relative: 3.8 % (ref 0.0–5.0)
HCT: 37.1 % (ref 36.0–46.0)
HEMOGLOBIN: 12.5 g/dL (ref 12.0–15.0)
Lymphocytes Relative: 23 % (ref 12.0–46.0)
Lymphs Abs: 1.5 10*3/uL (ref 0.7–4.0)
MCHC: 33.8 g/dL (ref 30.0–36.0)
MCV: 85.1 fl (ref 78.0–100.0)
MONO ABS: 0.7 10*3/uL (ref 0.1–1.0)
MONOS PCT: 10 % (ref 3.0–12.0)
Neutro Abs: 4.1 10*3/uL (ref 1.4–7.7)
Neutrophils Relative %: 62.7 % (ref 43.0–77.0)
Platelets: 203 10*3/uL (ref 150.0–400.0)
RBC: 4.36 Mil/uL (ref 3.87–5.11)
RDW: 14.7 % (ref 11.5–15.5)
WBC: 6.5 10*3/uL (ref 4.0–10.5)

## 2014-10-07 LAB — BASIC METABOLIC PANEL
BUN: 13 mg/dL (ref 6–23)
CHLORIDE: 102 meq/L (ref 96–112)
CO2: 29 mEq/L (ref 19–32)
Calcium: 9.7 mg/dL (ref 8.4–10.5)
Creatinine, Ser: 0.65 mg/dL (ref 0.40–1.20)
GFR: 98.66 mL/min (ref >60.00)
GLUCOSE: 98 mg/dL (ref 70–99)
POTASSIUM: 4.1 meq/L (ref 3.5–5.1)
SODIUM: 139 meq/L (ref 135–145)

## 2014-10-07 LAB — TSH: TSH: 3 u[IU]/mL (ref 0.35–4.50)

## 2014-10-07 LAB — LIPID PANEL
Cholesterol: 190 mg/dL (ref 0–200)
HDL: 66.7 mg/dL (ref >39.00)
LDL Cholesterol: 108 mg/dL — ABNORMAL HIGH (ref 0–99)
NONHDL: 123.34
Total CHOL/HDL Ratio: 3
Triglycerides: 79 mg/dL (ref 0.0–149.0)
VLDL: 15.8 mg/dL (ref 0.0–40.0)

## 2014-10-14 ENCOUNTER — Encounter: Payer: Self-pay | Admitting: Internal Medicine

## 2014-10-14 ENCOUNTER — Ambulatory Visit (INDEPENDENT_AMBULATORY_CARE_PROVIDER_SITE_OTHER): Payer: BC Managed Care – PPO | Admitting: Internal Medicine

## 2014-10-14 VITALS — BP 132/74 | Temp 98.1°F | Ht 60.75 in | Wt 196.9 lb

## 2014-10-14 DIAGNOSIS — E039 Hypothyroidism, unspecified: Secondary | ICD-10-CM

## 2014-10-14 DIAGNOSIS — Z0000 Encounter for general adult medical examination without abnormal findings: Secondary | ICD-10-CM | POA: Diagnosis not present

## 2014-10-14 DIAGNOSIS — Z79899 Other long term (current) drug therapy: Secondary | ICD-10-CM

## 2014-10-14 DIAGNOSIS — Z8781 Personal history of (healed) traumatic fracture: Secondary | ICD-10-CM

## 2014-10-14 NOTE — Progress Notes (Signed)
Pre visit review using our clinic review tool, if applicable. No additional management support is needed unless otherwise documented below in the visit note.  Chief Complaint  Patient presents with  . Annual Exam    HPI: Patient  Jackie Lewis  59 y.o. comes in today for Preventive Health Care visit she has hypothyroidism and is recurrent UTIs recently treated and recovered from her fractured right arm treated at [facility]. She went through rehabilitation in regard to musculoskeletal issues. They advised she get a bone density because of her fracture. Family history negative for osteoporosis fracture in her mother who lived to almost 72. Sister did have osteoporosis. righ tknee arthritis cartilage problem better with PT at [facility] She is taking her thyroid medicine with a little bit of food or cheese because she states it causes nausea first thing in the morning. She feels she gets enough vitamin D and calcium is taking a vitamin D supplement. 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 He is now eating on his own and walking has occasional seizures but  he cant  be left alone. Doesn't have extra help although family tries to help out when she has to go to visits.  Health Maintenance  Topic Date Due  . ZOSTAVAX  04/17/2014  . INFLUENZA VACCINE  10/14/2015 (Originally 09/15/2014)  . HIV Screening  10/14/2015 (Originally 04/16/1969)  . COLONOSCOPY  05/03/2015  . MAMMOGRAM  09/28/2015  . TETANUS/TDAP  05/30/2018  . Hepatitis C Screening  Completed   Health Maintenance Review LIFESTYLE:  Exercise:  activeTobacco/ETS:noAlcohol: per day  Sugar beverages:minimal Sleep: not continuous  Caretaker and disrupted  Drug use: no  ROS:  hdoes not have full rom of right  elbow GEN/ HEENT: No fever, significant weight changes sweats headaches vision problems hearing changes, CV/ PULM; No chest pain shortness  of breath cough, syncope,edema  change in exercise tolerance. GI /GU: No adominal pain, vomiting, change in bowel habits. No blood in the stool. No significant GU symptoms. SKIN/HEME: ,no acute skin rashes suspicious lesions or bleeding. No lymphadenopathy, nodules, masses.  NEURO/ PSYCH:  No neurologic signs such as weakness numbness. No depression anxiety. IMM/ Allergy: No unusual infections.  Allergy .   REST of 12 system review negative except as per HPI   Past Medical History  Diagnosis Date  . Headache(784.0)   . Recurrent UTI   . Scleritis     hx  . Abnormal thyroid blood test   . Allergic rhinitis   . CTS (carpal tunnel syndrome)     Past Surgical History  Procedure Laterality Date  . Esophagogastroduodenoscopy  3/09    Family History  Problem Relation Age of Onset  . Stroke Father   . Parkinsonism Father   . Other Son     traumatic brain injury 2011  . Thyroid disease    . Diabetes type II Father   . Hypertension    . Hyperlipidemia      Social History   Social History  . Marital Status: Married    Spouse Name: N/A  . Number of Children: N/A  . Years of Education: N/A   Social History Main Topics  . Smoking status: Never Smoker   . Smokeless tobacco: None  . Alcohol Use: No  . Drug Use: No  . Sexual Activity: Not Asked   Other Topics Concern  . 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    [place] country of origin   Bereaved parent   Son  speaking some  Walks feeds sabnd dressed self  Can go to gym for som exercise      Hh  of 3 . Daughter graduated from [employer]  grad school in [employer]    Daughter now teaching at [employer]   [phone]    Outpatient Prescriptions Prior to Visit  Medication Sig Dispense Refill  . Cholecalciferol (VITAMIN D-3 PO) Take 1 tablet by  mouth daily.    Marland Kitchen CRANBERRY PO Take 1 capsule by mouth every other day.    . levothyroxine (SYNTHROID, LEVOTHROID) 88 MCG tablet TAKE 1 TABLET BY MOUTH ONCE DAILY BEFORE BREAKFAST 90 tablet 2  . triamcinolone (KENALOG) 0.025 % cream Apply 1 application topically 2 (two) times daily as needed (itching).    . ciprofloxacin (CIPRO) 500 MG tablet Take 1 tablet (500 mg total) by mouth 2 (two) times daily. 14 tablet 0   No facility-administered medications prior to visit.     EXAM:  BP 132/74 mmHg  Temp(Src) 98.1 F (36.7 C) (Oral)  Ht 5' 0.75" (1.543 m)  Wt 196 lb 14.4 oz (89.313 kg)  BMI 37.51 kg/m2  Body mass index is 37.51 kg/(m^2).  Physical Exam: Vital signs reviewed FAO:ZHYQ is a well-developed well-nourished alert cooperative    who appearsr stated age in no acute distress.  HEENT: normocephalic atraumatic , Eyes: PERRL EOM's full, conjunctiva clear, Nares: paten,t no deformity discharge or tenderness., Ears: no deformity EAC's clear TMs with normal landmarks. Mouth: clear OP, no lesions, edema.  Moist mucous membranes. Dentition in adequate repair. NECK: supple without masses, thyromegaly or bruits. CHEST/PULM:  Clear to auscultation and percussion breath sounds equal no wheeze , rales or rhonchi. No chest wall deformities or tenderness. Breast: normal by inspection . No dimpling, discharge, masses, tenderness or discharge . CV: PMI is nondisplaced, S1 S2 no gallops, murmurs, rubs. Peripheral pulses are full without delay.No JVD .  ABDOMEN: Bowel sounds normal nontender  No guard or rebound, no hepato splenomegal no CVA tenderness.  No hernia. Extremtities:  No clubbing cyanosis or edema, no acute joint swelling or redness no focal atrophy righ arm not full extension NEURO:  Oriented x3, cranial nerves 3-12 appear to be intact, no obvious focal weakness,gait within normal limits no abnormal reflexes or asymmetrical SKIN: No acute rashes normal turgor, color, no bruising or  petechiae. PSYCH: Oriented, good eye contact, no obvious depression anxiety, cognition and judgment appear normal. LN: no cervical axillary inguinal adenopathy  Lab Results  Component Value Date   WBC 6.5 10/07/2014   HGB 12.5 10/07/2014   HCT 37.1 10/07/2014   PLT 203.0 10/07/2014   GLUCOSE 98 10/07/2014   CHOL 190 10/07/2014   TRIG 79.0 10/07/2014   HDL 66.70 10/07/2014   LDLDIRECT 113.9 06/22/2010   LDLCALC 108* 10/07/2014   ALT 20 10/07/2014   AST 18 10/07/2014   NA 139 10/07/2014   K 4.1 10/07/2014   CL 102 10/07/2014   CREATININE 0.65 10/07/2014   BUN 13 10/07/2014   CO2 29 10/07/2014   TSH 3.00 10/07/2014   HGBA1C 5.4 07/17/2008   MICROALBUR 0.7 04/14/2006  Wt Readings from Last 3 Encounters:  10/14/14 196 lb 14.4 oz (89.313 kg)  09/01/14 197 lb (89.359 kg)  01/03/14 199 lb 4.8 oz (90.402 kg)   BP Readings from Last 3 Encounters:  10/14/14 132/74  09/01/14 119/74  01/03/14 136/80     ASSESSMENT AND PLAN:  Discussed the following assessment and plan:  Visit for preventive health examination  Hypothyroidism, unspecified hypothyroidism type - Plan: DG Bone Density  Medication management  Hx of fracture of arm - dsic  check dexa - Plan: DG Bone Density zostavax .    Will check into this .    Reimbursement  Patient Care Team: Burnis Medin, MD as PCP - General ent ortho  Patient Instructions  Continue lifestyle intervention healthy eating and exercise .  Schedule bone density .  Will get you results or view on line . Get adequate vit d 800 IU per day and  Calcium 800 - 1200 mg per day in diet or supplements.  Try to take the thryoid med away from food. Do not take it with the calcium or vitamins.    Get a flu vaccine this fall before end of October. Bone Densitometry Bone densitometry is a special X-ray that measures your bone density and can be used to help predict your risk of bone fractures. This test is used to determine bone mineral  content and density to diagnose osteoporosis. Osteoporosis is the loss of bone that may cause the bone to become weak. Osteoporosis commonly occurs in women entering menopause. However, it may be found in men and in people with other diseases. PREPARATION FOR TEST No preparation necessary. WHO SHOULD BE TESTED?  All women older than 32.  Postmenopausal women (50 to 49) with risk factors for osteoporosis.  People with a previous fracture caused by normal activities.  People with a small body frame (less than 127 poundsor a body mass index [BMI] of less than 21).  People who have a parent with a hip fracture or history of osteoporosis.  People who smoke.  People who have rheumatoid arthritis.  Anyone who engages in excessive alcohol use (more than 3 drinks most days).  Women who experience early menopause. WHEN SHOULD YOU BE RETESTED? Current guidelines suggest that you should wait at least 2 years before doing a bone density test again if your first test was normal.Recent studies indicated that women with normal bone density may be able to wait a few years before needing to repeat a bone density test. You should discuss this with your caregiver.  NORMAL FINDINGS   Normal: less than standard deviation below normal (greater than -1).  Osteopenia: 1 to 2.5 standard deviations below normal (-1 to -2.5).  Osteoporosis: greater than 2.5 standard deviations below normal (less than -2.5). Test results are reported as a "T score" and a "Z score."The T score is a number that compares your bone density with the bone density of healthy, young women.The Z score is a number that compares your bone density with the scores of women who are the same age, gender, and race.  Ranges for normal findings may vary among different laboratories and hospitals. You should always check with your doctor after having lab work or other tests done to discuss the meaning of your test results and whether your  values are considered within normal limits. MEANING OF TEST  Your caregiver will go over the test results with you and discuss the importance and meaning of your results, as well as  treatment options and the need for additional tests if necessary. OBTAINING THE TEST RESULTS It is your responsibility to obtain your test results. Ask the lab or department performing the test when and how you will get your results. Document Released: 02/23/2004 Document Revised: 04/25/2011 Document Reviewed: 03/17/2010 [facility] Patient Information 2015 [place], [place]. This information is not intended to replace advice given to you by your health care provider. Make sure you discuss any questions you have with your health care provider.     Standley Brooking. Junaid Wurzer M.D.

## 2014-10-14 NOTE — Patient Instructions (Signed)
Continue lifestyle intervention healthy eating and exercise .  Schedule bone density .  Will get you results or view on line . Get adequate vit d 800 IU per day and  Calcium 800 - 1200 mg per day in diet or supplements.  Try to take the thryoid med away from food. Do not take it with the calcium or vitamins.    Get a flu vaccine this fall before end of October. Bone Densitometry Bone densitometry is a special X-ray that measures your bone density and can be used to help predict your risk of bone fractures. This test is used to determine bone mineral content and density to diagnose osteoporosis. Osteoporosis is the loss of bone that may cause the bone to become weak. Osteoporosis commonly occurs in women entering menopause. However, it may be found in men and in people with other diseases. PREPARATION FOR TEST No preparation necessary. WHO SHOULD BE TESTED?  All women older than 73.  Postmenopausal women (50 to 41) with risk factors for osteoporosis.  People with a previous fracture caused by normal activities.  People with a small body frame (less than 127 poundsor a body mass index [BMI] of less than 21).  People who have a parent with a hip fracture or history of osteoporosis.  People who smoke.  People who have rheumatoid arthritis.  Anyone who engages in excessive alcohol use (more than 3 drinks most days).  Women who experience early menopause. WHEN SHOULD YOU BE RETESTED? Current guidelines suggest that you should wait at least 2 years before doing a bone density test again if your first test was normal.Recent studies indicated that women with normal bone density may be able to wait a few years before needing to repeat a bone density test. You should discuss this with your caregiver.  NORMAL FINDINGS   Normal: less than standard deviation below normal (greater than -1).  Osteopenia: 1 to 2.5 standard deviations below normal (-1 to -2.5).  Osteoporosis: greater than  2.5 standard deviations below normal (less than -2.5). Test results are reported as a "T score" and a "Z score."The T score is a number that compares your bone density with the bone density of healthy, young women.The Z score is a number that compares your bone density with the scores of women who are the same age, gender, and race.  Ranges for normal findings may vary among different laboratories and hospitals. You should always check with your doctor after having lab work or other tests done to discuss the meaning of your test results and whether your values are considered within normal limits. MEANING OF TEST  Your caregiver will go over the test results with you and discuss the importance and meaning of your results, as well as treatment options and the need for additional tests if necessary. OBTAINING THE TEST RESULTS It is your responsibility to obtain your test results. Ask the lab or department performing the test when and how you will get your results. Document Released: 02/23/2004 Document Revised: 04/25/2011 Document Reviewed: 03/17/2010 [facility] Patient Information 2015 [place], [place]. This information is not intended to replace advice given to you by your health care provider. Make sure you discuss any questions you have with your health care provider.

## 2014-10-15 ENCOUNTER — Encounter: Payer: BC Managed Care – PPO | Admitting: Internal Medicine

## 2014-10-24 ENCOUNTER — Ambulatory Visit (INDEPENDENT_AMBULATORY_CARE_PROVIDER_SITE_OTHER)
Admission: RE | Admit: 2014-10-24 | Discharge: 2014-10-24 | Disposition: A | Discharge status: Home / Self Care | Payer: BC Managed Care – PPO | Source: Ambulatory Visit | Attending: Internal Medicine | Admitting: Internal Medicine

## 2014-10-24 DIAGNOSIS — Z8781 Personal history of (healed) traumatic fracture: Secondary | ICD-10-CM

## 2014-10-24 DIAGNOSIS — E039 Hypothyroidism, unspecified: Secondary | ICD-10-CM | POA: Diagnosis not present

## 2014-10-28 ENCOUNTER — Other Ambulatory Visit: Payer: Self-pay

## 2014-10-28 DIAGNOSIS — Z1231 Encounter for screening mammogram for malignant neoplasm of breast: Secondary | ICD-10-CM

## 2014-11-04 ENCOUNTER — Ambulatory Visit
Admission: RE | Admit: 2014-11-04 | Discharge: 2014-11-04 | Disposition: A | Discharge status: Home / Self Care | Payer: BC Managed Care – PPO | Source: Ambulatory Visit

## 2014-11-04 DIAGNOSIS — Z1231 Encounter for screening mammogram for malignant neoplasm of breast: Secondary | ICD-10-CM

## 2014-12-30 ENCOUNTER — Telehealth: Payer: Self-pay | Admitting: Internal Medicine

## 2014-12-30 NOTE — Telephone Encounter (Signed)
Need  A medical reason  Or if  For care taking for her son   Then her sons special sits could write the letter excuse

## 2014-12-30 NOTE — Telephone Encounter (Signed)
Patient has been summoned for jury duty and patient would like for the MD to write a letter stating that she can not do jury duty. I have made a copy of the summoned. Patient would like the letter to be faxed to Janit Pagan [phone] and she can also be reached by phone [phone]

## 2014-12-30 NOTE — Telephone Encounter (Signed)
Left a message for a return call.

## 2014-12-31 NOTE — Telephone Encounter (Signed)
Please document what was discussed with the pt.

## 2015-01-01 NOTE — Telephone Encounter (Signed)
I spoke with Mrs Goldinger and explained that her sons PCP or a treating specialist could be contacted for said letter. I called Dr Charm Barges office and left a message (they were closed) that mom needs a letter for jury duty and she would be contacting them for the letter today.

## 2015-01-15 ENCOUNTER — Other Ambulatory Visit: Payer: Self-pay | Admitting: Internal Medicine

## 2015-02-23 ENCOUNTER — Other Ambulatory Visit: Payer: Self-pay | Admitting: Internal Medicine

## 2015-04-07 ENCOUNTER — Encounter: Payer: Self-pay | Admitting: Gastroenterology

## 2015-04-16 ENCOUNTER — Encounter: Payer: Self-pay | Admitting: Internal Medicine

## 2015-05-01 ENCOUNTER — Ambulatory Visit (INDEPENDENT_AMBULATORY_CARE_PROVIDER_SITE_OTHER): Payer: BC Managed Care – PPO | Admitting: Adult Health

## 2015-05-01 ENCOUNTER — Encounter: Payer: Self-pay | Admitting: Adult Health

## 2015-05-01 VITALS — BP 120/70 | HR 94 | Temp 98.2°F | Wt 202.0 lb

## 2015-05-01 DIAGNOSIS — J209 Acute bronchitis, unspecified: Secondary | ICD-10-CM | POA: Diagnosis not present

## 2015-05-01 MED ORDER — HYDROCODONE-HOMATROPINE 5-1.5 MG/5ML PO SYRP: 5.0000 mL | ORAL_SOLUTION | Freq: Three times a day (TID) | ORAL | Status: DC | PRN

## 2015-05-01 MED ORDER — PREDNISONE 10 MG PO TABS: 10.0000 mg | ORAL_TABLET | Freq: Every day | ORAL | Status: DC

## 2015-05-01 NOTE — Progress Notes (Signed)
Pre visit review using our clinic review tool, if applicable. No additional management support is needed unless otherwise documented below in the visit note. 

## 2015-05-01 NOTE — Progress Notes (Signed)
Subjective:    Patient ID: Jackie Lewis, female    DOB: 03/02/1955, 60 y.o.   MRN: [id number]  Cough This is a new problem. The current episode started 1 to 4 weeks ago. The problem has been unchanged. The problem occurs constantly. The cough is non-productive. Associated symptoms include ear congestion and wheezing. Pertinent negatives include no fever, headaches, nasal congestion, postnasal drip, rhinorrhea or shortness of breath. Nothing aggravates the symptoms. She has tried nothing for the symptoms.    Review of Systems  Constitutional: Negative.  Negative for fever.  HENT: Negative for postnasal drip and rhinorrhea.   Respiratory: Positive for cough and wheezing. Negative for shortness of breath.   Skin: Negative.   Neurological: Negative for headaches.  Hematological: Negative.   All other systems reviewed and are negative.  Past Medical History  Diagnosis Date  . Headache(784.0)   . Recurrent UTI   . Scleritis     hx  . Abnormal thyroid blood test   . Allergic rhinitis   . CTS (carpal tunnel syndrome)     Social History   Social History  . Marital Status: Married    Spouse Name: N/A  . Number of Children: N/A  . Years of Education: N/A   Occupational History  . Not on file.   Social History Main Topics  . Smoking status: Never Smoker   . Smokeless tobacco: Not on file  . Alcohol Use: No  . Drug Use: No  . Sexual Activity: Not on file   Other Topics Concern  . 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    [place] country of origin   Bereaved parent   Son  speaking some  Walks feeds sabnd dressed self  Can go to gym for som exercise      Hh  of 3 . Daughter graduated from [employer]  grad school in [employer]    Daughter now teaching at [employer]    Past  Surgical History  Procedure Laterality Date  . Esophagogastroduodenoscopy  3/09    Family History  Problem Relation Age of Onset  . Stroke Father   . Parkinsonism Father   . Other Son     traumatic brain injury 2011  . Thyroid disease    . Diabetes type II Father   . Hypertension    . Hyperlipidemia      Allergies  Allergen Reactions  . Nitrofurantoin Monohyd Macro Rash  . Epinephrine     REACTION: dental injection got tachycardia    Current Outpatient Prescriptions on File Prior to Visit  Medication Sig Dispense Refill  . Cholecalciferol (VITAMIN D-3 PO) Take 1 tablet by mouth daily.    Marland Kitchen ibuprofen (ADVIL,MOTRIN) 400 MG tablet Take by mouth. USING PRN FOR KNEE PAIN    . levothyroxine (SYNTHROID, LEVOTHROID) 88 MCG tablet TAKE 1 TABLET BY MOUTH ONCE DAILY BEFORE BREAKFAST 90 tablet 2  . triamcinolone (KENALOG) 0.025 % cream Apply 1 application topically 2 (two) times daily as needed (itching).    . triamcinolone (KENALOG) 0.025 % cream APPLY TO AFFECTED AREA TWICE DAILY AS DIRECTED 80 g 0  . CRANBERRY PO Take 1 capsule by mouth every other day. Reported  on 05/01/2015     No current facility-administered medications on file prior to visit.    BP 120/70 mmHg  Pulse 94  Temp(Src) 98.2 F (36.8 C) (Oral)  Wt 202 lb (91.627 kg)  SpO2 97%       Objective:   Physical Exam  Constitutional: She is oriented to person, place, and time. She appears well-developed and well-nourished. No distress.  HENT:  Head: Normocephalic and atraumatic.  Right Ear: Hearing, tympanic membrane, external ear and ear canal normal.  Left Ear: Hearing, external ear and ear canal normal. Tympanic membrane is scarred.  Nose: Nose normal.  Mouth/Throat: Oropharynx is clear and moist.  Cardiovascular: Normal rate, regular rhythm, normal heart sounds and intact distal pulses.  Exam reveals no gallop and no friction rub.   No murmur heard. Pulmonary/Chest: Effort normal and breath sounds normal. No  respiratory distress. She has no wheezes. She has no rales. She exhibits no tenderness.  Neurological: She is alert and oriented to person, place, and time.  Skin: Skin is warm and dry. No rash noted. She is not diaphoretic. No erythema. No pallor.  Psychiatric: She has a normal mood and affect. Her behavior is normal. Judgment and thought content normal.  Nursing note and vitals reviewed.      Assessment & Plan:  1. Acute bronchitis, unspecified organism - predniSONE (DELTASONE) 10 MG tablet; Take 1 tablet (10 mg total) by mouth daily with breakfast. 40 mg x 3 days, 20 mg x 3 days, 10 mg x 3 days  Dispense: 21 tablet; Refill: 0 - HYDROcodone-homatropine (HYCODAN) 5-1.5 MG/5ML syrup; Take 5 mLs by mouth every 8 (eight) hours as needed for cough.  Dispense: 120 mL; Refill: 0 - Follow up in 2-3 days if no improvement

## 2015-05-01 NOTE — Patient Instructions (Signed)
It was great seeing you today.  I have sent in a prescription for prednisone. Take as directed: 40 mg x 3 days, 20 mg x 3 days, 10 mg x 3 days.   Use the cough syrup at night.   During the day, Delsym and/or Mucinex is best.      Acute Bronchitis Bronchitis is inflammation of the airways that extend from the windpipe into the lungs (bronchi). The inflammation often causes mucus to develop. This leads to a cough, which is the most common symptom of bronchitis.  In acute bronchitis, the condition usually develops suddenly and goes away over time, usually in a couple weeks. Smoking, allergies, and asthma can make bronchitis worse. Repeated episodes of bronchitis may cause further lung problems.  CAUSES Acute bronchitis is most often caused by the same virus that causes a cold. The virus can spread from person to person (contagious) through coughing, sneezing, and touching contaminated objects. SIGNS AND SYMPTOMS   Cough.   Fever.   Coughing up mucus.   Body aches.   Chest congestion.   Chills.   Shortness of breath.   Sore throat.  DIAGNOSIS  Acute bronchitis is usually diagnosed through a physical exam. Your health care provider will also ask you questions about your medical history. Tests, such as chest X-rays, are sometimes done to rule out other conditions.  TREATMENT  Acute bronchitis usually goes away in a couple weeks. Oftentimes, no medical treatment is necessary. Medicines are sometimes given for relief of fever or cough. Antibiotic medicines are usually not needed but may be prescribed in certain situations. In some cases, an inhaler may be recommended to help reduce shortness of breath and control the cough. A cool mist vaporizer may also be used to help thin bronchial secretions and make it easier to clear the chest.  HOME CARE INSTRUCTIONS  Get plenty of rest.   Drink enough fluids to keep your urine clear or pale yellow (unless you have a medical condition  that requires fluid restriction). Increasing fluids may help thin your respiratory secretions (sputum) and reduce chest congestion, and it will prevent dehydration.   Take medicines only as directed by your health care provider.  If you were prescribed an antibiotic medicine, finish it all even if you start to feel better.  Avoid smoking and secondhand smoke. Exposure to cigarette smoke or irritating chemicals will make bronchitis worse. If you are a smoker, consider using nicotine gum or skin patches to help control withdrawal symptoms. Quitting smoking will help your lungs heal faster.   Reduce the chances of another bout of acute bronchitis by washing your hands frequently, avoiding people with cold symptoms, and trying not to touch your hands to your mouth, nose, or eyes.   Keep all follow-up visits as directed by your health care provider.  SEEK MEDICAL CARE IF: Your symptoms do not improve after 1 week of treatment.  SEEK IMMEDIATE MEDICAL CARE IF:  You develop an increased fever or chills.   You have chest pain.   You have severe shortness of breath.  You have bloody sputum.   You develop dehydration.  You faint or repeatedly feel like you are going to pass out.  You develop repeated vomiting.  You develop a severe headache. MAKE SURE YOU:   Understand these instructions.  Will watch your condition.  Will get help right away if you are not doing well or get worse.   This information is not intended to replace advice given to  you by your health care provider. Make sure you discuss any questions you have with your health care provider.   Document Released: 03/10/2004 Document Revised: 02/21/2014 Document Reviewed: 07/24/2012 Elsevier Interactive Patient Education [employer].

## 2015-06-06 ENCOUNTER — Other Ambulatory Visit: Payer: Self-pay | Admitting: Internal Medicine

## 2015-07-22 ENCOUNTER — Encounter (HOSPITAL_COMMUNITY): Payer: Self-pay | Admitting: *Deleted

## 2015-07-22 ENCOUNTER — Emergency Department (HOSPITAL_COMMUNITY)
Admission: EM | Admit: 2015-07-22 | Discharge: 2015-07-22 | Disposition: A | Discharge status: Home / Self Care | Payer: BC Managed Care – PPO | Attending: Emergency Medicine | Admitting: Emergency Medicine

## 2015-07-22 DIAGNOSIS — W269XXA Contact with unspecified sharp object(s), initial encounter: Secondary | ICD-10-CM | POA: Diagnosis not present

## 2015-07-22 DIAGNOSIS — S91311A Laceration without foreign body, right foot, initial encounter: Secondary | ICD-10-CM | POA: Insufficient documentation

## 2015-07-22 DIAGNOSIS — Z7952 Long term (current) use of systemic steroids: Secondary | ICD-10-CM | POA: Insufficient documentation

## 2015-07-22 DIAGNOSIS — Z791 Long term (current) use of non-steroidal anti-inflammatories (NSAID): Secondary | ICD-10-CM | POA: Insufficient documentation

## 2015-07-22 DIAGNOSIS — Y929 Unspecified place or not applicable: Secondary | ICD-10-CM | POA: Insufficient documentation

## 2015-07-22 DIAGNOSIS — Z79899 Other long term (current) drug therapy: Secondary | ICD-10-CM | POA: Insufficient documentation

## 2015-07-22 DIAGNOSIS — Y999 Unspecified external cause status: Secondary | ICD-10-CM | POA: Insufficient documentation

## 2015-07-22 DIAGNOSIS — Y939 Activity, unspecified: Secondary | ICD-10-CM | POA: Diagnosis not present

## 2015-07-22 MED ORDER — BACITRACIN ZINC 500 UNIT/GM EX OINT: TOPICAL_OINTMENT | Freq: Two times a day (BID) | CUTANEOUS | Status: DC

## 2015-07-22 NOTE — ED Provider Notes (Signed)
CSN: [id number]     Arrival date & time 07/22/15  1128 History  By signing my name below, I, Jackie Lewis, attest that this documentation has been prepared under the direction and in the presence of Debroah Baller, FNP  Electronically Signed: Reola Lewis, ED Scribe. 07/22/2015. 12:22 PM.     Chief Complaint  Patient presents with  . Laceration   Patient is a 60 y.o. female presenting with skin laceration. The history is provided by the patient. No language interpreter was used.  Laceration Location:  Foot Foot laceration location:  Sole of R foot Length (cm):  2.5cm Depth:  Cutaneous Bleeding: controlled   Time since incident:  4 hours Laceration mechanism:  Metal edge Pain details:    Quality:  Unable to specify   Severity:  Mild   Timing:  Intermittent   Progression:  Improving Foreign body present:  No foreign bodies Relieved by:  Pressure Worsened by:  Pressure and movement Ineffective treatments:  None tried Tetanus status:  Up to date  HPI Comments: Jackie Lewis is a 60 y.o. female who presents to the Emergency Department complaining of a R foot laceration that occurred earlier this morning approximately 4 hours ago. Bleeding is controlled. Pt reports that she was sitting in a recliner and she said she cut her R foot on something sharp on the sole of her R foot earlier this morning. Tetanus is UTD.   Past Medical History  Diagnosis Date  . Headache(784.0)   . Recurrent UTI   . Scleritis     hx  . Abnormal thyroid blood test   . Allergic rhinitis   . CTS (carpal tunnel syndrome)    Past Surgical History  Procedure Laterality Date  . Esophagogastroduodenoscopy  3/09   Family History  Problem Relation Age of Onset  . Stroke Father   . Parkinsonism Father   . Other Son     traumatic brain injury 2011  . Thyroid disease    . Diabetes type II Father   . Hypertension    . Hyperlipidemia     Social History  Substance Use Topics  . Smoking status:  Never Smoker   . Smokeless tobacco: None  . Alcohol Use: No   OB History    Gravida Para Term Preterm AB TAB SAB Ectopic Multiple Living   6 3             Review of Systems  Constitutional: Negative for fever.  Skin: Positive for wound.  all other systems negative  Allergies  Nitrofurantoin monohyd macro and Epinephrine  Home Medications   Prior to Admission medications   Medication Sig Start Date End Date Taking? Authorizing Provider  Cholecalciferol (VITAMIN D-3 PO) Take 1 tablet by mouth daily.    Historical Provider, MD  CRANBERRY PO Take 1 capsule by mouth every other day. Reported on 05/01/2015    Historical Provider, MD  HYDROcodone-homatropine (HYCODAN) 5-1.5 MG/5ML syrup Take 5 mLs by mouth every 8 (eight) hours as needed for cough. 05/01/15   Dorothyann Peng, NP  ibuprofen (ADVIL,MOTRIN) 400 MG tablet Take by mouth. USING PRN FOR KNEE PAIN    Historical Provider, MD  levothyroxine (SYNTHROID, LEVOTHROID) 88 MCG tablet TAKE 1 TABLET BY MOUTH ONCE DAILY BEFORE BREAKFAST 02/24/15   Burnis Medin, MD  predniSONE (DELTASONE) 10 MG tablet Take 1 tablet (10 mg total) by mouth daily with breakfast. 40 mg x 3 days, 20 mg x 3 days, 10 mg x 3 days  05/01/15   Dorothyann Peng, NP  triamcinolone (KENALOG) 0.025 % cream Apply 1 application topically 2 (two) times daily as needed (itching).    Historical Provider, MD  triamcinolone (KENALOG) 0.025 % cream APPLY TO AFFECTED AREA TWICE DAILY AS DIRECTED 06/10/15   Burnis Medin, MD   BP 150/71 mmHg  Pulse 79  Temp(Src) 98 F (36.7 C) (Oral)  Resp 16  Wt 87.686 kg  SpO2 94%   Physical Exam  Constitutional: She appears well-developed and well-nourished.  HENT:  Head: Normocephalic.  Eyes: Conjunctivae and EOM are normal.  Neck: Neck supple.  Cardiovascular: Normal rate.   DP pulse is 2+, adequate circulation.  Pulmonary/Chest: Effort normal. No respiratory distress.  Abdominal: She exhibits no distension.  Musculoskeletal: Normal range  of motion.  Neurological: She is alert.  Skin: Skin is warm and dry.  2.5cm flap laceration. Flap is very thin. Area is not suturable. No erythema or signs of infection.  Psychiatric: She has a normal mood and affect. Her behavior is normal.  Nursing note and vitals reviewed.  ED Course  Procedures (including critical care time) Foot soaked in NSS with antibacterial soap. Wound cleaned Bacitracin ointment and dressing applied Patient is UTD on tetanus. DIAGNOSTIC STUDIES:    COORDINATION OF CARE: 12:18 PM-Discussed next steps with pt including soaking and dressing the wound. Pt verbalized understanding and is agreeable with the plan.   MDM   Final diagnoses:  Laceration of right foot, initial encounter  Pressure irrigation performed. Laceration occurred < 8 hours ago. Pt has no co morbidities to effect normal wound healing. Discussed home care w pt and answered questions. Pt to f-u for wound check if any problems arise. Pt is hemodynamically stable w no complaints prior to dc.    I personally performed the services described in this documentation, which was scribed in my presence. The recorded information has been reviewed and is accurate.   [place], [place] 07/22/15 1404  Veryl Speak, MD 07/23/15 1540

## 2015-07-22 NOTE — ED Notes (Signed)
Declined W/C at D/C and was escorted to lobby by RN. 

## 2015-07-22 NOTE — ED Notes (Signed)
After rt foot was soaked pt wound dressed with bactrim, 4x4 and tape.

## 2015-11-18 ENCOUNTER — Other Ambulatory Visit (INDEPENDENT_AMBULATORY_CARE_PROVIDER_SITE_OTHER): Payer: BC Managed Care – PPO

## 2015-11-18 DIAGNOSIS — Z0000 Encounter for general adult medical examination without abnormal findings: Secondary | ICD-10-CM | POA: Diagnosis not present

## 2015-11-18 LAB — CBC WITH DIFFERENTIAL/PLATELET
BASOS ABS: 0 10*3/uL (ref 0.0–0.1)
Basophils Relative: 0.5 % (ref 0.0–3.0)
EOS ABS: 0.3 10*3/uL (ref 0.0–0.7)
Eosinophils Relative: 5.2 % — ABNORMAL HIGH (ref 0.0–5.0)
HCT: 36.9 % (ref 36.0–46.0)
Hemoglobin: 12.4 g/dL (ref 12.0–15.0)
LYMPHS ABS: 2 10*3/uL (ref 0.7–4.0)
LYMPHS PCT: 33 % (ref 12.0–46.0)
MCHC: 33.6 g/dL (ref 30.0–36.0)
MCV: 83.2 fl (ref 78.0–100.0)
Monocytes Absolute: 0.4 10*3/uL (ref 0.1–1.0)
Monocytes Relative: 6.1 % (ref 3.0–12.0)
NEUTROS ABS: 3.3 10*3/uL (ref 1.4–7.7)
NEUTROS PCT: 55.2 % (ref 43.0–77.0)
PLATELETS: 211 10*3/uL (ref 150.0–400.0)
RBC: 4.44 Mil/uL (ref 3.87–5.11)
RDW: 14.4 % (ref 11.5–15.5)
WBC: 6.1 10*3/uL (ref 4.0–10.5)

## 2015-11-18 LAB — HEPATIC FUNCTION PANEL
ALK PHOS: 61 U/L (ref 39–117)
ALT: 21 U/L (ref 0–35)
AST: 17 U/L (ref 0–37)
Albumin: 4 g/dL (ref 3.5–5.2)
BILIRUBIN DIRECT: 0 mg/dL (ref 0.0–0.3)
Total Bilirubin: 0.5 mg/dL (ref 0.2–1.2)
Total Protein: 7.5 g/dL (ref 6.0–8.3)

## 2015-11-18 LAB — BASIC METABOLIC PANEL
BUN: 20 mg/dL (ref 6–23)
CALCIUM: 9.2 mg/dL (ref 8.4–10.5)
CO2: 28 meq/L (ref 19–32)
CREATININE: 0.78 mg/dL (ref 0.40–1.20)
Chloride: 103 mEq/L (ref 96–112)
GFR: 79.65 mL/min (ref >60.00)
GLUCOSE: 90 mg/dL (ref 70–99)
Potassium: 4 mEq/L (ref 3.5–5.1)
Sodium: 140 mEq/L (ref 135–145)

## 2015-11-18 LAB — LIPID PANEL
CHOL/HDL RATIO: 3
Cholesterol: 192 mg/dL (ref 0–200)
HDL: 68.2 mg/dL (ref >39.00)
LDL CALC: 103 mg/dL — AB (ref 0–99)
NONHDL: 123.33
Triglycerides: 103 mg/dL (ref 0.0–149.0)
VLDL: 20.6 mg/dL (ref 0.0–40.0)

## 2015-11-18 LAB — TSH: TSH: 4.49 u[IU]/mL (ref 0.35–4.50)

## 2015-11-21 ENCOUNTER — Other Ambulatory Visit: Payer: Self-pay | Admitting: Internal Medicine

## 2015-11-24 NOTE — Progress Notes (Signed)
Pre visit review using our clinic review tool, if applicable. No additional management support is needed unless otherwise documented below in the visit note.  Chief Complaint  Patient presents with  . Annual Exam    HPI: Patient  Jackie Lewis  60 y.o. comes in today for [place] visit  She is taking her thyroid medicine almost all days rarely forgets. No major change in her health although her daughter had a serious illness and is in remission on specialty medicine. 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 Her insurance so she is due for her colonoscopy.  Health Maintenance  Topic Date Due  . ZOSTAVAX  04/17/2014  . PAP SMEAR  07/01/2014  . COLONOSCOPY  05/03/2015  . HIV Screening  11/23/2016 (Originally 04/16/1970)  . MAMMOGRAM  11/03/2016  . TETANUS/TDAP  06/14/2025  . INFLUENZA VACCINE  Completed  . Hepatitis C Screening  Completed   Health Maintenance Review LIFESTYLE:  Exercise:  Walk and gym at time  Treadmill  Active  Tobacco/ETS: no Alcohol:  no Sugar beverages: no ocass OJ  Sleep:    interrupted sleep  About 12 -  5-6   .     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 GI /GU: No adominal pain, vomiting, change in bowel habits. No blood in the stool. No significant GU symptoms. SKIN/HEME: ,no acute skin rashes suspicious lesions or bleeding. No lymphadenopathy, nodules, masses.  NEURO/ PSYCH:  No neurologic signs such as weakness numbness. No depression anxiety. IMM/ Allergy: No unusual infections.  Allergy .   REST of 12 system review negative except as per HPI   Past Medical History:  Diagnosis Date  . Abnormal thyroid blood test   . Allergic rhinitis   . CTS (carpal tunnel syndrome)   . Headache(784.0)   .  Recurrent UTI   . Scleritis    hx    Past Surgical History:  Procedure Laterality Date  . ESOPHAGOGASTRODUODENOSCOPY  3/09    Family History  Problem Relation Age of Onset  . Stroke Father   . Parkinsonism Father   . Other Son     traumatic brain injury 2011  . Thyroid disease    . Diabetes type II Father   . Hypertension    . Hyperlipidemia      Social History   Social History  . Marital status: Married    Spouse name: N/A  . Number of children: N/A  . Years of education: N/A   Social History Main Topics  . Smoking status: Never Smoker  . Smokeless tobacco: Never Used  . Alcohol use No  . Drug use: No  . Sexual activity: Not Asked   Other Topics Concern  . 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    [place] country of origin   Bereaved parent   Son  speaking some  Walks feeds sabnd dressed self  Can go to gym for som exercise      Hh  of  3 . Daughter graduated from college [employer]  grad school in [facility]    Daughter now teaching at [employer].   [phone]    Outpatient Medications Prior to Visit  Medication Sig Dispense Refill  . Cholecalciferol (VITAMIN D-3 PO) Take 1 tablet by mouth daily.    Marland Kitchen ibuprofen (ADVIL,MOTRIN) 400 MG tablet Take by mouth. USING PRN FOR KNEE PAIN    . levothyroxine (SYNTHROID, LEVOTHROID) 88 MCG tablet TAKE 1 TABLET BY MOUTH ONCE DAILY BEFORE BREAKFAST 90 tablet 2  . triamcinolone (KENALOG) 0.025 % cream Apply 1 application topically 2 (two) times daily as needed (itching).    . triamcinolone (KENALOG) 0.025 % cream APPLY TO AFFECTED AREA TWICE DAILY AS DIRECTED 80 g 0  . CRANBERRY PO Take 1 capsule by mouth every other day. Reported on 05/01/2015    . HYDROcodone-homatropine (HYCODAN) 5-1.5 MG/5ML syrup Take 5 mLs by mouth every 8 (eight) hours as needed for  cough. 120 mL 0  . predniSONE (DELTASONE) 10 MG tablet Take 1 tablet (10 mg total) by mouth daily with breakfast. 40 mg x 3 days, 20 mg x 3 days, 10 mg x 3 days 21 tablet 0   No facility-administered medications prior to visit.      EXAM:  BP 136/78 (BP Location: Left Arm, Patient Position: Sitting, Cuff Size: Large)   Temp 98.2 F (36.8 C) (Oral)   Ht 5\' 1"  (1.549 m)   Wt 196 lb 3.2 oz (89 kg)   BMI 37.07 kg/m   Body mass index is 37.07 kg/m.  Physical Exam: Vital signs reviewed [id number] is a well-developed well-nourished alert cooperative    who appearsr stated age in no acute distress.  HEENT: normocephalic atraumatic , Eyes: PERRL EOM's full, conjunctiva clear, Nares: paten,t no deformity discharge or tenderness., Ears: no deformity EAC's clear TMs with normal landmarks. Mouth: clear OP, no lesions, edema.  Moist mucous membranes. Dentition in adequate repair. NECK: supple without masses, thyromegaly or bruits. CHEST/PULM:  Clear to auscultation and percussion breath sounds equal no wheeze , rales or rhonchi. No chest wall deformities or tenderness. CV: PMI is nondisplaced, S1 S2 no gallops, murmurs, rubs. Peripheral pulses are full without delay.No JVD . Breast: normal by inspection . No dimpling, discharge, masses, tenderness or discharge . ABDOMEN: Bowel sounds normal nontender  No guard or rebound, no hepato splenomegal no CVA tenderness.  No hernia. Extremtities:  No clubbing cyanosis or edema, no acute joint swelling or redness no focal atrophy NEURO:  Oriented x3, cranial nerves 3-12 appear to be intact, no obvious focal weakness,gait within normal limits no abnormal reflexes or asymmetrical SKIN: No acute rashes normal turgor, color, no bruising or petechiae. PSYCH: Oriented, good eye contact, no obvious depression anxiety, cognition and judgment appear normal. LN: no cervical axillary inguinal adenopathy  Lab Results  Component Value Date   WBC 6.1 11/18/2015   HGB  12.4 11/18/2015   HCT 36.9 11/18/2015   PLT 211.0 11/18/2015   GLUCOSE 90 11/18/2015   CHOL 192 11/18/2015   TRIG 103.0 11/18/2015   HDL 68.20 11/18/2015   LDLDIRECT 113.9 06/22/2010   LDLCALC 103 (H) 11/18/2015   ALT 21 11/18/2015   AST 17 11/18/2015   NA 140 11/18/2015   K 4.0 11/18/2015   CL 103 11/18/2015   CREATININE 0.78 11/18/2015   BUN 20 11/18/2015   CO2 28 11/18/2015   TSH 4.49 11/18/2015   HGBA1C 5.4 07/17/2008   MICROALBUR 0.7 04/14/2006   Wt Readings from  Last 3 Encounters:  11/25/15 196 lb 3.2 oz (89 kg)  07/22/15 193 lb 5 oz (87.7 kg)  05/01/15 202 lb (91.6 kg)    ASSESSMENT AND PLAN:  Discussed the following assessment and plan:  Visit for preventive health examination  Medication management  Need for prophylactic vaccination and inoculation against influenza - Plan: Flu Vaccine QUAD 36+ mos PF IM (Fluarix & Fluzone Quad PF)  Colon cancer screening - Plan: Ambulatory referral to Gastroenterology  Hypothyroidism, unspecified type - TSH in range although upper limits of normal recheck in 3 months to ensure stability same dose at this time.  Patient Care Team: Burnis Medin, MD as PCP - General ent ortho  Patient Instructions    Your are due for a pap smear 2018 we can do this at next year check up.  You last colonoscopy was done by Dr Sharlett Iles who has retired so I will send a referral for  New GI. Colonoscopy.  Recheck tsh in    About 3 months to make sure still in range   Same dose medication in the interim .   cpx with labs  in a year  Healthy lifestyle includes : At least 150 minutes of exercise weeks  , weight at healthy levels, which is usually   BMI 19-25. Avoid trans fats and processed foods;  Increase fresh fruits and veges to 5 servings per day. And avoid sweet beverages including tea and juice. Mediterranean diet with olive oil and nuts have been noted to be heart and brain healthy . Avoid tobacco products . Limit  alcohol to  7  per week for women and 14 servings for men.  Get adequate sleep . Wear seat belts . Don't text and drive .      Standley Brooking. Audryna Wendt M.D.

## 2015-11-25 ENCOUNTER — Ambulatory Visit (INDEPENDENT_AMBULATORY_CARE_PROVIDER_SITE_OTHER): Payer: BC Managed Care – PPO | Admitting: Internal Medicine

## 2015-11-25 ENCOUNTER — Encounter: Payer: Self-pay | Admitting: Internal Medicine

## 2015-11-25 VITALS — BP 136/78 | Temp 98.2°F | Ht 61.0 in | Wt 196.2 lb

## 2015-11-25 DIAGNOSIS — Z1211 Encounter for screening for malignant neoplasm of colon: Secondary | ICD-10-CM | POA: Diagnosis not present

## 2015-11-25 DIAGNOSIS — Z79899 Other long term (current) drug therapy: Secondary | ICD-10-CM

## 2015-11-25 DIAGNOSIS — E039 Hypothyroidism, unspecified: Secondary | ICD-10-CM

## 2015-11-25 DIAGNOSIS — Z23 Encounter for immunization: Secondary | ICD-10-CM | POA: Diagnosis not present

## 2015-11-25 DIAGNOSIS — Z0000 Encounter for general adult medical examination without abnormal findings: Secondary | ICD-10-CM

## 2015-11-25 NOTE — Telephone Encounter (Signed)
Sent to the pharmacy by e-scribe for 1 year.  Pt has upcoming cpx on 11/29/16.

## 2015-11-25 NOTE — Patient Instructions (Addendum)
   Your are due for a pap smear 2018 we can do this at next year check up.  You last colonoscopy was done by Dr Sharlett Iles who has retired so I will send a referral for  New GI. Colonoscopy.  Recheck tsh in    About 3 months to make sure still in range   Same dose medication in the interim .   cpx with labs  in a year  Healthy lifestyle includes : At least 150 minutes of exercise weeks  , weight at healthy levels, which is usually   BMI 19-25. Avoid trans fats and processed foods;  Increase fresh fruits and veges to 5 servings per day. And avoid sweet beverages including tea and juice. Mediterranean diet with olive oil and nuts have been noted to be heart and brain healthy . Avoid tobacco products . Limit  alcohol to  7 per week for women and 14 servings for men.  Get adequate sleep . Wear seat belts . Don't text and drive .

## 2015-11-30 ENCOUNTER — Encounter: Payer: Self-pay | Admitting: Internal Medicine

## 2016-01-19 ENCOUNTER — Ambulatory Visit (AMBULATORY_SURGERY_CENTER): Payer: Self-pay

## 2016-01-19 VITALS — Ht 62.0 in | Wt 201.2 lb

## 2016-01-19 DIAGNOSIS — Z800 Family history of malignant neoplasm of digestive organs: Secondary | ICD-10-CM

## 2016-01-19 MED ORDER — NA SULFATE-K SULFATE-MG SULF 17.5-3.13-1.6 GM/177ML PO SOLN: ORAL | 0 refills | Status: DC

## 2016-01-19 NOTE — Progress Notes (Signed)
Per pt, no allergies to soy or egg products.Pt not taking any weight loss meds or using  O2 at home. 

## 2016-01-20 ENCOUNTER — Encounter: Payer: Self-pay | Admitting: Internal Medicine

## 2016-02-02 ENCOUNTER — Encounter: Payer: Self-pay | Admitting: Internal Medicine

## 2016-02-02 ENCOUNTER — Ambulatory Visit (AMBULATORY_SURGERY_CENTER): Payer: BC Managed Care – PPO | Admitting: Internal Medicine

## 2016-02-02 VITALS — BP 98/55 | HR 62 | Temp 96.2°F | Resp 14 | Ht 62.0 in | Wt 201.0 lb

## 2016-02-02 DIAGNOSIS — Z1211 Encounter for screening for malignant neoplasm of colon: Secondary | ICD-10-CM | POA: Diagnosis not present

## 2016-02-02 DIAGNOSIS — K620 Anal polyp: Secondary | ICD-10-CM

## 2016-02-02 DIAGNOSIS — D125 Benign neoplasm of sigmoid colon: Secondary | ICD-10-CM

## 2016-02-02 DIAGNOSIS — Z1212 Encounter for screening for malignant neoplasm of rectum: Secondary | ICD-10-CM

## 2016-02-02 DIAGNOSIS — D122 Benign neoplasm of ascending colon: Secondary | ICD-10-CM

## 2016-02-02 DIAGNOSIS — K635 Polyp of colon: Secondary | ICD-10-CM

## 2016-02-02 MED ORDER — SODIUM CHLORIDE 0.9 % IV SOLN: 500.0000 mL | INTRAVENOUS | Status: DC

## 2016-02-02 NOTE — Patient Instructions (Signed)
YOU HAD AN ENDOSCOPIC PROCEDURE TODAY AT [facility]:   Refer to the procedure report that was given to you for any specific questions about what was found during the examination.  If the procedure report does not answer your questions, please call your gastroenterologist to clarify.  If you requested that your care partner not be given the details of your procedure findings, then the procedure report has been included in a sealed envelope for you to review at your convenience later.  YOU SHOULD EXPECT: Some feelings of bloating in the abdomen. Passage of more gas than usual.  Walking can help get rid of the air that was put into your GI tract during the procedure and reduce the bloating. If you had a lower endoscopy (such as a colonoscopy or flexible sigmoidoscopy) you may notice spotting of blood in your stool or on the toilet paper. If you underwent a bowel prep for your procedure, you may not have a normal bowel movement for a few days.  Please Note:  You might notice some irritation and congestion in your nose or some drainage.  This is from the oxygen used during your procedure.  There is no need for concern and it should clear up in a day or so.  SYMPTOMS TO REPORT IMMEDIATELY:   Following lower endoscopy (colonoscopy or flexible sigmoidoscopy):  Excessive amounts of blood in the stool  Significant tenderness or worsening of abdominal pains  Swelling of the abdomen that is new, acute  Fever of 100F or higher  For urgent or emergent issues, a gastroenterologist can be reached at any hour by calling [phone].   DIET:  We do recommend a small meal at first, but then you may proceed to your regular diet.  Drink plenty of fluids but you should avoid alcoholic beverages for 24 hours.  ACTIVITY:  You should plan to take it easy for the rest of today and you should NOT DRIVE or use heavy machinery until tomorrow (because of the sedation medicines used during the test).     FOLLOW UP: Our staff will call the number listed on your records the next business day following your procedure to check on you and address any questions or concerns that you may have regarding the information given to you following your procedure. If we do not reach you, we will leave a message.  However, if you are feeling well and you are not experiencing any problems, there is no need to return our call.  We will assume that you have returned to your regular daily activities without incident.  If any biopsies were taken you will be contacted by phone or by letter within the next 1-3 weeks.  Please call us at [phone] if you have not heard about the biopsies in 3 weeks.    SIGNATURES/CONFIDENTIALITY: You and/or your care partner have signed paperwork which will be entered into your electronic medical record.  These signatures attest to the fact that that the information above on your After Visit Summary has been reviewed and is understood.  Full responsibility of the confidentiality of this discharge information lies with you and/or your care-partner.  Polyps (handout given) Await biopsy results Diverticulosis (handout given)

## 2016-02-02 NOTE — Progress Notes (Signed)
Report to PACU, RN, vss, BBS= Clear.  

## 2016-02-02 NOTE — Progress Notes (Signed)
Called to room to assist during endoscopic procedure.  Patient ID and intended procedure confirmed with present staff. Received instructions for my participation in the procedure from the performing physician.  

## 2016-02-02 NOTE — Op Note (Signed)
[place] Patient Name: Jackie Lewis Procedure Date: 02/02/2016 9:20 AM MRN: [id number] Endoscopist: Jerene Bears , MD Age: 60 Referring MD:  Date of Birth: 10/24/1955 Gender: Female Account #: [id number] Procedure:                Colonoscopy Indications:              Screening for colorectal malignant neoplasm, Last                            colonoscopy 10 years ago Medicines:                Monitored Anesthesia Care Procedure:                Pre-Anesthesia Assessment:                           - Prior to the procedure, a History and Physical                            was performed, and patient medications and                            allergies were reviewed. The patient's tolerance of                            previous anesthesia was also reviewed. The risks                            and benefits of the procedure and the sedation                            options and risks were discussed with the patient.                            All questions were answered, and informed consent                            was obtained. Prior Anticoagulants: The patient has                            taken no previous anticoagulant or antiplatelet                            agents. ASA Grade Assessment: II - A patient with                            mild systemic disease. After reviewing the risks                            and benefits, the patient was deemed in                            satisfactory condition to undergo the procedure.  After obtaining informed consent, the colonoscope                            was passed under direct vision. Throughout the                            procedure, the patient's blood pressure, pulse, and                            oxygen saturations were monitored continuously. The                            Model CF-HQ190L [id number]) scope was introduced                            through the anus and advanced to the  the cecum,                            identified by appendiceal orifice and ileocecal                            valve. The colonoscopy was performed without                            difficulty. The patient tolerated the procedure                            well. The quality of the bowel preparation was                            good. The ileocecal valve, appendiceal orifice, and                            rectum were photographed. Scope In: 9:22:48 AM Scope Out: 9:40:46 AM Scope Withdrawal Time: 0 hours 13 minutes 12 seconds  Total Procedure Duration: 0 hours 17 minutes 58 seconds  Findings:                 The perianal exam findings include skin tags and                            palpable polypoid lesion.                           A 7 mm polyp was found in the ascending colon. The                            polyp was sessile. The polyp was removed with a                            cold snare. Resection and retrieval were complete.                           A 4 mm polyp was found in the sigmoid  colon. The                            polyp was sessile. The polyp was removed with a                            cold snare. Resection and retrieval were complete.                           A 4 mm polyp was found in the anal canal. The polyp                            was semi-pedunculated. Biopsy was taken with a cold                            forceps for histology to exclude anal neoplasia                            (versus skin tag).                           Scattered small-mouthed diverticula were found in                            the sigmoid colon, descending colon, hepatic                            flexure, ascending colon and cecum.                           No additional abnormalities were found on                            retroflexion. Complications:            No immediate complications. Estimated Blood Loss:     Estimated blood loss was minimal. Impression:               -  Perianal skin tags found on perianal exam.                           - One 7 mm polyp in the ascending colon, removed                            with a cold snare. Resected and retrieved.                           - One 4 mm polyp in the sigmoid colon, removed with                            a cold snare. Resected and retrieved.                           - One 4 mm polyp in the anal canal. Biopsied.                           -  Mild diverticulosis. Recommendation:           - Patient has a contact number available for                            emergencies. The signs and symptoms of potential                            delayed complications were discussed with the                            patient. Return to normal activities tomorrow.                            Written discharge instructions were provided to the                            patient.                           - Resume previous diet.                           - Continue present medications.                           - Await pathology results.                           - Repeat colonoscopy is recommended. The                            colonoscopy date will be determined after pathology                            results from today's exam become available for                            review. Jerene Bears, MD 02/02/2016 9:46:16 AM This report has been signed electronically.

## 2016-02-03 ENCOUNTER — Telehealth: Payer: Self-pay

## 2016-02-03 NOTE — Telephone Encounter (Signed)
Number identifier. Left voicemail will call back later today.

## 2016-02-05 ENCOUNTER — Encounter: Payer: Self-pay | Admitting: Internal Medicine

## 2016-02-10 ENCOUNTER — Other Ambulatory Visit: Payer: Self-pay | Admitting: Internal Medicine

## 2016-02-10 DIAGNOSIS — Z1231 Encounter for screening mammogram for malignant neoplasm of breast: Secondary | ICD-10-CM

## 2016-02-17 ENCOUNTER — Ambulatory Visit: Payer: BC Managed Care – PPO

## 2016-02-25 ENCOUNTER — Other Ambulatory Visit: Payer: BC Managed Care – PPO

## 2016-03-01 ENCOUNTER — Other Ambulatory Visit (INDEPENDENT_AMBULATORY_CARE_PROVIDER_SITE_OTHER): Payer: BC Managed Care – PPO

## 2016-03-01 DIAGNOSIS — E039 Hypothyroidism, unspecified: Secondary | ICD-10-CM | POA: Diagnosis not present

## 2016-03-01 LAB — TSH: TSH: 4.21 u[IU]/mL (ref 0.35–4.50)

## 2016-03-08 ENCOUNTER — Ambulatory Visit: Payer: BC Managed Care – PPO

## 2016-05-03 ENCOUNTER — Ambulatory Visit
Admission: RE | Admit: 2016-05-03 | Discharge: 2016-05-03 | Disposition: A | Discharge status: Home / Self Care | Payer: BC Managed Care – PPO | Source: Ambulatory Visit | Attending: Internal Medicine | Admitting: Internal Medicine

## 2016-05-03 DIAGNOSIS — Z1231 Encounter for screening mammogram for malignant neoplasm of breast: Secondary | ICD-10-CM

## 2016-05-05 ENCOUNTER — Other Ambulatory Visit: Payer: Self-pay | Admitting: Internal Medicine

## 2016-05-05 DIAGNOSIS — R928 Other abnormal and inconclusive findings on diagnostic imaging of breast: Secondary | ICD-10-CM

## 2016-05-09 ENCOUNTER — Ambulatory Visit
Admission: RE | Admit: 2016-05-09 | Discharge: 2016-05-09 | Disposition: A | Discharge status: Home / Self Care | Payer: BC Managed Care – PPO | Source: Ambulatory Visit | Attending: Internal Medicine | Admitting: Internal Medicine

## 2016-05-09 DIAGNOSIS — R928 Other abnormal and inconclusive findings on diagnostic imaging of breast: Secondary | ICD-10-CM

## 2016-09-27 ENCOUNTER — Other Ambulatory Visit: Payer: Self-pay | Admitting: Internal Medicine

## 2016-11-22 ENCOUNTER — Other Ambulatory Visit: Payer: BC Managed Care – PPO

## 2016-11-27 NOTE — Progress Notes (Signed)
Chief Complaint  Patient presents with  . Annual Exam    pap today.    HPI: Patient  Jackie Lewis  62 y.o. comes in today for Preventive Health Care visit gyne exam  No new medical history. She does see [facility] for her ears and is on thyroid medication taking daily rarely misses. 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 Tends to sleep late at night hard to get comfortable. No recent UTIs bleeding is up-to-date on colonoscopy on a 5 year recall because of family history and may have had some polyps Also mammogram.  Dense breast on right  Hasn't had an eye check in a while does have a history of episcleritis and does think she needs sunglasses.  Health Maintenance  Topic Date Due  . HIV Screening  04/16/1969  . PAP SMEAR  07/01/2014  . INFLUENZA VACCINE  09/14/2016  . MAMMOGRAM  05/04/2018  . COLONOSCOPY  02/01/2021  . TETANUS/TDAP  06/14/2025  . 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   Hx of epicleritis in pat no recnet eye check  Is tired a lot  GEN/ HEENT: No fever, significant weight changes  Some sweats headaches CV/ PULM; No chest pain shortness of breath cough, syncope,edema  change in exercise tolerance. GI /GU: No adominal pain, vomiting, change in bowel habits. No blood in the stool. No significant GU symptoms. SKIN/HEME: ,no acute skin rashes suspicious lesions or bleeding. No lymphadenopathy, nodules, masses.  NEURO/ PSYCH:  No neurologic signs such as weakness numbness.  No depression anxiety. IMM/ Allergy: No unusual infections.  Allergy .   REST of 12 system review negative except as per HPI   Past Medical History:  Diagnosis Date  . Abnormal thyroid blood test   . Allergic rhinitis   . Allergy   . Anxiety   . Carpal tunnel    Bil  . GERD (gastroesophageal reflux disease)   . Headache(784.0)   . Hearing loss    Bil/ worse on left side.  . Recurrent UTI   . Scleritis    hx  . Thyroid disease     Past Surgical History:  Procedure Laterality Date  . ESOPHAGOGASTRODUODENOSCOPY  3/09  . EXCISION RADIAL HEAD  2015   right elbow  . INNER EAR SURGERY     hole in right ear/ surg left ear 2-3 times  . UPPER GASTROINTESTINAL ENDOSCOPY      Family History  Problem Relation Age of Onset  . Stroke Father   . Parkinsonism Father   . Other Son        traumatic brain injury 2011  . Thyroid disease Unknown   . Hypertension Unknown   . Hyperlipidemia Unknown   . Uterine cancer Sister   . Colon cancer Paternal Aunt   . Heart disease Sister   . Hemolytic uremic syndrome Daughter  atypical  under rx     Social History   Social History  . Marital status: Married    Spouse name: N/A  . Number of children: N/A  . Years of education: N/A   Social History Main Topics  . Smoking status: Never Smoker  . Smokeless tobacco: Never Used  . Alcohol use No  . Drug use: No  . Sexual activity: Not Asked   Other Topics Concern  . 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    [place] country of origin   Bereaved parent   Son  speaking some  Walks feeds sabnd dressed self  Can go to gym for som exercise      Hh  of 2 . Daughter graduated from [employer]  grad school in [place] living in [place]    Daughter   In [place]    Outpatient  Medications Prior to Visit  Medication Sig Dispense Refill  . calcium carbonate (OS-CAL) 1250 (500 Ca) MG chewable tablet Chew 2 tablets by mouth daily.    Marland Kitchen ibuprofen (ADVIL,MOTRIN) 400 MG tablet Take by mouth. USING PRN FOR KNEE PAIN    . levothyroxine (SYNTHROID, LEVOTHROID) 88 MCG tablet TAKE 1 TABLET BY MOUTH ONCE DAILY BEFORE BREAKFAST 90 tablet 1  . triamcinolone (KENALOG) 0.025 % cream Apply 1 application topically 2 (two) times daily as needed (itching).    . Cholecalciferol (VITAMIN D-3 PO) Take 1 tablet by mouth daily.    Marland Kitchen triamcinolone (KENALOG) 0.025 % cream APPLY TO AFFECTED AREA TWICE DAILY AS DIRECTED (Patient not taking: Reported on 11/29/2016) 80 g 0   Facility-Administered Medications Prior to Visit  Medication Dose Route Frequency Provider Last Rate Last Dose  . 0.9 %  sodium chloride infusion  500 mL Intravenous Continuous Pyrtle, Lajuan Lines, MD         EXAM:  BP 132/76 (BP Location: Left Arm, Patient Position: Sitting, Cuff Size: Large)   Pulse 84   Temp 98.3 F (36.8 C) (Oral)   Ht 5' 0.75" (1.543 m)   Wt 199 lb 1.6 oz (90.3 kg)   BMI 37.93 kg/m   Body mass index is 37.93 kg/m. Wt Readings from Last 3 Encounters:  11/29/16 199 lb 1.6 oz (90.3 kg)  02/02/16 201 lb (91.2 kg)  01/19/16 201 lb 3.2 oz (91.3 kg)    Physical Exam: Vital signs reviewed ZWC:HENI is a well-developed well-nourished alert cooperative    who appearsr stated age in no acute distress.  HEENT: normocephalic atraumatic , Eyes: PERRL EOM's full, conjunctiva clear, Nares: paten,t no deformity discharge or tenderness., Ears: no deformity EAC's clear TMs with normal landmarks. Mouth: clear OP, no lesions, edema.  Moist mucous membranes. Dentition in adequate repair. NECK: supple without masses, thyromegaly or bruits. CHEST/PULM:  Clear to auscultation and percussion breath sounds equal no wheeze , rales or rhonchi. No chest wall deformities or tenderness. Breast: normal by inspection . No  dimpling, discharge, masses, tenderness or discharge . CV: PMI is nondisplaced, S1 S2 no gallops, murmurs, rubs. Peripheral pulses are full without delay.No JVD .  ABDOMEN: Bowel sounds normal nontender  No guard or rebound, no hepato splenomegal no CVA tenderness.  No hernia.  Extremtities:  No clubbing cyanosis or edema, no acute joint swelling or redness no focal atrophy NEURO:  Oriented x3, cranial nerves 3-12 appear to be intact, no obvious focal weakness,gait within normal limits no abnormal reflexes or asymmetrical SKIN: No acute rashes normal turgor, color, no bruising or petechiae. Some frekling lower lip no lesion  No bleeding  PSYCH: Oriented, good eye contact, no obvious depression anxiety, cognition and judgment appear normal. LN: no cervical axillary inguinal adenopathy Pelvic: NL ext GU, labia clear without lesions or rash . Urethra nl   Vagina no lesions .Cervix: clear  Os 1 + ectopy  UTERUS: Neg CMT Adnexa:  clear no masses . PAP done hih risk hpv   Lab Results  Component Value Date   WBC 6.1 11/18/2015   HGB 12.4 11/18/2015   HCT 36.9 11/18/2015   PLT 211.0 11/18/2015   GLUCOSE 90 11/18/2015   CHOL 192 11/18/2015   TRIG 103.0 11/18/2015   HDL 68.20 11/18/2015   LDLDIRECT 113.9 06/22/2010   LDLCALC 103 (H) 11/18/2015   ALT 21 11/18/2015   AST 17 11/18/2015   NA 140 11/18/2015   K 4.0 11/18/2015   CL 103 11/18/2015   CREATININE 0.78 11/18/2015   BUN 20 11/18/2015   CO2 28 11/18/2015   TSH 4.21 03/01/2016   HGBA1C 5.4 07/17/2008   MICROALBUR 0.7 04/14/2006    BP Readings from Last 3 Encounters:  11/29/16 132/76  02/02/16 (!) 98/55  11/25/15 136/78   Wt Readings from Last 3 Encounters:  11/29/16 199 lb 1.6 oz (90.3 kg)  02/02/16 201 lb (91.2 kg)  01/19/16 201 lb 3.2 oz (91.3 kg)    Lab results reviewed with patient   ASSESSMENT AND PLAN:  Discussed the following assessment and plan:  Visit for preventive health examination - Plan: Basic metabolic  panel, CBC with Differential/Platelet, Hepatic function panel, Lipid panel, Cytology - PAP  History of hyperglycemia - Plan: Basic metabolic panel, CBC with Differential/Platelet, Hepatic function panel, Lipid panel, TSH  Hypothyroidism, unspecified type - Plan: Basic metabolic panel, CBC with Differential/Platelet, Hepatic function panel, Lipid panel, TSH  Need for influenza vaccination - Plan: Flu Vaccine QUAD 6+ mos PF IM (Fluarix Quad PF)  Encounter for gynecological examination without abnormal finding - Plan: Basic metabolic panel, CBC with Differential/Platelet, Hepatic function panel, Lipid panel, TSH, Cytology - PAP  Medication management - Plan: Basic metabolic panel, CBC with Differential/Platelet, Hepatic function panel, Lipid panel, TSH  Patient Care Team: Burnis Medin, MD as PCP - General ent ortho  Patient Instructions  Will notify you  of labs when available. And PAP  Get an eye exam . Check with insurance about shingreix vaccine coverage and can make  Injection appt only.    Preventive Care 40-64 Years, Female Preventive care refers to lifestyle choices and visits with your health care provider that can promote health and wellness. What does preventive care include?  A yearly physical exam. This is also called an annual well check.  Dental exams once or twice a year.  Routine eye exams. Ask your health care provider how often you should have your eyes checked.  Personal lifestyle choices, including: ? Daily care of your teeth and gums. ? Regular physical activity. ? Eating a healthy diet. ? Avoiding tobacco and drug use. ? Limiting alcohol use. ? Practicing safe sex. ? Taking low-dose aspirin daily starting at age 63. ? Taking vitamin and mineral supplements as recommended by your health care provider. What happens during  an annual well check? The services and screenings done by your health care provider during your annual well check will depend on your  age, overall health, lifestyle risk factors, and family history of disease. Counseling Your health care provider may ask you questions about your:  Alcohol use.  Tobacco use.  Drug use.  Emotional well-being.  Home and relationship well-being.  Sexual activity.  Eating habits.  Work and work Statistician.  Method of birth control.  Menstrual cycle.  Pregnancy history.  Screening You may have the following tests or measurements:  Height, weight, and BMI.  Blood pressure.  Lipid and cholesterol levels. These may be checked every 5 years, or more frequently if you are over 33 years old.  Skin check.  Lung cancer screening. You may have this screening every year starting at age 22 if you have a 30-pack-year history of smoking and currently smoke or have quit within the past 15 years.  Fecal occult blood test (FOBT) of the stool. You may have this test every year starting at age 55.  Flexible sigmoidoscopy or colonoscopy. You may have a sigmoidoscopy every 5 years or a colonoscopy every 10 years starting at age 73.  Hepatitis C blood test.  Hepatitis B blood test.  Sexually transmitted disease (STD) testing.  Diabetes screening. This is done by checking your blood sugar (glucose) after you have not eaten for a while (fasting). You may have this done every 1-3 years.  Mammogram. This may be done every 1-2 years. Talk to your health care provider about when you should start having regular mammograms. This may depend on whether you have a family history of breast cancer.  BRCA-related cancer screening. This may be done if you have a family history of breast, ovarian, tubal, or peritoneal cancers.  Pelvic exam and Pap test. This may be done every 3 years starting at age 42. Starting at age 44, this may be done every 5 years if you have a Pap test in combination with an HPV test.  Bone density scan. This is done to screen for osteoporosis. You may have this scan if you  are at high risk for osteoporosis.  Discuss your test results, treatment options, and if necessary, the need for more tests with your health care provider. Vaccines Your health care provider may recommend certain vaccines, such as:  Influenza vaccine. This is recommended every year.  Tetanus, diphtheria, and acellular pertussis (Tdap, Td) vaccine. You may need a Td booster every 10 years.  Varicella vaccine. You may need this if you have not been vaccinated.  Zoster vaccine. You may need this after age 57.  Measles, mumps, and rubella (MMR) vaccine. You may need at least one dose of MMR if you were born in 1957 or later. You may also need a second dose.  Pneumococcal 13-valent conjugate (PCV13) vaccine. You may need this if you have certain conditions and were not previously vaccinated.  Pneumococcal polysaccharide (PPSV23) vaccine. You may need one or two doses if you smoke cigarettes or if you have certain conditions.  Meningococcal vaccine. You may need this if you have certain conditions.  Hepatitis A vaccine. You may need this if you have certain conditions or if you travel or work in places where you may be exposed to hepatitis A.  Hepatitis B vaccine. You may need this if you have certain conditions or if you travel or work in places where you may be exposed to hepatitis B.  Haemophilus influenzae type b (  Hib) vaccine. You may need this if you have certain conditions.  Talk to your health care provider about which screenings and vaccines you need and how often you need them. This information is not intended to replace advice given to you by your health care provider. Make sure you discuss any questions you have with your health care provider. Document Released: 02/27/2015 Document Revised: 10/21/2015 Document Reviewed: 12/02/2014 Elsevier Interactive Patient Education  2017 [place] K. Marcea Rojek M.D.

## 2016-11-29 ENCOUNTER — Encounter: Payer: Self-pay | Admitting: Internal Medicine

## 2016-11-29 ENCOUNTER — Other Ambulatory Visit (HOSPITAL_COMMUNITY)
Admission: RE | Admit: 2016-11-29 | Discharge: 2016-11-29 | Disposition: A | Discharge status: Home / Self Care | Payer: BC Managed Care – PPO | Source: Ambulatory Visit | Attending: Internal Medicine | Admitting: Internal Medicine

## 2016-11-29 ENCOUNTER — Ambulatory Visit (INDEPENDENT_AMBULATORY_CARE_PROVIDER_SITE_OTHER): Payer: BC Managed Care – PPO | Admitting: Internal Medicine

## 2016-11-29 VITALS — BP 132/76 | HR 84 | Temp 98.3°F | Ht 60.75 in | Wt 199.1 lb

## 2016-11-29 DIAGNOSIS — Z0000 Encounter for general adult medical examination without abnormal findings: Secondary | ICD-10-CM

## 2016-11-29 DIAGNOSIS — Z23 Encounter for immunization: Secondary | ICD-10-CM | POA: Diagnosis not present

## 2016-11-29 DIAGNOSIS — Z79899 Other long term (current) drug therapy: Secondary | ICD-10-CM | POA: Insufficient documentation

## 2016-11-29 DIAGNOSIS — E039 Hypothyroidism, unspecified: Secondary | ICD-10-CM | POA: Insufficient documentation

## 2016-11-29 DIAGNOSIS — Z8639 Personal history of other endocrine, nutritional and metabolic disease: Secondary | ICD-10-CM | POA: Insufficient documentation

## 2016-11-29 DIAGNOSIS — Z01419 Encounter for gynecological examination (general) (routine) without abnormal findings: Secondary | ICD-10-CM | POA: Insufficient documentation

## 2016-11-29 LAB — CBC WITH DIFFERENTIAL/PLATELET
Basophils Absolute: 0 10*3/uL (ref 0.0–0.1)
Basophils Relative: 0.3 % (ref 0.0–3.0)
EOS ABS: 0.2 10*3/uL (ref 0.0–0.7)
Eosinophils Relative: 2.5 % (ref 0.0–5.0)
HEMATOCRIT: 38.1 % (ref 36.0–46.0)
Hemoglobin: 12.5 g/dL (ref 12.0–15.0)
LYMPHS PCT: 33.1 % (ref 12.0–46.0)
Lymphs Abs: 2.1 10*3/uL (ref 0.7–4.0)
MCHC: 32.9 g/dL (ref 30.0–36.0)
MCV: 85.3 fl (ref 78.0–100.0)
MONOS PCT: 6.4 % (ref 3.0–12.0)
Monocytes Absolute: 0.4 10*3/uL (ref 0.1–1.0)
NEUTROS ABS: 3.6 10*3/uL (ref 1.4–7.7)
Neutrophils Relative %: 57.7 % (ref 43.0–77.0)
PLATELETS: 215 10*3/uL (ref 150.0–400.0)
RBC: 4.47 Mil/uL (ref 3.87–5.11)
RDW: 14.3 % (ref 11.5–15.5)
WBC: 6.3 10*3/uL (ref 4.0–10.5)

## 2016-11-29 LAB — LIPID PANEL
CHOL/HDL RATIO: 3
Cholesterol: 196 mg/dL (ref 0–200)
HDL: 66.5 mg/dL (ref >39.00)
LDL Cholesterol: 110 mg/dL — ABNORMAL HIGH (ref 0–99)
NONHDL: 129.86
Triglycerides: 98 mg/dL (ref 0.0–149.0)
VLDL: 19.6 mg/dL (ref 0.0–40.0)

## 2016-11-29 LAB — TSH: TSH: 3.61 u[IU]/mL (ref 0.35–4.50)

## 2016-11-29 LAB — BASIC METABOLIC PANEL
BUN: 18 mg/dL (ref 6–23)
CALCIUM: 9.6 mg/dL (ref 8.4–10.5)
CO2: 27 meq/L (ref 19–32)
CREATININE: 0.65 mg/dL (ref 0.40–1.20)
Chloride: 104 mEq/L (ref 96–112)
GFR: 97.97 mL/min (ref >60.00)
Glucose, Bld: 92 mg/dL (ref 70–99)
Potassium: 4.1 mEq/L (ref 3.5–5.1)
SODIUM: 140 meq/L (ref 135–145)

## 2016-11-29 LAB — HEPATIC FUNCTION PANEL
ALBUMIN: 4.2 g/dL (ref 3.5–5.2)
ALK PHOS: 72 U/L (ref 39–117)
ALT: 21 U/L (ref 0–35)
AST: 18 U/L (ref 0–37)
BILIRUBIN DIRECT: 0.1 mg/dL (ref 0.0–0.3)
TOTAL PROTEIN: 7.4 g/dL (ref 6.0–8.3)
Total Bilirubin: 0.5 mg/dL (ref 0.2–1.2)

## 2016-11-29 MED ORDER — ZOSTER VAC RECOMB ADJUVANTED 50 MCG/0.5ML IM SUSR: 0.5000 mL | Freq: Once | INTRAMUSCULAR | 1 refills | Status: AC

## 2016-11-29 NOTE — Patient Instructions (Addendum)
Will notify you  of labs when available. And PAP  Get an eye exam . Check with insurance about shingreix vaccine coverage and can make  Injection appt only.    Preventive Care 40-64 Years, Female Preventive care refers to lifestyle choices and visits with your health care provider that can promote health and wellness. What does preventive care include?  A yearly physical exam. This is also called an annual well check.  Dental exams once or twice a year.  Routine eye exams. Ask your health care provider how often you should have your eyes checked.  Personal lifestyle choices, including: ? Daily care of your teeth and gums. ? Regular physical activity. ? Eating a healthy diet. ? Avoiding tobacco and drug use. ? Limiting alcohol use. ? Practicing safe sex. ? Taking low-dose aspirin daily starting at age 10. ? Taking vitamin and mineral supplements as recommended by your health care provider. What happens during an annual well check? The services and screenings done by your health care provider during your annual well check will depend on your age, overall health, lifestyle risk factors, and family history of disease. Counseling Your health care provider may ask you questions about your:  Alcohol use.  Tobacco use.  Drug use.  Emotional well-being.  Home and relationship well-being.  Sexual activity.  Eating habits.  Work and work Statistician.  Method of birth control.  Menstrual cycle.  Pregnancy history.  Screening You may have the following tests or measurements:  Height, weight, and BMI.  Blood pressure.  Lipid and cholesterol levels. These may be checked every 5 years, or more frequently if you are over 70 years old.  Skin check.  Lung cancer screening. You may have this screening every year starting at age 66 if you have a 30-pack-year history of smoking and currently smoke or have quit within the past 15 years.  Fecal occult blood test (FOBT) of  the stool. You may have this test every year starting at age 18.  Flexible sigmoidoscopy or colonoscopy. You may have a sigmoidoscopy every 5 years or a colonoscopy every 10 years starting at age 84.  Hepatitis C blood test.  Hepatitis B blood test.  Sexually transmitted disease (STD) testing.  Diabetes screening. This is done by checking your blood sugar (glucose) after you have not eaten for a while (fasting). You may have this done every 1-3 years.  Mammogram. This may be done every 1-2 years. Talk to your health care provider about when you should start having regular mammograms. This may depend on whether you have a family history of breast cancer.  BRCA-related cancer screening. This may be done if you have a family history of breast, ovarian, tubal, or peritoneal cancers.  Pelvic exam and Pap test. This may be done every 3 years starting at age 63. Starting at age 77, this may be done every 5 years if you have a Pap test in combination with an HPV test.  Bone density scan. This is done to screen for osteoporosis. You may have this scan if you are at high risk for osteoporosis.  Discuss your test results, treatment options, and if necessary, the need for more tests with your health care provider. Vaccines Your health care provider may recommend certain vaccines, such as:  Influenza vaccine. This is recommended every year.  Tetanus, diphtheria, and acellular pertussis (Tdap, Td) vaccine. You may need a Td booster every 10 years.  Varicella vaccine. You may need this if you have not  been vaccinated.  Zoster vaccine. You may need this after age 2.  Measles, mumps, and rubella (MMR) vaccine. You may need at least one dose of MMR if you were born in 1957 or later. You may also need a second dose.  Pneumococcal 13-valent conjugate (PCV13) vaccine. You may need this if you have certain conditions and were not previously vaccinated.  Pneumococcal polysaccharide (PPSV23) vaccine.  You may need one or two doses if you smoke cigarettes or if you have certain conditions.  Meningococcal vaccine. You may need this if you have certain conditions.  Hepatitis A vaccine. You may need this if you have certain conditions or if you travel or work in places where you may be exposed to hepatitis A.  Hepatitis B vaccine. You may need this if you have certain conditions or if you travel or work in places where you may be exposed to hepatitis B.  Haemophilus influenzae type b (Hib) vaccine. You may need this if you have certain conditions.  Talk to your health care provider about which screenings and vaccines you need and how often you need them. This information is not intended to replace advice given to you by your health care provider. Make sure you discuss any questions you have with your health care provider. Document Released: 02/27/2015 Document Revised: 10/21/2015 Document Reviewed: 12/02/2014 Elsevier Interactive Patient Education  2017 [employer].

## 2016-11-30 LAB — CYTOLOGY - PAP
Diagnosis: NEGATIVE
HPV: NOT DETECTED

## 2016-11-30 NOTE — Progress Notes (Signed)
Tell patient PAP is normal. HPV high  risk is negative can repeat in 5 years

## 2017-02-24 ENCOUNTER — Encounter (INDEPENDENT_AMBULATORY_CARE_PROVIDER_SITE_OTHER): Payer: Self-pay

## 2017-03-23 ENCOUNTER — Other Ambulatory Visit: Payer: Self-pay | Admitting: Internal Medicine

## 2017-05-09 ENCOUNTER — Other Ambulatory Visit: Payer: Self-pay | Admitting: Internal Medicine

## 2017-05-09 DIAGNOSIS — Z1231 Encounter for screening mammogram for malignant neoplasm of breast: Secondary | ICD-10-CM

## 2017-05-30 ENCOUNTER — Ambulatory Visit
Admission: RE | Admit: 2017-05-30 | Discharge: 2017-05-30 | Disposition: A | Discharge status: Home / Self Care | Payer: BC Managed Care – PPO | Source: Ambulatory Visit | Attending: Internal Medicine | Admitting: Internal Medicine

## 2017-05-30 DIAGNOSIS — Z1231 Encounter for screening mammogram for malignant neoplasm of breast: Secondary | ICD-10-CM

## 2017-09-12 ENCOUNTER — Other Ambulatory Visit: Payer: Self-pay | Admitting: Internal Medicine

## 2018-03-08 ENCOUNTER — Other Ambulatory Visit: Payer: Self-pay | Admitting: Internal Medicine

## 2018-03-14 NOTE — Telephone Encounter (Signed)
Needs physical for further refills last one was 11/2016

## 2018-04-12 ENCOUNTER — Other Ambulatory Visit: Payer: Self-pay | Admitting: Internal Medicine

## 2018-04-12 NOTE — Telephone Encounter (Signed)
Last filled:03/14/2018 Last OV:11/29/16 Upcoming appt:none at this time

## 2018-04-13 NOTE — Telephone Encounter (Signed)
Please contact calll patient so she can get yearly appt  Or CPX  And so we can refill her med until then  . We can work her in if needed ,.  Want her to not run out of meds but   It has been over a year since seen

## 2018-05-08 ENCOUNTER — Other Ambulatory Visit: Payer: Self-pay | Admitting: Internal Medicine

## 2018-05-08 DIAGNOSIS — E039 Hypothyroidism, unspecified: Secondary | ICD-10-CM

## 2018-05-08 DIAGNOSIS — Z79899 Other long term (current) drug therapy: Secondary | ICD-10-CM

## 2018-05-08 DIAGNOSIS — Z0000 Encounter for general adult medical examination without abnormal findings: Secondary | ICD-10-CM

## 2018-05-08 NOTE — Telephone Encounter (Signed)
I sen in refill  I placed orders for  cpx labs  See if patient can get her labs done before  Her cps appt.

## 2018-05-23 ENCOUNTER — Encounter: Payer: BC Managed Care – PPO | Admitting: Internal Medicine

## 2018-06-29 ENCOUNTER — Other Ambulatory Visit: Payer: Self-pay | Admitting: Internal Medicine

## 2018-08-23 ENCOUNTER — Other Ambulatory Visit: Payer: Self-pay | Admitting: Internal Medicine

## 2018-08-27 NOTE — Progress Notes (Deleted)
No chief complaint on file.   HPI: Patient  Jackie Lewis  63 y.o. comes in today for Preventive Health Care visit   Health Maintenance  Topic Date Due  . HIV Screening  04/16/1970  . INFLUENZA VACCINE  09/15/2018  . MAMMOGRAM  05/31/2019  . PAP SMEAR-Modifier  11/30/2019  . COLONOSCOPY  02/01/2021  . TETANUS/TDAP  06/14/2025  . Hepatitis C Screening  Completed   Health Maintenance Review LIFESTYLE:  Exercise:   Tobacco/ETS: Alcohol:  Sugar beverages: Sleep: Drug use: no HH of  Work:    ROS:  GEN/ HEENT: No fever, significant weight changes sweats headaches vision problems hearing changes, CV/ PULM; No chest pain shortness of breath cough, syncope,edema  change in exercise tolerance. GI /GU: No adominal pain, vomiting, change in bowel habits. No blood in the stool. No significant GU symptoms. SKIN/HEME: ,no acute skin rashes suspicious lesions or bleeding. No lymphadenopathy, nodules, masses.  NEURO/ PSYCH:  No neurologic signs such as weakness numbness. No depression anxiety. IMM/ Allergy: No unusual infections.  Allergy .   REST of 12 system review negative except as per HPI   Past Medical History:  Diagnosis Date  . Abnormal thyroid blood test   . Allergic rhinitis   . Allergy   . Anxiety   . Carpal tunnel    Bil  . GERD (gastroesophageal reflux disease)   . Headache(784.0)   . Hearing loss    Bil/ worse on left side.  . Recurrent UTI   . Scleritis    hx  . Thyroid disease     Past Surgical History:  Procedure Laterality Date  . ESOPHAGOGASTRODUODENOSCOPY  3/09  . EXCISION RADIAL HEAD  2015   right elbow  . INNER EAR SURGERY     hole in right ear/ surg left ear 2-3 times  . UPPER GASTROINTESTINAL ENDOSCOPY      Family History  Problem Relation Age of Onset  . Stroke Father   . Parkinsonism Father   . Other Son        traumatic brain injury 2011  . Thyroid disease Unknown   . Hypertension Unknown   . Hyperlipidemia Unknown   . Uterine  cancer Sister   . Colon cancer Paternal Aunt   . Breast cancer Paternal Aunt   . Heart disease Sister   . Hemolytic uremic syndrome Daughter        atypical  under rx   . Breast cancer Cousin     Social History   Socioeconomic History  . Marital status: Married    Spouse name: Not on file  . Number of children: Not on file  . Years of education: Not on file  . Highest education level: Not on file  Occupational History  . Not on file  Social Needs  . Financial resource strain: Not on file  . Food insecurity    Worry: Not on file    Inability: Not on file  . Transportation needs    Medical: Not on file    Non-medical: Not on file  Tobacco Use  . Smoking status: Never Smoker  . Smokeless tobacco: Never Used  Substance and Sexual Activity  . Alcohol use: No    Alcohol/week: 0.0 standard drinks  . Drug use: No  . Sexual activity: Not on file  Lifestyle  . Physical activity    Days per week: Not on file    Minutes per session: Not on file  . Stress: Not on file  Relationships  . Social Herbalist on phone: Not on file    Gets together: Not on file    Attends religious service: Not on file    Active member of club or organization: Not on file    Attends meetings of clubs or organizations: Not on file    Relationship status: Not on file  Other Topics Concern  . 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    [place] country of origin   Bereaved parent   Son  speaking some  Walks feeds sabnd dressed self  Can go to gym for som exercise      Hh  of 2 . Daughter graduated from [employer]  grad school in [place] living in [place]    Daughter   In [place]    Outpatient Medications Prior to Visit  Medication Sig Dispense Refill  . calcium carbonate  (OS-CAL) 1250 (500 Ca) MG chewable tablet Chew 2 tablets by mouth daily.    . Cholecalciferol (VITAMIN D3) 1000 units CAPS Take 1 capsule by mouth daily.    Marland Kitchen ibuprofen (ADVIL,MOTRIN) 400 MG tablet Take by mouth. USING PRN FOR KNEE PAIN    . levothyroxine (SYNTHROID) 88 MCG tablet TAKE 1 TABLET (88 MCG TOTAL) BY MOUTH DAILY BEFORE BREAKFAST. NEEDS PHYSICAL FOR FURTHER REFILLS 30 tablet 2  . triamcinolone (KENALOG) 0.025 % cream Apply 1 application topically 2 (two) times daily as needed (itching).     Facility-Administered Medications Prior to Visit  Medication Dose Route Frequency Provider Last Rate Last Dose  . 0.9 %  sodium chloride infusion  500 mL Intravenous Continuous Pyrtle, Lajuan Lines, MD         EXAM:  There were no vitals taken for this visit.  There is no height or weight on file to calculate BMI. Wt Readings from Last 3 Encounters:  11/29/16 199 lb 1.6 oz (90.3 kg)  02/02/16 201 lb (91.2 kg)  01/19/16 201 lb 3.2 oz (91.3 kg)    Physical Exam: Vital signs reviewed QIO:[id number] is a well-developed well-nourished alert cooperative    who appearsr stated age in no acute distress.  HEENT: normocephalic atraumatic , Eyes: PERRL EOM's full, conjunctiva clear, Nares: paten,t no deformity discharge or tenderness., Ears: no deformity EAC's clear TMs with normal landmarks. Mouth: clear OP, no lesions, edema.  Moist mucous membranes. Dentition in adequate repair. NECK: supple without masses, thyromegaly or bruits. CHEST/PULM:  Clear to auscultation and percussion breath sounds equal no wheeze , rales or rhonchi. No chest wall deformities or tenderness. Breast: normal by inspection . No dimpling, discharge, masses, tenderness or discharge . CV: PMI is nondisplaced, S1 S2 no gallops, murmurs, rubs. Peripheral pulses are full without delay.No JVD .  ABDOMEN: Bowel sounds normal nontender  No guard or rebound, no hepato splenomegal no CVA tenderness.  No hernia. Extremtities:  No clubbing  cyanosis or edema, no acute joint swelling or redness no focal atrophy NEURO:  Oriented x3, cranial nerves 3-12 appear to be intact, no obvious focal weakness,gait within normal limits no abnormal reflexes or asymmetrical SKIN: No acute rashes normal turgor, color, no bruising or petechiae. PSYCH: Oriented, good eye contact, no obvious  depression anxiety, cognition and judgment appear normal. LN: no cervical axillary inguinal adenopathy  Lab Results  Component Value Date   WBC 6.3 11/29/2016   HGB 12.5 11/29/2016   HCT 38.1 11/29/2016   PLT 215.0 11/29/2016   GLUCOSE 92 11/29/2016   CHOL 196 11/29/2016   TRIG 98.0 11/29/2016   HDL 66.50 11/29/2016   LDLDIRECT 113.9 06/22/2010   LDLCALC 110 (H) 11/29/2016   ALT 21 11/29/2016   AST 18 11/29/2016   NA 140 11/29/2016   K 4.1 11/29/2016   CL 104 11/29/2016   CREATININE 0.65 11/29/2016   BUN 18 11/29/2016   CO2 27 11/29/2016   TSH 3.61 11/29/2016   HGBA1C 5.4 07/17/2008   MICROALBUR 0.7 04/14/2006    BP Readings from Last 3 Encounters:  11/29/16 132/76  02/02/16 (!) 98/55  11/25/15 136/78    Labplan  reviewed with patient   ASSESSMENT AND PLAN:  Discussed the following assessment and plan:    ICD-10-CM   1. Visit for preventive health examination  Z00.00   2. Medication management  Z79.899   3. Hypothyroidism, unspecified type  E03.9   4. History of hyperglycemia  Z86.39     Patient Care Team: Elijahjames Fuelling, Standley Brooking, MD as PCP - General ent ortho  There are no Patient Instructions on file for this visit.  Standley Brooking. Kiyon Fidalgo M.D.

## 2018-08-28 ENCOUNTER — Encounter: Payer: BC Managed Care – PPO | Admitting: Internal Medicine

## 2018-08-28 ENCOUNTER — Telehealth: Payer: Self-pay

## 2018-08-28 NOTE — Telephone Encounter (Signed)
Pt calling back to schedule. Unable to reach practice.

## 2018-08-28 NOTE — Telephone Encounter (Signed)
Clinic RN called patient to reschedule. No answer LVM for patient to return call.

## 2018-08-28 NOTE — Telephone Encounter (Signed)
Called pt to see if she was coming in to her appt. Pt stated that she wrote the wrong date down due to having 2 appt. Pt wants to know if she can reschedule Cpe appt.   Since I am helping out in the office and unaware of scheduling, will route to the front desk for assistance.

## 2018-08-30 NOTE — Telephone Encounter (Signed)
Pt has been called lmom for them to give [place] a call back to r/s this appointment.

## 2018-08-30 NOTE — Progress Notes (Signed)
Chief Complaint  Patient presents with  . Annual Exam    HPI: Patient  Jackie Lewis  63 y.o. comes in today for Preventive Health Care visit  Taking thyroid medication daily  No major change in health  Had bug bit still has lump right arm  Some aches and pain back   Left ear powder  No pain   Late for ent appt   Health Maintenance  Topic Date Due  . HIV Screening  04/16/1970  . INFLUENZA VACCINE  09/15/2018  . MAMMOGRAM  05/31/2019  . PAP SMEAR-Modifier  11/30/2019  . COLONOSCOPY  02/01/2021  . TETANUS/TDAP  06/14/2025  . 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 GI /GU: No adominal pain, vomiting, change in bowel habits. No blood in the stool. No significant GU symptoms. SKIN/HEME: ,no acute skin rashes suspicious lesions or bleeding. No lymphadenopathy, nodules, masses.  NEURO/ PSYCH:  No neurologic signs such as weakness numbness. No depression anxiety. IMM/ Allergy: No unusual infections.  Allergy .   REST of 12 system review negative except as per HPI   Past Medical History:  Diagnosis Date  . Abnormal thyroid blood test   . Allergic rhinitis   . Allergy   . Anxiety   . Carpal tunnel    Bil  . GERD (gastroesophageal reflux disease)   . Headache(784.0)   . Hearing loss    Bil/ worse on left side.  . Recurrent UTI   . Scleritis    hx  . Thyroid disease     Past Surgical History:  Procedure Laterality Date  . ESOPHAGOGASTRODUODENOSCOPY  3/09  . EXCISION RADIAL HEAD  2015   right elbow  . INNER EAR SURGERY     hole in right ear/ surg left ear 2-3  times  . UPPER GASTROINTESTINAL ENDOSCOPY      Family History  Problem Relation Age of Onset  . Stroke Father   . Parkinsonism Father   . Other Son        traumatic brain injury 2011  . Thyroid disease Other   . Hypertension Other   . Hyperlipidemia Other   . Uterine cancer Sister   . Colon cancer Paternal Aunt   . Breast cancer Paternal Aunt   . Heart disease Sister   . Hemolytic uremic syndrome Daughter        atypical  under rx   . Breast cancer Cousin     Social History   Socioeconomic History  . Marital status: Married    Spouse name: Not on file  . Number of children: Not on file  . Years of education: Not on file  . Highest education level: Not on file  Occupational History  . Not on file  Social Needs  . Financial resource strain: Not on file  . Food insecurity    Worry: Not on file    Inability: Not on file  . Transportation needs    Medical: Not on file    Non-medical: Not on file  Tobacco Use  . Smoking status: Never Smoker  . Smokeless tobacco: Never Used  Substance and Sexual Activity  . Alcohol use: No    Alcohol/week: 0.0 standard drinks  . Drug use: No  . Sexual activity: Not on file  Lifestyle  . Physical activity    Days per week: Not on file    Minutes per session: Not on file  . Stress: Not on file  Relationships  . Social Herbalist on phone: Not on file    Gets together: Not on file    Attends religious service: Not on file    Active member of club or organization: Not on file    Attends meetings of clubs or organizations: Not on file    Relationship status: Not on file  Other Topics Concern  . 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    [place] country of origin   Bereaved parent   Son  speaking some  Walks feeds sabnd dressed self   Can go to gym for som exercise      Hh  of 2 . Daughter graduated from [employer]  grad school in [place] living in [place]   Daughter   In [place]    Outpatient Medications Prior to Visit  Medication Sig Dispense Refill  . calcium carbonate (OS-CAL) 1250 (500 Ca) MG chewable tablet Chew 2 tablets by mouth daily.    . Cholecalciferol (VITAMIN D3) 1000 units CAPS Take 1 capsule by mouth daily.    Marland Kitchen ibuprofen (ADVIL,MOTRIN) 400 MG tablet Take by mouth. USING PRN FOR KNEE PAIN    . levothyroxine (SYNTHROID) 88 MCG tablet TAKE 1 TABLET (88 MCG TOTAL) BY MOUTH DAILY BEFORE BREAKFAST. NEEDS PHYSICAL FOR FURTHER REFILLS 30 tablet 2  . triamcinolone (KENALOG) 0.025 % cream Apply 1 application topically 2 (two) times daily as needed (itching).     Facility-Administered Medications Prior to Visit  Medication Dose Route Frequency Provider Last Rate Last Dose  . 0.9 %  sodium chloride infusion  500 mL Intravenous Continuous Pyrtle, Lajuan Lines, MD         EXAM:  BP 122/70   Pulse 90   Temp 98.3 F (36.8 C) (Oral)   Ht 5' 1" (1.549 m)   Wt 206 lb (93.4 kg)   SpO2 96%   BMI 38.92 kg/m   Body mass index is 38.92 kg/m. Wt Readings from Last 3 Encounters:  08/31/18 206 lb (93.4 kg)  11/29/16 199 lb 1.6 oz (90.3 kg)  02/02/16 201 lb (91.2 kg)    Physical Exam: Vital signs reviewed [id number] is a well-developed well-nourished alert cooperative    who appearsr stated age in no acute distress.  HEENT: normocephalic atraumatic , Eyes: PERRL EOM's full, conjunctiva clear, Nares: paten,t no deformity discharge or tenderness., Ears: no deformity EAC's clear TMs with normal landmarks. Mouth: clear OP, no lesions, edema.  Moist mucous membranes. Dentition in adequate repair. NECK: supple without masses, thyromegaly or bruits. CHEST/PULM:  Clear to auscultation and percussion breath sounds equal no wheeze , rales or rhonchi. No chest wall deformities or tenderness. Breast: normal by  inspection . No dimpling, discharge, masses, tenderness or discharge . CV: PMI is nondisplaced, S1 S2 no gallops, murmurs, rubs. Peripheral pulses are  full without delay.No JVD .  ABDOMEN: Bowel sounds normal nontender  No guard or rebound, no hepato splenomegal no CVA tenderness.  No hernia. Extremtities:  No clubbing cyanosis or edema, no acute joint swelling or redness no focal atrophy NEURO:  Oriented x3, cranial nerves 3-12 appear to be intact, no obvious focal weakness,gait within normal limits no abnormal reflexes or asymmetrical SKIN: No acute rashes normal turgor, color, no bruising or petechiae.  Small [place] lump right forearm  PSYCH: Oriented, good eye contact, no obvious depression anxiety, cognition and judgment appear normal. LN: no cervical axillary inguinal adenopathy  Lab Results  Component Value Date   WBC 6.3 11/29/2016   HGB 12.5 11/29/2016   HCT 38.1 11/29/2016   PLT 215.0 11/29/2016   GLUCOSE 92 11/29/2016   CHOL 196 11/29/2016   TRIG 98.0 11/29/2016   HDL 66.50 11/29/2016   LDLDIRECT 113.9 06/22/2010   LDLCALC 110 (H) 11/29/2016   ALT 21 11/29/2016   AST 18 11/29/2016   NA 140 11/29/2016   K 4.1 11/29/2016   CL 104 11/29/2016   CREATININE 0.65 11/29/2016   BUN 18 11/29/2016   CO2 27 11/29/2016   TSH 3.61 11/29/2016   HGBA1C 5.4 07/17/2008   MICROALBUR 0.7 04/14/2006    BP Readings from Last 3 Encounters:  08/31/18 122/70  11/29/16 132/76  02/02/16 (!) 98/55    Lab results reviewed with patient   ASSESSMENT AND PLAN:  Discussed the following assessment and plan:    ICD-10-CM   1. Visit for preventive health examination  Z00.00 Hemoglobin A1c    TSH    Lipid panel    Hepatic function panel    CBC with Differential/Platelet    Basic metabolic panel  2. Medication management  Z79.899 Hemoglobin A1c    TSH    Lipid panel    Hepatic function panel    CBC with Differential/Platelet    Basic metabolic panel  3. Hypothyroidism, unspecified type   E03.9 Hemoglobin A1c    TSH    Lipid panel    Hepatic function panel    CBC with Differential/Platelet    Basic metabolic panel  future  Labs are in system   Patient Care Team: Panosh, Standley Brooking, MD as PCP - General ent ortho  Patient Instructions   Will notify you  of labs when available.   If all ok then yearly cpx and  Get  Your flu vaccine in fall . Healthy weight loss will also help  Your future health.    Preventive Care 9-56 Years Old, Female Preventive care refers to visits with your health care provider and lifestyle choices that can promote health and wellness. This includes:  A yearly physical exam. This may also be called an annual well check.  Regular dental visits and eye exams.  Immunizations.  Screening for certain conditions.  Healthy lifestyle choices, such as eating a healthy diet, getting regular exercise, not using drugs or products that contain nicotine and tobacco, and limiting alcohol use. What can I expect for my preventive care visit? Physical exam Your health care provider will check your:  Height and weight. This may be used to calculate body mass index (BMI), which tells if you are at a healthy weight.  Heart rate and blood pressure.  Skin for abnormal spots. Counseling Your health care provider may ask you questions about your:  Alcohol, tobacco, and drug use.  Emotional well-being.  Home and relationship well-being.  Sexual activity.  Eating habits.  Work and work Statistician.  Method of birth control.  Menstrual cycle.  Pregnancy history. What immunizations do I need?  Influenza (flu) vaccine  This is recommended every year. Tetanus, diphtheria, and pertussis (Tdap) vaccine  You may need a Td booster every 10 years. Varicella (chickenpox) vaccine  You may need this if you have not been vaccinated. Zoster (shingles) vaccine  You may need this after age 5. Measles, mumps, and rubella (MMR) vaccine  You may need  at least one dose of MMR if you were born in 1957 or later. You may also need a second dose. Pneumococcal conjugate (PCV13) vaccine  You may need this if you have certain conditions and were not previously vaccinated. Pneumococcal polysaccharide (PPSV23) vaccine  You may need one or two doses if you smoke cigarettes or if you have certain conditions. Meningococcal conjugate (MenACWY) vaccine  You may need this if you have certain conditions. Hepatitis A vaccine  You may need this if you have certain conditions or if you travel or work in places where you may be exposed to hepatitis A. Hepatitis B vaccine  You may need this if you have certain conditions or if you travel or work in places where you may be exposed to hepatitis B. Haemophilus influenzae type b (Hib) vaccine  You may need this if you have certain conditions. Human papillomavirus (HPV) vaccine  If recommended by your health care provider, you may need three doses over 6 months. You may receive vaccines as individual doses or as more than one vaccine together in one shot (combination vaccines). Talk with your health care provider about the risks and benefits of combination vaccines. What tests do I need? Blood tests  Lipid and cholesterol levels. These may be checked every 5 years, or more frequently if you are over 36 years old.  Hepatitis C test.  Hepatitis B test. Screening  Lung cancer screening. You may have this screening every year starting at age 53 if you have a 30-pack-year history of smoking and currently smoke or have quit within the past 15 years.  Colorectal cancer screening. All adults should have this screening starting at age 13 and continuing until age 25. Your health care provider may recommend screening at age 10 if you are at increased risk. You will have tests every 1-10 years, depending on your results and the type of screening test.  Diabetes screening. This is done by checking your blood sugar  (glucose) after you have not eaten for a while (fasting). You may have this done every 1-3 years.  Mammogram. This may be done every 1-2 years. Talk with your health care provider about when you should start having regular mammograms. This may depend on whether you have a family history of breast cancer.  BRCA-related cancer screening. This may be done if you have a family history of breast, ovarian, tubal, or peritoneal cancers.  Pelvic exam and Pap test. This may be done every 3 years starting at age 40. Starting at age 26, this may be done every 5 years if you have a Pap test in combination with an HPV test. Other tests  Sexually transmitted disease (STD) testing.  Bone density scan. This is done to screen for osteoporosis. You may have this scan if you are at high risk for osteoporosis. Follow these instructions at home: Eating and drinking  Eat a diet that includes fresh fruits and vegetables, whole grains, lean protein, and low-fat dairy.  Take vitamin and mineral supplements as  recommended by your health care provider.  Do not drink alcohol if: ? Your health care provider tells you not to drink. ? You are pregnant, may be pregnant, or are planning to become pregnant.  If you drink alcohol: ? Limit how much you have to 0-1 drink a day. ? Be aware of how much alcohol is in your drink. In the [place] one drink equals one 12 oz bottle of beer (355 mL), one 5 oz glass of wine (148 mL), or one 1 oz glass of hard liquor (44 mL). Lifestyle  Take daily care of your teeth and gums.  Stay active. Exercise for at least 30 minutes on 5 or more days each week.  Do not use any products that contain nicotine or tobacco, such as cigarettes, e-cigarettes, and chewing tobacco. If you need help quitting, ask your health care provider.  If you are sexually active, practice safe sex. Use a condom or other form of birth control (contraception) in order to prevent pregnancy and STIs (sexually  transmitted infections).  If told by your health care provider, take low-dose aspirin daily starting at age 66. What's next?  Visit your health care provider once a year for a well check visit.  Ask your health care provider how often you should have your eyes and teeth checked.  Stay up to date on all vaccines. This information is not intended to replace advice given to you by your health care provider. Make sure you discuss any questions you have with your health care provider. Document Released: 02/27/2015 Document Revised: 10/12/2017 Document Reviewed: 10/12/2017 Elsevier Patient Education  2020 Waldo Panosh M.D.

## 2018-08-31 ENCOUNTER — Encounter: Payer: Self-pay | Admitting: Internal Medicine

## 2018-08-31 ENCOUNTER — Ambulatory Visit (INDEPENDENT_AMBULATORY_CARE_PROVIDER_SITE_OTHER): Payer: BC Managed Care – PPO | Admitting: Internal Medicine

## 2018-08-31 ENCOUNTER — Other Ambulatory Visit: Payer: Self-pay

## 2018-08-31 VITALS — BP 122/70 | HR 90 | Temp 98.3°F | Ht 61.0 in | Wt 206.0 lb

## 2018-08-31 DIAGNOSIS — Z79899 Other long term (current) drug therapy: Secondary | ICD-10-CM | POA: Diagnosis not present

## 2018-08-31 DIAGNOSIS — Z0000 Encounter for general adult medical examination without abnormal findings: Secondary | ICD-10-CM

## 2018-08-31 DIAGNOSIS — E039 Hypothyroidism, unspecified: Secondary | ICD-10-CM | POA: Diagnosis not present

## 2018-08-31 LAB — HEPATIC FUNCTION PANEL
ALT: 19 U/L (ref 0–35)
AST: 17 U/L (ref 0–37)
Albumin: 4.3 g/dL (ref 3.5–5.2)
Alkaline Phosphatase: 70 U/L (ref 39–117)
Bilirubin, Direct: 0.1 mg/dL (ref 0.0–0.3)
Total Bilirubin: 0.5 mg/dL (ref 0.2–1.2)
Total Protein: 7.3 g/dL (ref 6.0–8.3)

## 2018-08-31 LAB — CBC WITH DIFFERENTIAL/PLATELET
Basophils Absolute: 0 10*3/uL (ref 0.0–0.1)
Basophils Relative: 0.5 % (ref 0.0–3.0)
Eosinophils Absolute: 0.2 10*3/uL (ref 0.0–0.7)
Eosinophils Relative: 3.3 % (ref 0.0–5.0)
HCT: 37.1 % (ref 36.0–46.0)
Hemoglobin: 12.1 g/dL (ref 12.0–15.0)
Lymphocytes Relative: 33.6 % (ref 12.0–46.0)
Lymphs Abs: 1.9 10*3/uL (ref 0.7–4.0)
MCHC: 32.6 g/dL (ref 30.0–36.0)
MCV: 84.3 fl (ref 78.0–100.0)
Monocytes Absolute: 0.4 10*3/uL (ref 0.1–1.0)
Monocytes Relative: 7 % (ref 3.0–12.0)
Neutro Abs: 3.1 10*3/uL (ref 1.4–7.7)
Neutrophils Relative %: 55.6 % (ref 43.0–77.0)
Platelets: 211 10*3/uL (ref 150.0–400.0)
RBC: 4.4 Mil/uL (ref 3.87–5.11)
RDW: 14.3 % (ref 11.5–15.5)
WBC: 5.6 10*3/uL (ref 4.0–10.5)

## 2018-08-31 LAB — LIPID PANEL
Cholesterol: 192 mg/dL (ref 0–200)
HDL: 66 mg/dL (ref >39.00)
LDL Cholesterol: 106 mg/dL — ABNORMAL HIGH (ref 0–99)
NonHDL: 126.11
Total CHOL/HDL Ratio: 3
Triglycerides: 102 mg/dL (ref 0.0–149.0)
VLDL: 20.4 mg/dL (ref 0.0–40.0)

## 2018-08-31 LAB — BASIC METABOLIC PANEL
BUN: 16 mg/dL (ref 6–23)
CO2: 28 mEq/L (ref 19–32)
Calcium: 9.6 mg/dL (ref 8.4–10.5)
Chloride: 104 mEq/L (ref 96–112)
Creatinine, Ser: 0.75 mg/dL (ref 0.40–1.20)
GFR: 77.71 mL/min (ref >60.00)
Glucose, Bld: 90 mg/dL (ref 70–99)
Potassium: 4.2 mEq/L (ref 3.5–5.1)
Sodium: 142 mEq/L (ref 135–145)

## 2018-08-31 LAB — HEMOGLOBIN A1C: Hgb A1c MFr Bld: 5.5 % (ref 4.6–6.5)

## 2018-08-31 LAB — TSH: TSH: 2.83 u[IU]/mL (ref 0.35–4.50)

## 2018-08-31 NOTE — Patient Instructions (Signed)
Will notify you  of labs when available.   If all ok then yearly cpx and  Get  Your flu vaccine in fall . Healthy weight loss will also help  Your future health.    Preventive Care 46-64 Years Old, Female Preventive care refers to visits with your health care provider and lifestyle choices that can promote health and wellness. This includes:  A yearly physical exam. This may also be called an annual well check.  Regular dental visits and eye exams.  Immunizations.  Screening for certain conditions.  Healthy lifestyle choices, such as eating a healthy diet, getting regular exercise, not using drugs or products that contain nicotine and tobacco, and limiting alcohol use. What can I expect for my preventive care visit? Physical exam Your health care provider will check your:  Height and weight. This may be used to calculate body mass index (BMI), which tells if you are at a healthy weight.  Heart rate and blood pressure.  Skin for abnormal spots. Counseling Your health care provider may ask you questions about your:  Alcohol, tobacco, and drug use.  Emotional well-being.  Home and relationship well-being.  Sexual activity.  Eating habits.  Work and work Statistician.  Method of birth control.  Menstrual cycle.  Pregnancy history. What immunizations do I need?  Influenza (flu) vaccine  This is recommended every year. Tetanus, diphtheria, and pertussis (Tdap) vaccine  You may need a Td booster every 10 years. Varicella (chickenpox) vaccine  You may need this if you have not been vaccinated. Zoster (shingles) vaccine  You may need this after age 102. Measles, mumps, and rubella (MMR) vaccine  You may need at least one dose of MMR if you were born in 1957 or later. You may also need a second dose. Pneumococcal conjugate (PCV13) vaccine  You may need this if you have certain conditions and were not previously vaccinated. Pneumococcal polysaccharide (PPSV23)  vaccine  You may need one or two doses if you smoke cigarettes or if you have certain conditions. Meningococcal conjugate (MenACWY) vaccine  You may need this if you have certain conditions. Hepatitis A vaccine  You may need this if you have certain conditions or if you travel or work in places where you may be exposed to hepatitis A. Hepatitis B vaccine  You may need this if you have certain conditions or if you travel or work in places where you may be exposed to hepatitis B. Haemophilus influenzae type b (Hib) vaccine  You may need this if you have certain conditions. Human papillomavirus (HPV) vaccine  If recommended by your health care provider, you may need three doses over 6 months. You may receive vaccines as individual doses or as more than one vaccine together in one shot (combination vaccines). Talk with your health care provider about the risks and benefits of combination vaccines. What tests do I need? Blood tests  Lipid and cholesterol levels. These may be checked every 5 years, or more frequently if you are over 39 years old.  Hepatitis C test.  Hepatitis B test. Screening  Lung cancer screening. You may have this screening every year starting at age 38 if you have a 30-pack-year history of smoking and currently smoke or have quit within the past 15 years.  Colorectal cancer screening. All adults should have this screening starting at age 21 and continuing until age 47. Your health care provider may recommend screening at age 75 if you are at increased risk. You will have  tests every 1-10 years, depending on your results and the type of screening test.  Diabetes screening. This is done by checking your blood sugar (glucose) after you have not eaten for a while (fasting). You may have this done every 1-3 years.  Mammogram. This may be done every 1-2 years. Talk with your health care provider about when you should start having regular mammograms. This may depend on  whether you have a family history of breast cancer.  BRCA-related cancer screening. This may be done if you have a family history of breast, ovarian, tubal, or peritoneal cancers.  Pelvic exam and Pap test. This may be done every 3 years starting at age 74. Starting at age 21, this may be done every 5 years if you have a Pap test in combination with an HPV test. Other tests  Sexually transmitted disease (STD) testing.  Bone density scan. This is done to screen for osteoporosis. You may have this scan if you are at high risk for osteoporosis. Follow these instructions at home: Eating and drinking  Eat a diet that includes fresh fruits and vegetables, whole grains, lean protein, and low-fat dairy.  Take vitamin and mineral supplements as recommended by your health care provider.  Do not drink alcohol if: ? Your health care provider tells you not to drink. ? You are pregnant, may be pregnant, or are planning to become pregnant.  If you drink alcohol: ? Limit how much you have to 0-1 drink a day. ? Be aware of how much alcohol is in your drink. In the [place] one drink equals one 12 oz bottle of beer (355 mL), one 5 oz glass of wine (148 mL), or one 1 oz glass of hard liquor (44 mL). Lifestyle  Take daily care of your teeth and gums.  Stay active. Exercise for at least 30 minutes on 5 or more days each week.  Do not use any products that contain nicotine or tobacco, such as cigarettes, e-cigarettes, and chewing tobacco. If you need help quitting, ask your health care provider.  If you are sexually active, practice safe sex. Use a condom or other form of birth control (contraception) in order to prevent pregnancy and STIs (sexually transmitted infections).  If told by your health care provider, take low-dose aspirin daily starting at age 75. What's next?  Visit your health care provider once a year for a well check visit.  Ask your health care provider how often you should have your  eyes and teeth checked.  Stay up to date on all vaccines. This information is not intended to replace advice given to you by your health care provider. Make sure you discuss any questions you have with your health care provider. Document Released: 02/27/2015 Document Revised: 10/12/2017 Document Reviewed: 10/12/2017 Elsevier Patient Education  2020 [employer].

## 2018-09-03 NOTE — Telephone Encounter (Signed)
Pt was rescheduled by Arbie Cookey and was seen on Friday 08/31/18

## 2018-10-23 ENCOUNTER — Other Ambulatory Visit: Payer: Self-pay | Admitting: Internal Medicine

## 2018-12-06 ENCOUNTER — Other Ambulatory Visit: Payer: Self-pay | Admitting: Internal Medicine

## 2018-12-06 DIAGNOSIS — Z1231 Encounter for screening mammogram for malignant neoplasm of breast: Secondary | ICD-10-CM

## 2019-01-29 ENCOUNTER — Other Ambulatory Visit: Payer: Self-pay

## 2019-01-29 ENCOUNTER — Telehealth: Payer: Self-pay | Admitting: Internal Medicine

## 2019-01-29 ENCOUNTER — Ambulatory Visit
Admission: RE | Admit: 2019-01-29 | Discharge: 2019-01-29 | Disposition: A | Discharge status: Home / Self Care | Payer: BC Managed Care – PPO | Source: Ambulatory Visit | Attending: Internal Medicine | Admitting: Internal Medicine

## 2019-01-29 DIAGNOSIS — Z1231 Encounter for screening mammogram for malignant neoplasm of breast: Secondary | ICD-10-CM

## 2019-01-29 NOTE — Telephone Encounter (Signed)
rx refill levothyroxine (SYNTHROID) 88 MCG tablet  PHARM[facility]Y [id number] - [place], [place] - [place] Phone:  [phone]  Fax:  [phone]

## 2019-01-30 MED ORDER — LEVOTHYROXINE SODIUM 88 MCG PO TABS: 88.0000 ug | ORAL_TABLET | Freq: Every day | ORAL | 2 refills | Status: DC

## 2019-01-30 NOTE — Telephone Encounter (Signed)
Refill has been sent in.  

## 2019-03-25 ENCOUNTER — Other Ambulatory Visit: Payer: Self-pay | Admitting: Internal Medicine

## 2019-05-21 ENCOUNTER — Telehealth: Payer: Self-pay

## 2019-05-21 ENCOUNTER — Telehealth (INDEPENDENT_AMBULATORY_CARE_PROVIDER_SITE_OTHER): Payer: BC Managed Care – PPO | Admitting: Internal Medicine

## 2019-05-21 ENCOUNTER — Encounter: Payer: Self-pay | Admitting: Internal Medicine

## 2019-05-21 ENCOUNTER — Other Ambulatory Visit: Payer: Self-pay

## 2019-05-21 VITALS — Ht 61.0 in | Wt 206.0 lb

## 2019-05-21 DIAGNOSIS — R062 Wheezing: Secondary | ICD-10-CM | POA: Diagnosis not present

## 2019-05-21 MED ORDER — ALBUTEROL SULFATE HFA 108 (90 BASE) MCG/ACT IN AERS: 1.0000 | INHALATION_SPRAY | Freq: Four times a day (QID) | RESPIRATORY_TRACT | 1 refills | Status: DC | PRN

## 2019-05-21 NOTE — Progress Notes (Signed)
Virtual Visit via Video Note  I connected with@ on 05/21/19 at  1:30 PM EDT by a video enabled telemedicine application and verified that I am speaking with the correct person using two identifiers. Location patient: home Location provider:work  office Persons participating in the virtual visit: patient, provider husband also in picture  WIth national recommendations  regarding COVID 19 pandemic   video visit is advised over in office visit for this patient.  Patient aware  of the limitations of evaluation and management by telemedicine and  availability of in person appointments. and agreed to proceed.   HPI: Jackie Lewis presents for video visit  Had requested a pulmonary consult     Concern about wheezing  And breathing problem off and on for a while more recently had some over weekend  Has some righ sinus congestion that is worse with laying down at night    No cough  But wheezing ( exhalation)  No hx of asthma  Tried Claritin x 1 ? Some help.  A few years ago had wheezing with a viral infection that response to nebulizer. No cp real sob edema or change in health otherwise    Family wants to make sure not a serious problem but is problematic for her  Had pfizer covid vaccine  In March  ROS: See pertinent positives and negatives per HPI.  Past Medical History:  Diagnosis Date  . Abnormal thyroid blood test   . Allergic rhinitis   . Allergy   . Anxiety   . Carpal tunnel    Bil  . GERD (gastroesophageal reflux disease)   . Headache(784.0)   . Hearing loss    Bil/ worse on left side.  . Recurrent UTI   . Scleritis    hx  . Thyroid disease     Past Surgical History:  Procedure Laterality Date  . ESOPHAGOGASTRODUODENOSCOPY  3/09  . EXCISION RADIAL HEAD  2015   right elbow  . INNER EAR SURGERY     hole in right ear/ surg left ear 2-3 times  . UPPER GASTROINTESTINAL ENDOSCOPY      Family History  Problem Relation Age of Onset  . Stroke Father   . Parkinsonism Father    . Other Son        traumatic brain injury 2011  . Thyroid disease Other   . Hypertension Other   . Hyperlipidemia Other   . Uterine cancer Sister   . Colon cancer Paternal Aunt   . Breast cancer Paternal Aunt   . Heart disease Sister   . Hemolytic uremic syndrome Daughter        atypical  under rx   . Breast cancer Cousin     Social History   Tobacco Use  . Smoking status: Never Smoker  . Smokeless tobacco: Never Used  Substance Use Topics  . Alcohol use: No    Alcohol/week: 0.0 standard drinks  . Drug use: No      Current Outpatient Medications:  .  calcium carbonate (OS-CAL) 1250 (500 Ca) MG chewable tablet, Chew 2 tablets by mouth daily., Disp: , Rfl:  .  Cholecalciferol (VITAMIN D3) 1000 units CAPS, Take 1 capsule by mouth daily., Disp: , Rfl:  .  levothyroxine (SYNTHROID) 88 MCG tablet, TAKE 1 TABLET (88 MCG TOTAL) BY MOUTH DAILY BEFORE BREAKFAST. NEEDS PHYSICAL FOR FURTHER REFILLS, Disp: 30 tablet, Rfl: 2 .  loratadine (CLARITIN) 10 MG tablet, Take 10 mg by mouth daily as needed for allergies., Disp: ,  Rfl:  .  triamcinolone (KENALOG) 0.025 % cream, Apply 1 application topically 2 (two) times daily as needed (itching)., Disp: , Rfl:  .  albuterol (VENTOLIN HFA) 108 (90 Base) MCG/ACT inhaler, Inhale 1-2 puffs into the lungs every 6 (six) hours as needed for wheezing or shortness of breath., Disp: 18 g, Rfl: 1 .  ibuprofen (ADVIL,MOTRIN) 400 MG tablet, Take by mouth. USING PRN FOR KNEE PAIN, Disp: , Rfl:   Current Facility-Administered Medications:  .  0.9 %  sodium chloride infusion, 500 mL, Intravenous, Continuous, Pyrtle, Lajuan Lines, MD  EXAM: BP Readings from Last 3 Encounters:  08/31/18 122/70  11/29/16 132/76  02/02/16 (!) 98/55    VITALS per patient if applicable:  GENERAL: alert, oriented, appears well and in no acute distress  Has exp wheeze on forced expiration but color is good  ( reports no fever)   HEENT: atraumatic, conjunttiva clear, no obvious  abnormalities on inspection of external nose and ears  NECK: normal movements of the head and neck  LUNGS: on inspection no signs of respiratory distress, breathing rate appears prolonged  On expiration , no  No audible wheezing  Color good   CV: no obvious cyanosis  MS: moves all visible extremities without noticeable abnormality  PSYCH/NEURO: pleasant and cooperative, no obvious depression or anxiety, speech and thought processing grossly intact Lab Results  Component Value Date   WBC 5.6 08/31/2018   HGB 12.1 08/31/2018   HCT 37.1 08/31/2018   PLT 211.0 08/31/2018   GLUCOSE 90 08/31/2018   CHOL 192 08/31/2018   TRIG 102.0 08/31/2018   HDL 66.00 08/31/2018   LDLDIRECT 113.9 06/22/2010   LDLCALC 106 (H) 08/31/2018   ALT 19 08/31/2018   AST 17 08/31/2018   NA 142 08/31/2018   K 4.2 08/31/2018   CL 104 08/31/2018   CREATININE 0.75 08/31/2018   BUN 16 08/31/2018   CO2 28 08/31/2018   TSH 2.83 08/31/2018   HGBA1C 5.5 08/31/2018   MICROALBUR 0.7 04/14/2006    ASSESSMENT AND PLAN:  Discussed the following assessment and plan:    ICD-10-CM   1. Wheezing  R06.2 Ambulatory referral to Pulmonology    on going worse recently  no hx asthma but had WARI in past   On going off and on and concern about sig sx   Today  Take Claritin and add flonase daily incase sinus congestion involved and albuterol as needed at this time  Advise further eval that may need cxray spirometry other    Empiric rx today   Fu with alarm sx  Counseled. Seek interim emergenct car in interim if alarm sx   Expectant management and discussion of plan and treatment with opportunity to ask questions and all were answered. The patient agreed with the plan and demonstrated an understanding of the instructions.   Advised to call back or seek an in-person evaluation if worsening  or having  further concerns . Return if symptoms worsen or fail to improve  before consults.    Shanon Ace, MD

## 2019-05-21 NOTE — Telephone Encounter (Signed)
Called patient in response to her MyChart message and left a detailed voice message for patient to call and set up a virtual visit for her wheezing for Dr. Regis Bill to place a referral since we have not seen her for this since 2017.

## 2019-06-14 ENCOUNTER — Encounter: Payer: Self-pay | Admitting: Emergency Medicine

## 2019-06-14 ENCOUNTER — Other Ambulatory Visit: Payer: Self-pay

## 2019-06-14 ENCOUNTER — Ambulatory Visit: Payer: BC Managed Care – PPO | Admitting: Emergency Medicine

## 2019-06-14 DIAGNOSIS — R062 Wheezing: Secondary | ICD-10-CM

## 2019-06-14 MED ORDER — FLUTICASONE PROPIONATE 50 MCG/ACT NA SUSP: 2.0000 | Freq: Every day | NASAL | 2 refills | Status: DC

## 2019-06-14 NOTE — Patient Instructions (Signed)
Please start taking your loratadine 10 mg once daily (Claritin) every day until next visit Try starting nasal saline rinses once daily to help clear mucus and allergens from your nose and sinuses Start fluticasone nasal spray, 2 sprays each nostril once daily. Depending on how you respond we will decide about performing breathing testing or chest x-ray next time. Follow with Dr Lamonte Sakai in 1 month

## 2019-06-14 NOTE — Progress Notes (Signed)
Subjective:    Patient ID: Jackie Lewis, female    DOB: 08-28-1955, 64 y.o.   MRN: [id number]  HPI 64 year old never smoker with a history of hypothyroidism, allergic rhinitis, GERD. No history of asthma.   She is referred today for evaluation of dyspnea, wheezing. She hears a noise in her throat, happens on expiration, seems to be associated with amount of congestion and sinus congestion. Seems to be worst in the pm, when she is paying down to sleep. She was given albuterol but has not tried. She is more SOB with ambulation and exertion. Feels some SOB with climbing stairs. She has gained about 10 lbs over the last 2 years. Minimal cough, no real sputum. No real GERD sx. Tried loratadine once.    Review of Systems As per HPI   Past Medical History:  Diagnosis Date  . Abnormal thyroid blood test   . Allergic rhinitis   . Allergy   . Anxiety   . Carpal tunnel    Bil  . GERD (gastroesophageal reflux disease)   . Headache(784.0)   . Hearing loss    Bil/ worse on left side.  . Recurrent UTI   . Scleritis    hx  . Thyroid disease      Family History  Problem Relation Age of Onset  . Stroke Father   . Parkinsonism Father   . Other Son        traumatic brain injury 2011  . Thyroid disease Other   . Hypertension Other   . Hyperlipidemia Other   . Uterine cancer Sister   . Colon cancer Paternal Aunt   . Breast cancer Paternal Aunt   . Heart disease Sister   . Hemolytic uremic syndrome Daughter        atypical  under rx   . Breast cancer Cousin     Children have asthma.   Social History   Socioeconomic History  . Marital status: Married    Spouse name: Not on file  . Number of children: Not on file  . Years of education: Not on file  . Highest education level: Not on file  Occupational History  . Not on file  Tobacco Use  . Smoking status: Never Smoker  . Smokeless tobacco: Never Used  Substance and Sexual Activity  . Alcohol use: No    Alcohol/week: 0.0  standard drinks  . Drug use: No  . Sexual activity: Not on file  Other Topics Concern  . 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    [place] country of origin   Bereaved parent   Son  speaking some  Walks feeds sabnd dressed self  Can go to gym for som exercise      Hh  of 2 . Daughter graduated from [employer]  grad school in [place] living in [place]   Daughter   In [place]   Social Determinants of Health   Financial Resource Strain:   . Difficulty of Paying Living Expenses:   Food Insecurity:   . Worried About Charity fundraiser in the Last Year:   . Arboriculturist in the Last Year:   [employer]  Needs:   . Lack of Transportation (Medical):   Marland Kitchen Lack of Transportation (Non-Medical):   Physical Activity:   . Days of Exercise per Week:   . Minutes of Exercise per Session:   Stress:   . Feeling of Stress :   Social Connections:   . Frequency of Communication with Friends and Family:   . Frequency of Social Gatherings with Friends and Family:   . Attends Religious Services:   . Active Member of Clubs or Organizations:   . Attends Archivist Meetings:   Marland Kitchen Marital Status:   Intimate Partner Violence:   . Fear of Current or Ex-Partner:   . Emotionally Abused:   Marland Kitchen Physically Abused:   . 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  No real inhaled exposures.   Allergies  Allergen Reactions  . Epinephrine     REACTION: dental injection got tachycardia  . Nitrofurantoin Monohyd Macro Rash     Outpatient Medications Prior to Visit  Medication Sig Dispense Refill  . calcium carbonate (OS-CAL) 1250 (500 Ca) MG chewable tablet Chew 2 tablets by mouth daily.    . Cholecalciferol  (VITAMIN D3) 1000 units CAPS Take 1 capsule by mouth daily.    Marland Kitchen ibuprofen (ADVIL,MOTRIN) 400 MG tablet Take by mouth. USING PRN FOR KNEE PAIN    . levothyroxine (SYNTHROID) 88 MCG tablet TAKE 1 TABLET (88 MCG TOTAL) BY MOUTH DAILY BEFORE BREAKFAST. NEEDS PHYSICAL FOR FURTHER REFILLS 30 tablet 2  . albuterol (VENTOLIN HFA) 108 (90 Base) MCG/ACT inhaler Inhale 1-2 puffs into the lungs every 6 (six) hours as needed for wheezing or shortness of breath. (Patient not taking: Reported on 06/14/2019) 18 g 65  . loratadine (CLARITIN) 10 MG tablet Take 10 mg by mouth daily as needed for allergies.    Marland Kitchen triamcinolone (KENALOG) 0.025 % cream Apply 1 application topically 2 (two) times daily as needed (itching).     Facility-Administered Medications Prior to Visit  Medication Dose Route Frequency Provider Last Rate Last Admin  . 0.9 %  sodium chloride infusion  500 mL Intravenous Continuous Pyrtle, Lajuan Lines, MD            Objective:   Physical Exam Vitals:   06/14/19 1140  BP: 130/78  Pulse: 87  Temp: (!) 97.3 F (36.3 C)  TempSrc: Temporal  SpO2: 98%  Weight: 213 lb 6.4 oz (96.8 kg)  Height: 5' 1.5" (1.562 m)   Gen: Pleasant, obese woman, in no distress,  normal affect  ENT: No lesions,  mouth clear,  oropharynx clear, no postnasal drip  Neck: No JVD, no stridor currently although she can make it happen intentionally  Lungs: No use of accessory muscles, no crackles or wheezing on normal respiration, no wheeze on forced expiration  Cardiovascular: RRR, heart sounds normal, no murmur or gallops, no peripheral edema  Musculoskeletal: No deformities, no cyanosis or clubbing  Neuro: alert, awake, non focal  Skin: Warm, no lesions or rash      Assessment & Plan:  Wheeze Based on her description and symptoms this sounds like upper airway noise.  It is worst when she is dealing with increased mucus and sinus drainage.  She denies any GERD.  We will treat her rhinitis and sinus disease more  aggressively, follow her for improvement in her dyspnea and upper airway noise.  If we do not make  progress then I believe she needs further work-up.  I offered her chest x-ray today, she wants to defer to see how she improves.  I think this is reasonable.  We will perform chest x-ray, PFT going forward if she does not improve with this initial strategy.  Please start taking your loratadine 10 mg once daily (Claritin) every day until next visit Try starting nasal saline rinses once daily to help clear mucus and allergens from your nose and sinuses Start fluticasone nasal spray, 2 sprays each nostril once daily. Depending on how you respond we will decide about performing breathing testing or chest x-ray next time. Follow with Dr Lamonte Sakai in 1 month  Baltazar Apo, MD, PhD 06/14/2019, 12:09 PM [facility] [phone] or if no answer [phone]

## 2019-06-14 NOTE — Assessment & Plan Note (Signed)
Based on her description and symptoms this sounds like upper airway noise.  It is worst when she is dealing with increased mucus and sinus drainage.  She denies any GERD.  We will treat her rhinitis and sinus disease more aggressively, follow her for improvement in her dyspnea and upper airway noise.  If we do not make progress then I believe she needs further work-up.  I offered her chest x-ray today, she wants to defer to see how she improves.  I think this is reasonable.  We will perform chest x-ray, PFT going forward if she does not improve with this initial strategy.  Please start taking your loratadine 10 mg once daily (Claritin) every day until next visit Try starting nasal saline rinses once daily to help clear mucus and allergens from your nose and sinuses Start fluticasone nasal spray, 2 sprays each nostril once daily. Depending on how you respond we will decide about performing breathing testing or chest x-ray next time. Follow with Dr Lamonte Sakai in 1 month

## 2019-06-24 ENCOUNTER — Other Ambulatory Visit: Payer: Self-pay | Admitting: Internal Medicine

## 2019-07-22 ENCOUNTER — Other Ambulatory Visit: Payer: Self-pay

## 2019-07-22 ENCOUNTER — Ambulatory Visit: Payer: BC Managed Care – PPO | Admitting: Emergency Medicine

## 2019-07-22 ENCOUNTER — Encounter: Payer: Self-pay | Admitting: Emergency Medicine

## 2019-07-22 DIAGNOSIS — R062 Wheezing: Secondary | ICD-10-CM

## 2019-07-22 NOTE — Patient Instructions (Signed)
Try starting the nasal saline rinses once daily. Continue loratadine (Claritin) once daily for now.  You could try stopping this in about a month to see if you have recurrent sinus and wheezing symptoms Continue fluticasone nasal spray, 2 sprays each nostril once daily for now.  As with the Claritin he could experiment with stopping this to see if you miss it. Please call our office and follow-up with Dr. Lamonte Sakai if you have any problems or recurrent symptoms so that we can troubleshoot.

## 2019-07-22 NOTE — Assessment & Plan Note (Signed)
Upper airway noise, stridor principally due to rhinitis.  Much better controlled now.  We will get her on [place] and see if she is able to come off either Claritin, fluticasone nasal spray or both.  Follow-up as needed.  Try starting the nasal saline rinses once daily. Continue loratadine (Claritin) once daily for now.  You could try stopping this in about a month to see if you have recurrent sinus and wheezing symptoms Continue fluticasone nasal spray, 2 sprays each nostril once daily for now.  As with the Claritin he could experiment with stopping this to see if you miss it. Please call our office and follow-up with Dr. Lamonte Sakai if you have any problems or recurrent symptoms so that we can troublesho

## 2019-07-22 NOTE — Progress Notes (Signed)
Subjective:    Patient ID: Jackie Lewis, female    DOB: [birth year], 64 y.o.   MRN: [id number]  HPI 64 year old never smoker with a history of hypothyroidism, allergic rhinitis, GERD. No history of asthma.   She is referred today for evaluation of dyspnea, wheezing. She hears a noise in her throat, happens on expiration, seems to be associated with amount of congestion and sinus congestion. Seems to be worst in the pm, when she is paying down to sleep. She was given albuterol but has not tried. She is more SOB with ambulation and exertion. Feels some SOB with climbing stairs. She has gained about 10 lbs over the last 2 years. Minimal cough, no real sputum. No real GERD sx. Tried loratadine once.   ROV 07/22/19 --follow-up visit for history of wheezing, probably stridor based on her description.  She is a never smoker.  We started her on loratadine, nasal saline rinses and fluticasone nasal spray.  She returns today reporting that she is much improved - much less wheeze / stridor. She never started the [place] because she didn't have the packets.    Review of Systems As per HPI   Past Medical History:  Diagnosis Date  . Abnormal thyroid blood test   . Allergic rhinitis   . Allergy   . Anxiety   . Carpal tunnel    Bil  . GERD (gastroesophageal reflux disease)   . Headache(784.0)   . Hearing loss    Bil/ worse on left side.  . Recurrent UTI   . Scleritis    hx  . Thyroid disease      Family History  Problem Relation Age of Onset  . Stroke Father   . Parkinsonism Father   . Other Son        traumatic brain injury 2011  . Thyroid disease Other   . Hypertension Other   . Hyperlipidemia Other   . Uterine cancer Sister   . Colon cancer Paternal Aunt   . Breast cancer Paternal Aunt   . Heart disease Sister   . Hemolytic uremic syndrome Daughter        atypical  under rx   . Breast cancer Cousin     Children have asthma.   Social History   Socioeconomic History  . Marital  status: Married    Spouse name: Not on file  . Number of children: Not on file  . Years of education: Not on file  . Highest education level: Not on file  Occupational History  . Not on file  Tobacco Use  . Smoking status: Never Smoker  . Smokeless tobacco: Never Used  Substance and Sexual Activity  . Alcohol use: No    Alcohol/week: 0.0 standard drinks  . Drug use: No  . Sexual activity: Not on file  Other Topics Concern  . 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    [place] country of origin   Bereaved parent   Son  speaking some  Walks feeds sabnd dressed self  Can go to gym for som exercise      Hh  of 2 . Daughter graduated from [employer]  grad school in [place] living in [place]  CONN   Daughter   In [place]   Social Determinants of Health   Financial Resource Strain:   . Difficulty of Paying Living Expenses:   Food Insecurity:   . Worried About Charity fundraiser in the Last Year:   . Arboriculturist in the Last Year:   Transportation Needs:   . Film/video editor (Medical):   Marland Kitchen Lack of Transportation (Non-Medical):   Physical Activity:   . Days of Exercise per Week:   . Minutes of Exercise per Session:   Stress:   . Feeling of Stress :   Social Connections:   . Frequency of Communication with Friends and Family:   . Frequency of Social Gatherings with Friends and Family:   . Attends Religious Services:   . Active Member of Clubs or Organizations:   . Attends Archivist Meetings:   Marland Kitchen Marital Status:   Intimate Partner Violence:   . Fear of Current or Ex-Partner:   . Emotionally Abused:   Marland Kitchen Physically Abused:   . 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  No  real inhaled exposures.   Allergies  Allergen Reactions  . Epinephrine     REACTION: dental injection got tachycardia  . Nitrofurantoin Monohyd Macro Rash     Outpatient Medications Prior to Visit  Medication Sig Dispense Refill  . albuterol (VENTOLIN HFA) 108 (90 Base) MCG/ACT inhaler Inhale 1-2 puffs into the lungs every 6 (six) hours as needed for wheezing or shortness of breath. 18 g 1  . calcium carbonate (OS-CAL) 1250 (500 Ca) MG chewable tablet Chew 2 tablets by mouth daily.    . Cholecalciferol (VITAMIN D3) 1000 units CAPS Take 1 capsule by mouth daily.    . fluticasone (FLONASE) 50 MCG/ACT nasal spray Place 2 sprays into both nostrils daily. 16 g 2  . ibuprofen (ADVIL,MOTRIN) 400 MG tablet Take by mouth. USING PRN FOR KNEE PAIN    . levothyroxine (SYNTHROID) 88 MCG tablet Take 1 tablet (88 mcg total) by mouth daily before breakfast. 90 tablet 0   Facility-Administered Medications Prior to Visit  Medication Dose Route Frequency Provider Last Rate Last Admin  . 0.9 %  sodium chloride infusion  500 mL Intravenous Continuous Pyrtle, Lajuan Lines, MD            Objective:   Physical Exam Vitals:   07/22/19 1224  BP: 118/68  Pulse: 96  Temp: 97.7 F (36.5 C)  TempSrc: Oral  SpO2: 97%  Weight: 210 lb 6.4 oz (95.4 kg)  Height: 5\' 5"  (1.651 m)   Gen: Pleasant, obese woman, in no distress,  normal affect  ENT: No lesions,  mouth clear,  oropharynx clear, no postnasal drip  Neck: No JVD, no stridor   Lungs: No use of accessory muscles, no crackles or wheezing on normal respiration, no wheeze on forced expiration  Cardiovascular: RRR, heart sounds normal, no murmur or gallops, no peripheral edema  Musculoskeletal: No deformities, no cyanosis or clubbing  Neuro: alert, awake, non focal  Skin: Warm, no lesions or rash      Assessment & Plan:  Wheeze Upper airway noise, stridor principally due to rhinitis.  Much better controlled now.  We will get her on [place] and see if  she is able to come off either Claritin, fluticasone nasal spray or both.  Follow-up  as needed.  Try starting the nasal saline rinses once daily. Continue loratadine (Claritin) once daily for now.  You could try stopping this in about a month to see if you have recurrent sinus and wheezing symptoms Continue fluticasone nasal spray, 2 sprays each nostril once daily for now.  As with the Claritin he could experiment with stopping this to see if you miss it. Please call our office and follow-up with Dr. Lamonte Sakai if you have any problems or recurrent symptoms so that we can troublesho  Baltazar Apo, MD, PhD 07/22/2019, 12:39 PM [facility] [phone] or if no answer [phone]

## 2019-09-02 ENCOUNTER — Other Ambulatory Visit: Payer: Self-pay

## 2019-09-02 ENCOUNTER — Ambulatory Visit (INDEPENDENT_AMBULATORY_CARE_PROVIDER_SITE_OTHER): Payer: BC Managed Care – PPO | Admitting: Internal Medicine

## 2019-09-02 ENCOUNTER — Encounter: Payer: Self-pay | Admitting: Internal Medicine

## 2019-09-02 VITALS — BP 122/84 | HR 77 | Temp 98.0°F | Ht 61.1 in | Wt 207.2 lb

## 2019-09-02 DIAGNOSIS — Z79899 Other long term (current) drug therapy: Secondary | ICD-10-CM

## 2019-09-02 DIAGNOSIS — E039 Hypothyroidism, unspecified: Secondary | ICD-10-CM | POA: Diagnosis not present

## 2019-09-02 DIAGNOSIS — R200 Anesthesia of skin: Secondary | ICD-10-CM

## 2019-09-02 DIAGNOSIS — Z0000 Encounter for general adult medical examination without abnormal findings: Secondary | ICD-10-CM | POA: Diagnosis not present

## 2019-09-02 DIAGNOSIS — R0683 Snoring: Secondary | ICD-10-CM

## 2019-09-02 DIAGNOSIS — G479 Sleep disorder, unspecified: Secondary | ICD-10-CM

## 2019-09-02 MED ORDER — LEVOTHYROXINE SODIUM 88 MCG PO TABS: 88.0000 ug | ORAL_TABLET | Freq: Every day | ORAL | 2 refills | Status: DC

## 2019-09-02 MED ORDER — TRIAMCINOLONE ACETONIDE 0.025 % EX CREA: TOPICAL_CREAM | CUTANEOUS | 1 refills | Status: DC

## 2019-09-02 NOTE — Patient Instructions (Addendum)
Will do referral for sleep possible sleep apnea evaluation. Will notify you  of labs when available.   Will refill meds .  Left finger numbness is probably positional compression nerve and not  Heart or  Vascular .  If  persistent or progressive may consider seeing a hand specialist. But for now avoid extreme flexion etc at elbow and wrist .   consider prevnar 13 and then pneumovax 23  In future       Preventive Care 65 Years and Older, Female Preventive care refers to lifestyle choices and visits with your health care provider that can promote health and wellness. This includes:  A yearly physical exam. This is also called an annual well check.  Regular dental and eye exams.  Immunizations.  Screening for certain conditions.  Healthy lifestyle choices, such as diet and exercise. What can I expect for my preventive care visit? Physical exam Your health care provider will check:  Height and weight. These may be used to calculate body mass index (BMI), which is a measurement that tells if you are at a healthy weight.  Heart rate and blood pressure.  Your skin for abnormal spots. Counseling Your health care provider may ask you questions about:  Alcohol, tobacco, and drug use.  Emotional well-being.  Home and relationship well-being.  Sexual activity.  Eating habits.  History of falls.  Memory and ability to understand (cognition).  Work and work Statistician.  Pregnancy and menstrual history. What immunizations do I need?  Influenza (flu) vaccine  This is recommended every year. Tetanus, diphtheria, and pertussis (Tdap) vaccine  You may need a Td booster every 10 years. Varicella (chickenpox) vaccine  You may need this vaccine if you have not already been vaccinated. Zoster (shingles) vaccine  You may need this after age 22. Pneumococcal conjugate (PCV13) vaccine  One dose is recommended after age 50. Pneumococcal polysaccharide (PPSV23)  vaccine  One dose is recommended after age 6. Measles, mumps, and rubella (MMR) vaccine  You may need at least one dose of MMR if you were born in 1957 or later. You may also need a second dose. Meningococcal conjugate (MenACWY) vaccine  You may need this if you have certain conditions. Hepatitis A vaccine  You may need this if you have certain conditions or if you travel or work in places where you may be exposed to hepatitis A. Hepatitis B vaccine  You may need this if you have certain conditions or if you travel or work in places where you may be exposed to hepatitis B. Haemophilus influenzae type b (Hib) vaccine  You may need this if you have certain conditions. You may receive vaccines as individual doses or as more than one vaccine together in one shot (combination vaccines). Talk with your health care provider about the risks and benefits of combination vaccines. What tests do I need? Blood tests  Lipid and cholesterol levels. These may be checked every 5 years, or more frequently depending on your overall health.  Hepatitis C test.  Hepatitis B test. Screening  Lung cancer screening. You may have this screening every year starting at age 55 if you have a 30-pack-year history of smoking and currently smoke or have quit within the past 15 years.  Colorectal cancer screening. All adults should have this screening starting at age 68 and continuing until age 46. Your health care provider may recommend screening at age 96 if you are at increased risk. You will have tests every 1-10 years, depending  on your results and the type of screening test.  Diabetes screening. This is done by checking your blood sugar (glucose) after you have not eaten for a while (fasting). You may have this done every 1-3 years.  Mammogram. This may be done every 1-2 years. Talk with your health care provider about how often you should have regular mammograms.  BRCA-related cancer screening. This may  be done if you have a family history of breast, ovarian, tubal, or peritoneal cancers. Other tests  Sexually transmitted disease (STD) testing.  Bone density scan. This is done to screen for osteoporosis. You may have this done starting at age 74. Follow these instructions at home: Eating and drinking  Eat a diet that includes fresh fruits and vegetables, whole grains, lean protein, and low-fat dairy products. Limit your intake of foods with high amounts of sugar, saturated fats, and salt.  Take vitamin and mineral supplements as recommended by your health care provider.  Do not drink alcohol if your health care provider tells you not to drink.  If you drink alcohol: ? Limit how much you have to 0-1 drink a day. ? Be aware of how much alcohol is in your drink. In the [place] one drink equals one 12 oz bottle of beer (355 mL), one 5 oz glass of wine (148 mL), or one 1 oz glass of hard liquor (44 mL). Lifestyle  Take daily care of your teeth and gums.  Stay active. Exercise for at least 30 minutes on 5 or more days each week.  Do not use any products that contain nicotine or tobacco, such as cigarettes, e-cigarettes, and chewing tobacco. If you need help quitting, ask your health care provider.  If you are sexually active, practice safe sex. Use a condom or other form of protection in order to prevent STIs (sexually transmitted infections).  Talk with your health care provider about taking a low-dose aspirin or statin. What's next?  Go to your health care provider once a year for a well check visit.  Ask your health care provider how often you should have your eyes and teeth checked.  Stay up to date on all vaccines. This information is not intended to replace advice given to you by your health care provider. Make sure you discuss any questions you have with your health care provider. Document Revised: 01/25/2018 Document Reviewed: 01/25/2018 Elsevier Patient Education  2020  [employer].

## 2019-09-02 NOTE — Progress Notes (Signed)
Chief Complaint  Patient presents with  . Annual Exam    Doing okay  . Numbness    left hand is worse, in fingers, pain during the night, going on for a couple of months    HPI: Patient  Jackie Lewis  64 y.o. comes in today for Preventive Health Care visit   To get [facility] appt    For eye.  Breathing is ok no wheezing   At this time   No medication saw se [facility] last year and now ok  Thyroid taking every day  Left fingers get numb at times recnetly feels weh lifting cup and sleep in position no weakness assoc sx cp sob  Sleep always off interrupted has lots of mucou snores but also   Has kept light when sons life threatening  Injuries also  husband to be away for a month  Please refill tmc 0.025 % uses ocass itchy rashes    Health Maintenance  Topic Date Due  . HIV Screening  Never done  . PNA vac Low Risk Adult (1 of 2 - PCV13) Never done  . INFLUENZA VACCINE  09/15/2019  . PAP SMEAR-Modifier  11/30/2019  . MAMMOGRAM  01/28/2021  . COLONOSCOPY  02/01/2021  . TETANUS/TDAP  06/14/2025  . DEXA SCAN  Completed  . COVID-19 Vaccine  Completed  . Hepatitis C Screening  Completed   Health Maintenance Review LIFESTYLE:  Exercise:   No sedentary care for son etc  Tobacco/ETS:n Alcohol: n Sugar beverages:n Sleep:4 hours interrupted sometimes more 6+  Drug use: no HH of 3  ROS:  See above  GEN/ HEENT: No fever, significant weight changes sweats headaches vision problems hearing changes, CV/ PULM; No chest pain shortness of breath cough, syncope,edema  change in exercise tolerance. GI /GU: No adominal pain, vomiting, change in bowel habits. No blood in the stool. No significant GU symptoms. SKIN/HEME: ,no acute skin rashes suspicious lesions or bleeding. No lymphadenopathy, nodules, masses.  NEURO/ PSYCH:  .no new   But reactive  depression anxiety.  Many losses grief and responsibility  IMM/ Allergy: No unusual infections.  Allergy .   REST of 12 system review negative except as  per HPI   Past Medical History:  Diagnosis Date  . Abnormal thyroid blood test   . Allergic rhinitis   . Allergy   . Anxiety   . Carpal tunnel    Bil  . GERD (gastroesophageal reflux disease)   . Headache(784.0)   . Hearing loss    Bil/ worse on left side.  . Recurrent UTI   . Scleritis    hx  . Thyroid disease     Past Surgical History:  Procedure Laterality Date  . ESOPHAGOGASTRODUODENOSCOPY  3/09  . EXCISION RADIAL HEAD  2015   right elbow  . INNER EAR SURGERY     hole in right ear/ surg left ear 2-3 times  . UPPER GASTROINTESTINAL ENDOSCOPY      Family History  Problem Relation Age of Onset  . Stroke Father   . Parkinsonism Father   . Other Son        traumatic brain injury 2011  . Thyroid disease Other   . Hypertension Other   . Hyperlipidemia Other   . Uterine cancer Sister   . Colon cancer Paternal Aunt   . Breast cancer Paternal Aunt   . Heart disease Sister   . Hemolytic uremic syndrome Daughter        atypical  under  rx   . Breast cancer Cousin     Social History   Socioeconomic History  . Marital status: Married    Spouse name: Not on file  . Number of children: Not on file  . Years of education: Not on file  . Highest education level: Not on file  Occupational History  . Not on file  Tobacco Use  . Smoking status: Never Smoker  . Smokeless tobacco: Never Used  Vaping Use  . Vaping Use: Never used  Substance and Sexual Activity  . Alcohol use: No    Alcohol/week: 0.0 standard drinks  . Drug use: No  . Sexual activity: Not on file  Other Topics Concern  . 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    [place] country of origin   Bereaved parent   Son  speaking some  Walks feeds sabnd dressed self  Can go to gym for som exercise      Hh  of 2 .  Daughter graduated from [employer]  grad school in [place] living in [place]   Daughter   In [place]   Social Determinants of Health   Financial Resource Strain:   . Difficulty of Paying Living Expenses:   Food Insecurity:   . Worried About Charity fundraiser in the Last Year:   . Arboriculturist in the Last Year:   Transportation Needs:   . Film/video editor (Medical):   Marland Kitchen Lack of Transportation (Non-Medical):   Physical Activity:   . Days of Exercise per Week:   . Minutes of Exercise per Session:   Stress:   . Feeling of Stress :   Social Connections:   . Frequency of Communication with Friends and Family:   . Frequency of Social Gatherings with Friends and Family:   . Attends Religious Services:   . Active Member of Clubs or Organizations:   . Attends Archivist Meetings:   Marland Kitchen Marital Status:     Outpatient Medications Prior to Visit  Medication Sig Dispense Refill  . albuterol (VENTOLIN HFA) 108 (90 Base) MCG/ACT inhaler Inhale 1-2 puffs into the lungs every 6 (six) hours as needed for wheezing or shortness of breath. 18 g 1  . calcium carbonate (OS-CAL) 1250 (500 Ca) MG chewable tablet Chew 2 tablets by mouth daily.    . Cholecalciferol (VITAMIN D3) 1000 units CAPS Take 1 capsule by mouth daily.    . fluticasone (FLONASE) 50 MCG/ACT nasal spray Place 2 sprays into both nostrils daily. 16 g 2  . ibuprofen (ADVIL,MOTRIN) 400 MG tablet Take by mouth. USING PRN FOR KNEE PAIN    . levothyroxine (SYNTHROID) 88 MCG tablet Take 1 tablet (88 mcg total) by mouth daily before breakfast. 90 tablet 0  . SODIUM FLUORIDE 5000 SENSITIVE 1.1-5 % PSTE Take by mouth as directed. (Patient not taking: Reported on 09/02/2019)     Facility-Administered Medications Prior to Visit  Medication Dose Route Frequency Provider Last Rate Last Admin  . 0.9 %  sodium chloride infusion  500 mL Intravenous Continuous Pyrtle, Lajuan Lines, MD         EXAM:  BP 122/84   Pulse  77   Temp 98 F (36.7 C) (Oral)   Ht 5' 1.1" (1.552 m)   Wt 207 lb 3.2 oz (94 kg)   SpO2 96%   BMI 39.02 kg/m   Body mass index is 39.02 kg/m. Wt Readings from Last 3 Encounters:  09/02/19 207 lb 3.2 oz (94 kg)  07/22/19 210 lb 6.4 oz (95.4 kg)  06/14/19 213 lb 6.4 oz (96.8 kg)    Physical Exam: Vital signs reviewed IRS:WNIO is a well-developed well-nourished alert cooperative    who appearsr stated age in no acute distress.  HEENT: normocephalic atraumatic , Eyes: PERRL EOM's full, conjunctiva clear, Nares: paten,t no deformity discharge or tenderness., Ears: no deformity EAC's clear TMs with normal landmark ssome wax  . Mouth:masked NECK: supple without masses, thyromegaly or bruits. CHEST/PULM:  Clear to auscultation and percussion breath sounds equal no wheeze , rales or rhonchi. No chest wall deformities or tenderness. Breast: normal by inspection . No dimpling, discharge, masses, tenderness or discharge . CV: PMI is nondisplaced, S1 S2 no gallops, murmurs, rubs. Peripheral pulses are full without delay.No JVD .  ABDOMEN: Bowel sounds normal nontender  No guard or rebound, no hepato splenomegal no CVA tenderness.  No hernia. Extremtities:  No clubbing cyanosis or edema, no acute joint swelling or redness no focal atrophy r elbow limited extension  NEURO:  Oriented x3, cranial nerves 3-12 appear to be intact, no obvious focal weakness,gait within normal limits no abnormal reflexes or asymmetrical left hand nl grip no atrophy pulses nl  SKIN: No acute rashes normal turgor, color, no bruising or petechiae. PSYCH: Oriented, good eye contact, no obvious depression anxiety, cognition and judgment appear normal. LN: no cervical axillary inguinal adenopathy  Lab Results  Component Value Date   WBC 5.6 08/31/2018   HGB 12.1 08/31/2018   HCT 37.1 08/31/2018   PLT 211.0 08/31/2018   GLUCOSE 90 08/31/2018   CHOL 192 08/31/2018   TRIG 102.0 08/31/2018   HDL 66.00 08/31/2018    LDLDIRECT 113.9 06/22/2010   LDLCALC 106 (H) 08/31/2018   ALT 19 08/31/2018   AST 17 08/31/2018   NA 142 08/31/2018   K 4.2 08/31/2018   CL 104 08/31/2018   CREATININE 0.75 08/31/2018   BUN 16 08/31/2018   CO2 28 08/31/2018   TSH 2.83 08/31/2018   HGBA1C 5.5 08/31/2018   MICROALBUR 0.7 04/14/2006    BP Readings from Last 3 Encounters:  09/02/19 122/84  07/22/19 118/68  06/14/19 130/78    Lab plan  reviewed with patient   ASSESSMENT AND PLAN:  Discussed the following assessment and plan:    ICD-10-CM   1. Visit for preventive health examination  Z00.00 CBC with Differential/Platelet    Hepatic function panel    Lipid panel    TSH    Hemoglobin E7O    BASIC METABOLIC PANEL WITH GFR    BASIC METABOLIC PANEL WITH GFR    Hemoglobin A1c    TSH    Lipid panel    Hepatic function panel    CBC with Differential/Platelet  2. Medication management  Z79.899 CBC with Differential/Platelet    Hepatic function panel    Lipid panel    TSH    Hemoglobin J5K    BASIC METABOLIC PANEL WITH GFR    BASIC METABOLIC PANEL WITH GFR    Hemoglobin A1c    TSH    Lipid panel    Hepatic function panel    CBC with Differential/Platelet  3. Hypothyroidism,  unspecified type  E03.9 CBC with Differential/Platelet    Hepatic function panel    Lipid panel    TSH    Hemoglobin K8L    BASIC METABOLIC PANEL WITH GFR    BASIC METABOLIC PANEL WITH GFR    Hemoglobin A1c    TSH    Lipid panel    Hepatic function panel    CBC with Differential/Platelet  4. Disordered sleep  G47.9 CBC with Differential/Platelet    Hepatic function panel    Lipid panel    TSH    Hemoglobin E7N    BASIC METABOLIC PANEL WITH GFR    BASIC METABOLIC PANEL WITH GFR    Hemoglobin A1c    TSH    Lipid panel    Hepatic function panel    CBC with Differential/Platelet    Ambulatory referral to Pulmonology   snores and mucous also poss habit light sleep but could have sig OSA  plan referral for eval   5. Finger  numbness  R20.0 CBC with Differential/Platelet    Hepatic function panel    Lipid panel    TSH    Hemoglobin T7G    BASIC METABOLIC PANEL WITH GFR    BASIC METABOLIC PANEL WITH GFR    Hemoglobin A1c    TSH    Lipid panel    Hepatic function panel    CBC with Differential/Platelet  6. Snoring  R06.83 Ambulatory referral to Pulmonology   Return for depending on results 1 year   cpx . Wants to wait on  immuniz today  prevnar and pneumovax  Consider   Positional  Numbness  consider cts or at elbow   Patient Instructions  Will do referral for sleep possible sleep apnea evaluation. Will notify you  of labs when available.   Will refill meds .  Left finger numbness is probably positional compression nerve and not  Heart or  Vascular .  If  persistent or progressive may consider seeing a hand specialist. But for now avoid extreme flexion etc at elbow and wrist .   consider prevnar 13 and then pneumovax 23  In future       Preventive Care 65 Years and Older, Female Preventive care refers to lifestyle choices and visits with your health care provider that can promote health and wellness. This includes:  A yearly physical exam. This is also called an annual well check.  Regular dental and eye exams.  Immunizations.  Screening for certain conditions.  Healthy lifestyle choices, such as diet and exercise. What can I expect for my preventive care visit? Physical exam Your health care provider will check:  Height and weight. These may be used to calculate body mass index (BMI), which is a measurement that tells if you are at a healthy weight.  Heart rate and blood pressure.  Your skin for abnormal spots. Counseling Your health care provider may ask you questions about:  Alcohol, tobacco, and drug use.  Emotional well-being.  Home and relationship well-being.  Sexual activity.  Eating habits.  History of falls.  Memory and ability to understand (cognition).  Work  and work Statistician.  Pregnancy and menstrual history. What immunizations do I need?  Influenza (flu) vaccine  This is recommended every year. Tetanus, diphtheria, and pertussis (Tdap) vaccine  You may need a Td booster every 10 years. Varicella (chickenpox) vaccine  You may need this vaccine if you have not already been vaccinated. Zoster (shingles) vaccine  You may need this after  age 101. Pneumococcal conjugate (PCV13) vaccine  One dose is recommended after age 57. Pneumococcal polysaccharide (PPSV23) vaccine  One dose is recommended after age 6. Measles, mumps, and rubella (MMR) vaccine  You may need at least one dose of MMR if you were born in 1957 or later. You may also need a second dose. Meningococcal conjugate (MenACWY) vaccine  You may need this if you have certain conditions. Hepatitis A vaccine  You may need this if you have certain conditions or if you travel or work in places where you may be exposed to hepatitis A. Hepatitis B vaccine  You may need this if you have certain conditions or if you travel or work in places where you may be exposed to hepatitis B. Haemophilus influenzae type b (Hib) vaccine  You may need this if you have certain conditions. You may receive vaccines as individual doses or as more than one vaccine together in one shot (combination vaccines). Talk with your health care provider about the risks and benefits of combination vaccines. What tests do I need? Blood tests  Lipid and cholesterol levels. These may be checked every 5 years, or more frequently depending on your overall health.  Hepatitis C test.  Hepatitis B test. Screening  Lung cancer screening. You may have this screening every year starting at age 45 if you have a 30-pack-year history of smoking and currently smoke or have quit within the past 15 years.  Colorectal cancer screening. All adults should have this screening starting at age 61 and continuing until age 80.  Your health care provider may recommend screening at age 40 if you are at increased risk. You will have tests every 1-10 years, depending on your results and the type of screening test.  Diabetes screening. This is done by checking your blood sugar (glucose) after you have not eaten for a while (fasting). You may have this done every 1-3 years.  Mammogram. This may be done every 1-2 years. Talk with your health care provider about how often you should have regular mammograms.  BRCA-related cancer screening. This may be done if you have a family history of breast, ovarian, tubal, or peritoneal cancers. Other tests  Sexually transmitted disease (STD) testing.  Bone density scan. This is done to screen for osteoporosis. You may have this done starting at age 31. Follow these instructions at home: Eating and drinking  Eat a diet that includes fresh fruits and vegetables, whole grains, lean protein, and low-fat dairy products. Limit your intake of foods with high amounts of sugar, saturated fats, and salt.  Take vitamin and mineral supplements as recommended by your health care provider.  Do not drink alcohol if your health care provider tells you not to drink.  If you drink alcohol: ? Limit how much you have to 0-1 drink a day. ? Be aware of how much alcohol is in your drink. In the [place] one drink equals one 12 oz bottle of beer (355 mL), one 5 oz glass of wine (148 mL), or one 1 oz glass of hard liquor (44 mL). Lifestyle  Take daily care of your teeth and gums.  Stay active. Exercise for at least 30 minutes on 5 or more days each week.  Do not use any products that contain nicotine or tobacco, such as cigarettes, e-cigarettes, and chewing tobacco. If you need help quitting, ask your health care provider.  If you are sexually active, practice safe sex. Use a condom or other form of protection  in order to prevent STIs (sexually transmitted infections).  Talk with your health care  provider about taking a low-dose aspirin or statin. What's next?  Go to your health care provider once a year for a well check visit.  Ask your health care provider how often you should have your eyes and teeth checked.  Stay up to date on all vaccines. This information is not intended to replace advice given to you by your health care provider. Make sure you discuss any questions you have with your health care provider. Document Revised: 01/25/2018 Document Reviewed: 01/25/2018 Elsevier Patient Education  2020 [place]affee Keyton Bhat M.D.

## 2019-09-03 LAB — CBC WITH DIFFERENTIAL/PLATELET
Absolute Monocytes: 408 cells/uL (ref 200–950)
Basophils Absolute: 41 cells/uL (ref 0–200)
Basophils Relative: 0.6 %
Eosinophils Absolute: 184 cells/uL (ref 15–500)
Eosinophils Relative: 2.7 %
HCT: 40.6 % (ref 35.0–45.0)
Hemoglobin: 12.9 g/dL (ref 11.7–15.5)
Lymphs Abs: 2353 cells/uL (ref 850–3900)
MCH: 26.7 pg — ABNORMAL LOW (ref 27.0–33.0)
MCHC: 31.8 g/dL — ABNORMAL LOW (ref 32.0–36.0)
MCV: 83.9 fL (ref 80.0–100.0)
MPV: 11.7 fL (ref 7.5–12.5)
Monocytes Relative: 6 %
Neutro Abs: 3815 cells/uL (ref 1500–7800)
Neutrophils Relative %: 56.1 %
Platelets: 227 10*3/uL (ref 140–400)
RBC: 4.84 10*6/uL (ref 3.80–5.10)
RDW: 13.7 % (ref 11.0–15.0)
Total Lymphocyte: 34.6 %
WBC: 6.8 10*3/uL (ref 3.8–10.8)

## 2019-09-03 LAB — LIPID PANEL
Cholesterol: 200 mg/dL — ABNORMAL HIGH (ref <200)
HDL: 70 mg/dL (ref >=50)
LDL Cholesterol (Calc): 107 mg/dL (calc) — ABNORMAL HIGH
Non-HDL Cholesterol (Calc): 130 mg/dL (calc) — ABNORMAL HIGH (ref <130)
Total CHOL/HDL Ratio: 2.9 (calc) (ref <5.0)
Triglycerides: 118 mg/dL (ref <150)

## 2019-09-03 LAB — TSH: TSH: 3.58 mIU/L (ref 0.40–4.50)

## 2019-09-03 LAB — HEPATIC FUNCTION PANEL
AG Ratio: 1.3 (calc) (ref 1.0–2.5)
ALT: 24 U/L (ref 6–29)
AST: 18 U/L (ref 10–35)
Albumin: 4.3 g/dL (ref 3.6–5.1)
Alkaline phosphatase (APISO): 78 U/L (ref 37–153)
Bilirubin, Direct: 0.1 mg/dL (ref 0.0–0.2)
Globulin: 3.3 g/dL (calc) (ref 1.9–3.7)
Indirect Bilirubin: 0.4 mg/dL (calc) (ref 0.2–1.2)
Total Bilirubin: 0.5 mg/dL (ref 0.2–1.2)
Total Protein: 7.6 g/dL (ref 6.1–8.1)

## 2019-09-03 LAB — BASIC METABOLIC PANEL WITH GFR
BUN: 17 mg/dL (ref 7–25)
CO2: 29 mmol/L (ref 20–32)
Calcium: 10 mg/dL (ref 8.6–10.4)
Chloride: 102 mmol/L (ref 98–110)
Creat: 0.69 mg/dL (ref 0.50–0.99)
GFR, Est African American: 106 mL/min/{1.73_m2} (ref >=60)
GFR, Est Non African American: 91 mL/min/{1.73_m2} (ref >=60)
Glucose, Bld: 91 mg/dL (ref 65–99)
Potassium: 4.3 mmol/L (ref 3.5–5.3)
Sodium: 140 mmol/L (ref 135–146)

## 2019-09-03 LAB — HEMOGLOBIN A1C
Hgb A1c MFr Bld: 5.4 % of total Hgb (ref <5.7)
Mean Plasma Glucose: 108 (calc)
eAG (mmol/L): 6 (calc)

## 2019-09-04 NOTE — Progress Notes (Signed)
Blood sugar normal Thyroid in range  Cholesterol  favorable , other out of range results not clinically significant  The 10-year ASCVD risk score Jackie Lewis., et al., 2013) is: 4.6%   Values used to calculate the score:     Age: 64 years     Sex: Female     Is Non-Hispanic African American: No     Diabetic: No     Tobacco smoker: No     Systolic Blood Pressure: 546 mmHg     Is BP treated: No     HDL Cholesterol: 70 mg/dL     Total Cholesterol: 200 mg/dL

## 2019-09-10 ENCOUNTER — Other Ambulatory Visit: Payer: Self-pay | Admitting: Emergency Medicine

## 2019-11-18 ENCOUNTER — Institutional Professional Consult (permissible substitution): Payer: BC Managed Care – PPO | Admitting: Pulmonary Disease

## 2019-12-14 ENCOUNTER — Ambulatory Visit: Payer: BC Managed Care – PPO | Attending: Internal Medicine

## 2019-12-14 DIAGNOSIS — Z23 Encounter for immunization: Secondary | ICD-10-CM

## 2019-12-14 NOTE — Progress Notes (Signed)
   [facility] Vaccination [facility]  Name:  Jackie Lewis    MRN: [id number] DOB: [birth year]  12/14/2019  [name] was observed post Covid-19 immunization for 15 minutes without incident. She was provided with Vaccine Information Sheet and instruction to access the V-Safe system.   [name] was instructed to call 911 with any severe reactions post vaccine: Marland Kitchen Difficulty breathing  . Swelling of face and throat  . A fast heartbeat  . A bad rash all over body  . Dizziness and weakness

## 2020-02-17 NOTE — Telephone Encounter (Signed)
So there is no one  treatment recommended but usually 3 or 4 drugs. And can be complicated by local bacterial resistance  amox clarythromycin ,  Bismuth tetracycline metronidazole  And a acid blocker such as prilosec   Pylera and Helidac  Are 2 prepackaged  meds to take with  Acid blockers    I will try to copy and paste recommendations Sometimes it is depends on what drug resistance there is any given location of what antibiotic to use They may need to ask the local medical people  What is the standard treatment   This is from [email or web address]   Treatment regimens for Helicobacter pylori in adults   SUMMARY AND RECOMMENDATIONS ?All patients with evidence of active infection with H. pylori should be offered treatment. The choice of initial antibiotic regimen to treat H. pylori should be guided by the presence of risk factors for macrolide resistance and the presence of a penicillin allergy. In patients with risk factors for macrolide resistance, clarithromycin-based therapy should be avoided (algorithm 1 and table 1). (See 'Approach to selecting an antibiotic regimen' above.) Risk factors for macrolide resistance include: .Prior exposure to macrolide therapy for any reason. .High local clarithromycin resistance rates ?15 percent or eradication rates with clarithromycin-based triple therapy ?85 percent. In the [place], given the limited information on antimicrobial resistance rates, we generally assume clarithromycin resistance rates are greater than 15 percent unless local resistance data indicate otherwise.  ?For initial therapy in patients without risk factors for macrolide resistance, we suggest triple therapy with a proton pump inhibitor (PPI), amoxicillin (1 g twice daily), and clarithromycin (500 mg twice daily) for 14 days (Grade 2B). We suggest substitution of amoxicillin with metronidazole only in penicillin-allergic individuals since metronidazole resistance is common and can reduce  the efficacy of treatment (Grade 2B). (See 'Clarithromycin-based therapy' above.) ?We suggest bismuth quadruple therapy as initial treatment in patients with risk factors for macrolide resistance (algorithm 1 and table 1). Quadruple therapy consists of a PPI, bismuth subsalicylate, and two antibiotics (metronidazole and tetracycline) given four times daily for 14 days. Alternatively, a commercially available combination capsule containing bismuth subsalicylate, metronidazole, and tetracycline may be used in conjunction with a PPI. (See 'Salvage therapy for persistent H. pylori infection' above and 'Bismuth quadruple therapy' above.)  ?Tests to confirm eradication should be performed in all patients treated for H. pylori. Eradication may be confirmed by a urea breath test, fecal antigen test, or upper endoscopy performed four weeks or more after completion of antibiotic therapy. PPI therapy should be withheld for one to two weeks prior to testing. (See 'Confirmation of eradication' above and "Indications and diagnostic tests for Helicobacter pylori infection in adults", section on 'Diagnostic tests'.) ?In patients with persistent H. pylori infection, the choice of antibiotic therapy should be guided by the patient's initial treatment regimen and the presence of relevant antibiotic allergies (algorithm 2 and table 2). For patients failing a course of H. pylori treatment, we suggest an alternate regimen using a different combination of medications (Grade 2B). In general, clarithromycin and antibiotics used previously should be avoided if possible. (See 'Salvage therapy for persistent H. pylori infection' above.) ?Culture with antibiotic sensitivity testing should be performed to guide antibiotic treatment in patients who have failed two prior treatment regimens. Compliance with medications should also be reinforced. We reserve the use of rifabutin-containing regimens for patients with ?3 prior antibiotic failures.  (See 'Salvage therapy for persistent H. pylori infection' above.) ACKNOWLEDGMENTThe UpToDate editorial staff acknowledges Golda Acre,  MD, FRCPC, FACP, FACG, AGAF who contributed to an earlier version of this topic review. Use of UpToDate is subject to the Subscription and License Agreement.  Graphics in this topic  First-line therapies for H. pylori infection  Regimen Drugs (doses) Dosing frequency Duration (days) FDA approval  Bismuth quadruple PPI (standard dose) Twice daily 10 to 14? No   Bismuth subcitrate (120 to 300 mg [not available in [place]] or 420 mg [available in [place] and elsewhere as part of Pylera combination pill])[1] or Bismuth subsalicylate (300 or 524 mg)[1] Four times daily     Tetracycline (500 mg) Four times daily     Metronidazole (250 to 500 mg) Four times daily (250 mg)      Three to four times daily (500 mg)    Clarithromycin triple* PPI (standard or double the standard dose) Twice daily 14 Yes?   Clarithromycin (500 mg) Twice daily     Amoxicillin (1 gram) or Metronidazole (500 mg) Twice daily (amoxicillin) Three times daily (metronidazole)    Clarithromycin-based concomitant* PPI (standard dose) Twice daily 10 to 14 No   Clarithromycin (500 mg) Twice daily     Amoxicillin (1 gram) Twice daily     Metronidazole or tinidazole (500 mg) Twice daily    Clarithromycin-based sequential* PPI (standard dose) plus amoxicillin (1 gram) for 5 days followed by: Twice daily 10 (total) No   PPI, clarithromycin (500 mg) plus either metronidazole or tinidazole (500 mg) for an additional 5 days Twice daily    Clarithromycin-based hybrid?* PPI (standard dose) plus amoxicillin (1 gram) for 7 days followed by: Twice daily 14 (total) No   PPI, amoxicillin, clarithromycin (500 mg), plus either metronidazole or tinidazole (500 mg) for an additional 7 days Twice daily    FDA: [employer]; PPI: proton pump inhibitor. * In patients with risk  factors for macrolide resistance, clarithromycin-based therapy should be avoided.  Standard doses of orally administered proton pump inhibitors include: Lansoprazole 30 mg daily, omeprazole 20 mg daily, pantoprazole 40 mg daily, rabeprazole 20 mg daily, or esomeprazole 20 mg daily. ? Several PPI, clarithromycin, and amoxicillin combinations have achieved FDA approval. PPI, clarithromycin, and metronidazole is not an FDA-approved treatment regimen. ? 14 days is recommended. Refer to UpToDate topic on treatment for H. pylori infection.  PPI, bismuth, tetracycline, and metronidazole prescribed separately is not an FDA-approved treatment regimen. However, Pylera (a fixed-dose capsule containing bismuth subcitrate, tetracycline, and metronidazole) combined with a PPI for 10 days, and Helidac (a copackaged product containing bismuth subsalicylate, tetracycline, and metronidazole) combined with a PPI for 14 days, are FDA-approved treatment regimens. For additional information, refer to the Lexicomp drug monographs included within UpToDate.  Some [place] guidelines do not support the use of sequential therapy. ? Hybrid therapy has not been universally endorsed as an option for first-line therapy.  Reference:  1. [place], [place], Zenaida Niece Zanteri et al. The [place] Consensus for Treatment of Helicobacter pylori infection in Adults. Gastro 2016; 15:51. Adapted by permission from [employer]: [employer] of Gastroenter[employer]. Chey WD, Leontiadis GI, [place] CW, [place]. ACG Clinical Guideline: Treatment of Helicobacter pylori Infection. Am J Gastroenterol 2017; 742:595. Copyright  2017. www.nature.com/ajg.  Graphic [phone] Version 9.0   2022 UpToDate, Inc. and/or its affiliates. All Rights Reserved.         Graphics in this topic   First-line therapies for H. pylori infection Regimen Drugs (doses) Dosing frequency Duration (days) FDA approval  Bismuth quadruple PPI (  standard dose)  Twice daily 10 to 14? No   Bismuth subcitrate (120 to 300 mg [not available in [place]] or 420 mg [available in [place] and elsewhere as part of Pylera combination pill])[1] or Bismuth subsalicylate (300 or 524 mg)[1] Four times daily     Tetracycline (500 mg) Four times daily     Metronidazole (250 to 500 mg) Four times daily (250 mg)      Three to four times daily (500 mg)    Clarithromycin triple* PPI (standard or double the standard dose) Twice daily 14 Yes?   Clarithromycin (500 mg) Twice daily     Amoxicillin (1 gram) or Metronidazole (500 mg) Twice daily (amoxicillin) Three times daily (metronidazole)    Clarithromycin-based concomitant* PPI (standard dose) Twice daily 10 to 14 No   Clarithromycin (500 mg) Twice daily     Amoxicillin (1 gram) Twice daily     Metronidazole or tinidazole (500 mg) Twice daily    Clarithromycin-based sequential* PPI (standard dose) plus amoxicillin (1 gram) for 5 days followed by: Twice daily 10 (total) No   PPI, clarithromycin (500 mg) plus either metronidazole or tinidazole (500 mg) for an additional 5 days Twice daily    Clarithromycin-based hybrid?* PPI (standard dose) plus amoxicillin (1 gram) for 7 days followed by: Twice daily 14 (total) No   PPI, amoxicillin, clarithromycin (500 mg), plus either metronidazole or tinidazole (500 mg) for an additional 7 days Twice daily    FDA: [employer]; PPI: proton pump inhibitor. * In patients with risk factors for macrolide resistance, clarithromycin-based therapy should be avoided.  Standard doses of orally administered proton pump inhibitors include: Lansoprazole 30 mg daily, omeprazole 20 mg daily, pantoprazole 40 mg daily, rabeprazole 20 mg daily, or esomeprazole 20 mg daily. ? Several PPI, clarithromycin, and amoxicillin combinations have achieved FDA approval. PPI, clarithromycin, and metronidazole is not an FDA-approved treatment regimen. ? 14 days is recommended.  Refer to UpToDate topic on treatment for H. pylori infection.  PPI, bismuth, tetracycline, and metronidazole prescribed separately is not an FDA-approved treatment regimen. However, Pylera (a fixed-dose capsule containing bismuth subcitrate, tetracycline, and metronidazole) combined with a PPI for 10 days, and Helidac (a copackaged product containing bismuth subsalicylate, tetracycline, and metronidazole) combined with a PPI for 14 days, are FDA-approved treatment regimens. For additional information, refer to the Lexicomp drug monographs included within UpToDate.  Some [place] guidelines do not support the use of sequential therapy. ? Hybrid therapy has not been universally endorsed as an option for first-line therapy. Reference: 1. [place], [place], Zenaida Niece Zanteri et al. The [place] Consensus for Treatment of Helicobacter pylori infection in Adults. Gastro 2016; 15:51. Adapted by permission from [employer]: [employer]. Chey WD, Leontiadis GI, [place] CW, [address]. ACG Clinical Guideline: Treatment of Helicobacter pylori Infection. Am J Gastroenterol 2017; 073:710. Copyright  2017. www.nature.com/ajg. Graphic [phone] Version 9.0  2022 UpToDate, Inc. and/or its affiliates. All Rights Reserved.

## 2020-04-02 ENCOUNTER — Other Ambulatory Visit: Payer: Self-pay | Admitting: Internal Medicine

## 2020-04-02 DIAGNOSIS — Z1231 Encounter for screening mammogram for malignant neoplasm of breast: Secondary | ICD-10-CM

## 2020-05-21 ENCOUNTER — Other Ambulatory Visit: Payer: Self-pay

## 2020-05-21 ENCOUNTER — Ambulatory Visit
Admission: RE | Admit: 2020-05-21 | Discharge: 2020-05-21 | Disposition: A | Discharge status: Home / Self Care | Payer: BC Managed Care – PPO | Source: Ambulatory Visit | Attending: Internal Medicine | Admitting: Internal Medicine

## 2020-05-21 ENCOUNTER — Ambulatory Visit: Payer: BC Managed Care – PPO

## 2020-05-21 DIAGNOSIS — Z1231 Encounter for screening mammogram for malignant neoplasm of breast: Secondary | ICD-10-CM

## 2020-05-25 ENCOUNTER — Other Ambulatory Visit: Payer: Self-pay | Admitting: Internal Medicine

## 2020-05-25 DIAGNOSIS — R928 Other abnormal and inconclusive findings on diagnostic imaging of breast: Secondary | ICD-10-CM

## 2020-06-01 ENCOUNTER — Ambulatory Visit
Admission: RE | Admit: 2020-06-01 | Discharge: 2020-06-01 | Disposition: A | Discharge status: Home / Self Care | Payer: BC Managed Care – PPO | Source: Ambulatory Visit | Attending: Internal Medicine | Admitting: Internal Medicine

## 2020-06-01 ENCOUNTER — Other Ambulatory Visit: Payer: Self-pay

## 2020-06-01 DIAGNOSIS — R928 Other abnormal and inconclusive findings on diagnostic imaging of breast: Secondary | ICD-10-CM

## 2020-06-12 ENCOUNTER — Other Ambulatory Visit: Payer: Self-pay | Admitting: Internal Medicine

## 2020-09-10 ENCOUNTER — Other Ambulatory Visit: Payer: Self-pay | Admitting: Internal Medicine

## 2021-01-11 ENCOUNTER — Other Ambulatory Visit: Payer: Self-pay | Admitting: Internal Medicine

## 2021-01-19 ENCOUNTER — Encounter: Payer: Self-pay | Admitting: Internal Medicine

## 2021-01-22 ENCOUNTER — Other Ambulatory Visit: Payer: Self-pay | Admitting: Internal Medicine

## 2021-02-01 ENCOUNTER — Telehealth: Payer: Self-pay | Admitting: Internal Medicine

## 2021-02-01 NOTE — Telephone Encounter (Signed)
Patient is needing a refill of levothyroxine (SYNTHROID) 88 MCG tablet to be sent to [id number] - [place], [place].  Patient has physical scheduled with Dr. Regis Bill for 1/11.  Please advise.

## 2021-02-02 ENCOUNTER — Other Ambulatory Visit: Payer: Self-pay

## 2021-02-02 MED ORDER — LEVOTHYROXINE SODIUM 88 MCG PO TABS: 88.0000 ug | ORAL_TABLET | Freq: Every day | ORAL | 0 refills | Status: DC

## 2021-02-02 NOTE — Telephone Encounter (Signed)
30 day supply sent to the pharmacy

## 2021-02-24 ENCOUNTER — Encounter: Payer: Self-pay | Admitting: Internal Medicine

## 2021-02-24 ENCOUNTER — Ambulatory Visit (INDEPENDENT_AMBULATORY_CARE_PROVIDER_SITE_OTHER): Payer: BC Managed Care – PPO | Admitting: Internal Medicine

## 2021-02-24 VITALS — BP 134/76 | HR 83 | Temp 98.4°F | Ht 61.0 in | Wt 205.6 lb

## 2021-02-24 DIAGNOSIS — Z2821 Immunization not carried out because of patient refusal: Secondary | ICD-10-CM

## 2021-02-24 DIAGNOSIS — Z809 Family history of malignant neoplasm, unspecified: Secondary | ICD-10-CM

## 2021-02-24 DIAGNOSIS — E669 Obesity, unspecified: Secondary | ICD-10-CM | POA: Diagnosis not present

## 2021-02-24 DIAGNOSIS — Z0000 Encounter for general adult medical examination without abnormal findings: Secondary | ICD-10-CM

## 2021-02-24 DIAGNOSIS — R7309 Other abnormal glucose: Secondary | ICD-10-CM

## 2021-02-24 DIAGNOSIS — E039 Hypothyroidism, unspecified: Secondary | ICD-10-CM

## 2021-02-24 DIAGNOSIS — Z23 Encounter for immunization: Secondary | ICD-10-CM

## 2021-02-24 DIAGNOSIS — Z79899 Other long term (current) drug therapy: Secondary | ICD-10-CM

## 2021-02-24 MED ORDER — TRIAMCINOLONE ACETONIDE 0.025 % EX CREA: TOPICAL_CREAM | CUTANEOUS | 1 refills | Status: DC

## 2021-02-24 NOTE — Patient Instructions (Signed)
Good to see you today .  Pap next year  if needed . Usually stop  around age 66 .   Will do genetic counseling referral.  Lab today   Will refill cream.   Advise prevnar 20 vaccine   ( also can get at pharmacy )

## 2021-02-24 NOTE — Progress Notes (Signed)
Chief Complaint  Patient presents with   Annual Exam    HPI: Patient  Jackie Lewis  66 y.o. comes in today for Preventive Health Care visit  And med eval Ent  sinus and sp mastoid stable  Thyroid taking  daily   Daughter has cancer gene and cancer   asks about genetic testing other cancer in family  Would like the shingle vaccine   is utd on covid vaccine. Health Maintenance  Topic Date Due   INFLUENZA VACCINE  09/14/2020   Pneumonia Vaccine 50+ Years old (1 - PCV) 08/24/2021 (Originally 04/16/1961)   COLONOSCOPY (Pts 45-53yrs Insurance coverage will need to be confirmed)  08/24/2021 (Originally 02/01/2021)   COVID-19 Vaccine (4 - Booster for [place] series) 08/24/2021 (Originally 02/08/2020)   Zoster Vaccines- Shingrix (2 of 2) 04/21/2021   MAMMOGRAM  05/22/2022   TETANUS/TDAP  06/14/2025   DEXA SCAN  Completed   Hepatitis C Screening  Completed   HPV VACCINES  Aged Out   Health Maintenance Review LIFESTYLE:  Exercise:    gardening    drawbridge  some  Tobacco/ETS: no Alcohol:   n Sugar beverages:  n Sleep:of and on  Drug use: no HH of  3  Caretaker for  son  who was disable by [place]  car pedestrian injury years ago  Husband still working    ROS:  REST of 12 system review negative except as per HPI   Past Medical History:  Diagnosis Date   Abnormal thyroid blood test    Allergic rhinitis    Allergy    Anxiety    Carpal tunnel    Bil   GERD (gastroesophageal reflux disease)    Headache(784.0)    Hearing loss    Bil/ worse on left side.   Recurrent UTI    Scleritis    hx   Thyroid disease     Past Surgical History:  Procedure Laterality Date   ESOPHAGOGASTRODUODENOSCOPY  3/09   EXCISION RADIAL HEAD  2015   right elbow   INNER EAR SURGERY     hole in right ear/ surg left ear 2-3 times   UPPER GASTROINTESTINAL ENDOSCOPY      Family History  Problem Relation Age of Onset   Stroke Father    Parkinsonism Father    Other Son        traumatic  brain injury 2011   Thyroid disease Other    Hypertension Other    Hyperlipidemia Other    Uterine cancer Sister    Colon cancer Paternal Aunt    Breast cancer Paternal Aunt    Heart disease Sister    Hemolytic uremic syndrome Daughter        atypical  under rx    Breast cancer Cousin     Social History   Socioeconomic History   Marital status: Married    Spouse name: Not on file   Number of children: Not on file   Years of education: Not on file   Highest education level: Not on file  Occupational History   Not on file  Tobacco Use   Smoking status: Never   Smokeless tobacco: Never  Vaping Use   Vaping Use: Never used  Substance and Sexual Activity   Alcohol use: No    Alcohol/week: 0.0 standard drinks   Drug use: No   Sexual activity: Not on file  Other Topics Concern   Not on file  Social History Narrative   Married hh of  3-   Regular exercise- no   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    [place] country of origin   Bereaved parent   Son  speaking some  Walks feeds sabnd dressed self  Can go to gym for som exercise      Hh  of 2 . Daughter graduated from [employer]  grad school in [place] living in [place]   Daughter   In [place]   Social Determinants of Health   Financial Resource Strain: Not on file  Food Insecurity: Not on file  Transportation Needs: Not on file  Physical Activity: Not on file  Stress: Not on file  Social Connections: Not on file    Outpatient Medications Prior to Visit  Medication Sig Dispense Refill   albuterol (VENTOLIN HFA) 108 (90 Base) MCG/ACT inhaler Inhale 1-2 puffs into the lungs every 6 (six) hours as needed for wheezing or shortness of breath. 18 g 1   calcium carbonate (OS-CAL) 1250 (500 Ca) MG chewable tablet Chew 2 tablets by mouth daily.     Cholecalciferol (VITAMIN D3) 1000 units CAPS Take 1  capsule by mouth daily.     fluticasone (FLONASE) 50 MCG/ACT nasal spray SPRAY 2 SPRAYS INTO EACH NOSTRIL EVERY DAY 48 mL 3   ibuprofen (ADVIL,MOTRIN) 400 MG tablet Take by mouth. USING PRN FOR KNEE PAIN     levothyroxine (SYNTHROID) 88 MCG tablet Take 1 tablet (88 mcg total) by mouth daily before breakfast. 30 tablet 0   SODIUM FLUORIDE 5000 SENSITIVE 1.1-5 % PSTE Take by mouth as directed.     triamcinolone (KENALOG) 0.025 % cream APPLY TO AFFECTED AREA TWICE DAILY AS DIRECTED 80 g 1   Facility-Administered Medications Prior to Visit  Medication Dose Route Frequency Provider Last Rate Last Admin   0.9 %  sodium chloride infusion  500 mL Intravenous Continuous Pyrtle, Lajuan Lines, MD         EXAM:  BP 134/76 (BP Location: Left Arm, Patient Position: Sitting, Cuff Size: Normal)    Pulse 83    Temp 98.4 F (36.9 C) (Oral)    Ht 5\' 1"  (1.549 m)    Wt 205 lb 9.6 oz (93.3 kg)    SpO2 97%    BMI 38.85 kg/m   Body mass index is 38.85 kg/m. Wt Readings from Last 3 Encounters:  02/24/21 205 lb 9.6 oz (93.3 kg)  09/02/19 207 lb 3.2 oz (94 kg)  07/22/19 210 lb 6.4 oz (95.4 kg)    Physical Exam: Vital signs reviewed QJJ:HERD is a well-developed well-nourished alert cooperative    who appearsr stated age in no acute distress.  HEENT: normocephalic atraumatic , Eyes: PERRL EOM's full, conjunctiva clear, Nares: paten,t no deformity discharge or tenderness., Ears: no deformity EAC's clear TMs with normal landmarks. Mouth:masked NECK: supple without masses, thyromegaly or bruits. CHEST/PULM:  Clear to auscultation and percussion breath sounds equal no wheeze , rales or rhonchi. No chest wall deformities or tenderness. Breast: normal by inspection . No dimpling, discharge, masses, tenderness or discharge . CV: PMI is nondisplaced, S1 S2 no gallops, murmurs, rubs. Peripheral pulses are full without delay.No JVD .  ABDOMEN: Bowel sounds normal nontender  No guard or rebound, no hepato splenomegal no CVA  tenderness.   Extremtities:  No clubbing cyanosis  or edema, no acute joint swelling or redness no focal atrophy NEURO:  Oriented x3, cranial nerves 3-12 appear to be intact, no obvious focal weakness,gait within normal limits no abnormal reflexes or asymmetrical SKIN: No acute rashes normal turgor, color, no bruising or petechiae. PSYCH: Oriented, good eye contact, no obvious depression anxiety, cognition and judgment appear normal. LN: no cervical axillary inguinal adenopathy  Lab Results  Component Value Date   WBC 6.8 09/02/2019   HGB 12.9 09/02/2019   HCT 40.6 09/02/2019   PLT 227 09/02/2019   GLUCOSE 91 09/02/2019   CHOL 200 (H) 09/02/2019   TRIG 118 09/02/2019   HDL 70 09/02/2019   LDLDIRECT 113.9 06/22/2010   LDLCALC 107 (H) 09/02/2019   ALT 24 09/02/2019   AST 18 09/02/2019   NA 140 09/02/2019   K 4.3 09/02/2019   CL 102 09/02/2019   CREATININE 0.69 09/02/2019   BUN 17 09/02/2019   CO2 29 09/02/2019   TSH 3.58 09/02/2019   HGBA1C 5.4 09/02/2019   MICROALBUR 0.7 04/14/2006    BP Readings from Last 3 Encounters:  02/24/21 134/76  09/02/19 122/84  07/22/19 118/68    Lab plan reviewed with patient .  fasting  ASSESSMENT AND PLAN:  Discussed the following assessment and plan:    ICD-10-CM   1. Visit for preventive health examination  F64.33 Basic metabolic panel    CBC with Differential/Platelet    Hemoglobin A1c    Hepatic function panel    Lipid panel    TSH    TSH    Lipid panel    Hepatic function panel    Hemoglobin A1c    CBC with Differential/Platelet    Basic metabolic panel    Varicella-zoster vaccine IM (Shingrix)    2. Medication management  I95.188 Basic metabolic panel    CBC with Differential/Platelet    Hemoglobin A1c    Hepatic function panel    Lipid panel    TSH    TSH    Lipid panel    Hepatic function panel    Hemoglobin A1c    CBC with Differential/Platelet    Basic metabolic panel    3. Hypothyroidism, unspecified type   C16.6 Basic metabolic panel    CBC with Differential/Platelet    Hemoglobin A1c    Hepatic function panel    Lipid panel    TSH    TSH    Lipid panel    Hepatic function panel    Hemoglobin A1c    CBC with Differential/Platelet    Basic metabolic panel    4. Obesity (BMI 30-39.9)  A63.0 Basic metabolic panel    CBC with Differential/Platelet    Hemoglobin A1c    Hepatic function panel    Lipid panel    TSH    TSH    Lipid panel    Hepatic function panel    Hemoglobin A1c    CBC with Differential/Platelet    Basic metabolic panel    5. HYPERGLYCEMIA  Z60.10 Basic metabolic panel    CBC with Differential/Platelet    Hemoglobin A1c    Hepatic function panel    Lipid panel    TSH    TSH    Lipid panel    Hepatic function panel    Hemoglobin A1c    CBC with Differential/Platelet    Basic metabolic panel    6. Family history of cancer  X32.3 Basic metabolic panel    CBC with Differential/Platelet  Hemoglobin A1c    Hepatic function panel    Lipid panel    TSH    TSH    Lipid panel    Hepatic function panel    Hemoglobin A1c    CBC with Differential/Platelet    Basic metabolic panel    Ambulatory referral to Genetics   daughters with cancer and gene plan gen counsel referral     7. Pneumococcal vaccination declined  Z28.21    today    Prevnar 20 declined today  will consider in future  Updated labs  Agree referral for genetic counseling  with new fam hx  Return in about 1 year (around 02/24/2022) for depending on results.  Patient Care Team: Burnis Medin, MD as PCP - General ent ortho  Patient Instructions  Good to see you today .  Pap next year  if needed . Usually stop  around age 45 .   Will do genetic counseling referral.  Lab today   Will refill cream.   Advise prevnar 20 vaccine   ( also can get at pharmacy )  Standley Brooking. Eve Rey M.D.

## 2021-02-25 LAB — BASIC METABOLIC PANEL
BUN: 17 mg/dL (ref 6–23)
CO2: 29 mEq/L (ref 19–32)
Calcium: 9.8 mg/dL (ref 8.4–10.5)
Chloride: 100 mEq/L (ref 96–112)
Creatinine, Ser: 0.79 mg/dL (ref 0.40–1.20)
GFR: 77.71 mL/min (ref >60.00)
Glucose, Bld: 85 mg/dL (ref 70–99)
Potassium: 3.8 mEq/L (ref 3.5–5.1)
Sodium: 138 mEq/L (ref 135–145)

## 2021-02-25 LAB — CBC WITH DIFFERENTIAL/PLATELET
Basophils Absolute: 0.1 10*3/uL (ref 0.0–0.1)
Basophils Relative: 0.9 % (ref 0.0–3.0)
Eosinophils Absolute: 0.2 10*3/uL (ref 0.0–0.7)
Eosinophils Relative: 2.8 % (ref 0.0–5.0)
HCT: 38.9 % (ref 36.0–46.0)
Hemoglobin: 12.8 g/dL (ref 12.0–15.0)
Lymphocytes Relative: 24.2 % (ref 12.0–46.0)
Lymphs Abs: 2.1 10*3/uL (ref 0.7–4.0)
MCHC: 32.9 g/dL (ref 30.0–36.0)
MCV: 83.5 fl (ref 78.0–100.0)
Monocytes Absolute: 0.5 10*3/uL (ref 0.1–1.0)
Monocytes Relative: 6 % (ref 3.0–12.0)
Neutro Abs: 5.8 10*3/uL (ref 1.4–7.7)
Neutrophils Relative %: 66.1 % (ref 43.0–77.0)
Platelets: 243 10*3/uL (ref 150.0–400.0)
RBC: 4.66 Mil/uL (ref 3.87–5.11)
RDW: 14.4 % (ref 11.5–15.5)
WBC: 8.7 10*3/uL (ref 4.0–10.5)

## 2021-02-25 LAB — LIPID PANEL
Cholesterol: 195 mg/dL (ref 0–200)
HDL: 77.4 mg/dL (ref >39.00)
LDL Cholesterol: 101 mg/dL — ABNORMAL HIGH (ref 0–99)
NonHDL: 117.11
Total CHOL/HDL Ratio: 3
Triglycerides: 82 mg/dL (ref 0.0–149.0)
VLDL: 16.4 mg/dL (ref 0.0–40.0)

## 2021-02-25 LAB — HEPATIC FUNCTION PANEL
ALT: 20 U/L (ref 0–35)
AST: 18 U/L (ref 0–37)
Albumin: 4.5 g/dL (ref 3.5–5.2)
Alkaline Phosphatase: 72 U/L (ref 39–117)
Bilirubin, Direct: 0.1 mg/dL (ref 0.0–0.3)
Total Bilirubin: 0.5 mg/dL (ref 0.2–1.2)
Total Protein: 8.3 g/dL (ref 6.0–8.3)

## 2021-02-25 LAB — TSH: TSH: 3.66 u[IU]/mL (ref 0.35–5.50)

## 2021-02-25 LAB — HEMOGLOBIN A1C: Hgb A1c MFr Bld: 5.8 % (ref 4.6–6.5)

## 2021-02-26 ENCOUNTER — Other Ambulatory Visit: Payer: Self-pay | Admitting: Internal Medicine

## 2021-03-04 NOTE — Progress Notes (Signed)
Results are normal or stable   same dosing of thyroid med  ok to refill 90 days x 3 when due

## 2021-03-08 ENCOUNTER — Other Ambulatory Visit: Payer: Self-pay

## 2021-03-08 MED ORDER — LEVOTHYROXINE SODIUM 88 MCG PO TABS: 88.0000 ug | ORAL_TABLET | Freq: Every day | ORAL | 3 refills | Status: DC

## 2021-03-30 ENCOUNTER — Encounter: Payer: Self-pay | Admitting: Genetic Counselor

## 2021-03-30 ENCOUNTER — Inpatient Hospital Stay: Payer: BC Managed Care – PPO | Attending: Internal Medicine | Admitting: Genetic Counselor

## 2021-03-30 ENCOUNTER — Other Ambulatory Visit: Payer: Self-pay

## 2021-03-30 ENCOUNTER — Inpatient Hospital Stay: Payer: BC Managed Care – PPO

## 2021-03-30 DIAGNOSIS — Z803 Family history of malignant neoplasm of breast: Secondary | ICD-10-CM | POA: Diagnosis not present

## 2021-03-30 DIAGNOSIS — Z8049 Family history of malignant neoplasm of other genital organs: Secondary | ICD-10-CM | POA: Diagnosis not present

## 2021-03-30 DIAGNOSIS — Z8481 Family history of carrier of genetic disease: Secondary | ICD-10-CM | POA: Diagnosis not present

## 2021-03-30 DIAGNOSIS — Z8042 Family history of malignant neoplasm of prostate: Secondary | ICD-10-CM

## 2021-03-30 LAB — GENETIC SCREENING ORDER

## 2021-03-30 NOTE — Progress Notes (Signed)
REFERRING PROVIDER: Burnis Medin, MD [place],  [place] [address]  PRIMARY PROVIDER:  Burnis Medin, MD  PRIMARY REASON FOR VISIT:  1. Family history of gene mutation   2. Family history of breast cancer   3. Family history of uterine cancer   4. Family history of prostate cancer in father     HISTORY OF PRESENT ILLNESS:   Jackie Lewis. Vessel, a 66 y.o. female, was seen for a [facility] cancer genetics consultation at the request of Dr. Regis Bill due to a family history of a CHEK2 gene mutation.  Jackie Lewis. Pontarelli presents to clinic today to discuss the possibility of a hereditary predisposition to cancer, to discuss genetic testing, and to further clarify her future cancer risks, as well as potential cancer risks for family members.   Jackie Lewis. Polsky is a 66 y.o. female with no personal history of cancer.    CANCER HISTORY:  Oncology History   No history exists.   RISK FACTORS:  OCP use for < 1 year Ovaries intact: yes.  Uterus intact: yes.  Menopausal status: postmenopausal.  HRT use: 0 years. Colonoscopy: yes; most recent was in 2017 and 3 polyps were identified (2 tubular adenomas), she currently has colonoscopies at 5 year intervals Mammogram within the last year: yes. Number of breast biopsies: 0. Any excessive radiation exposure in the past: no  Past Medical History:  Diagnosis Date   Abnormal thyroid blood test    Allergic rhinitis    Allergy    Anxiety    Carpal tunnel    Bil   GERD (gastroesophageal reflux disease)    Headache(784.0)    Hearing loss    Bil/ worse on left side.   Recurrent UTI    Scleritis    hx   Thyroid disease     Past Surgical History:  Procedure Laterality Date   ESOPHAGOGASTRODUODENOSCOPY  3/09   EXCISION RADIAL HEAD  2015   right elbow   INNER EAR SURGERY     hole in right ear/ surg left ear 2-3 times   UPPER GASTROINTESTINAL ENDOSCOPY      Social History   Socioeconomic History   Marital status: Married    Spouse name:  Not on file   Number of children: Not on file   Years of education: Not on file   Highest education level: Not on file  Occupational History   Not on file  Tobacco Use   Smoking status: Never   Smokeless tobacco: Never  Vaping Use   Vaping Use: Never used  Substance and Sexual Activity   Alcohol use: No    Alcohol/week: 0.0 standard drinks   Drug use: No   Sexual activity: Not on file  Other Topics Concern   Not on file  Social History Narrative   Married hh of 3-   Regular exercise- no   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    [place] country of origin   Bereaved parent   Son  speaking some  Walks feeds sabnd dressed self  Can go to gym for som exercise      Hh  of 2 . Daughter graduated from [employer]  grad school in [place] living in [place]   Daughter   In [place]   Social Determinants  of Health   Financial Resource Strain: Not on file  Food Insecurity: Not on file  Transportation Needs: Not on file  Physical Activity: Not on file  Stress: Not on file  Social Connections: Not on file     FAMILY HISTORY:  We obtained a detailed, 4-generation family history.  Significant diagnoses are listed below: Family History  Problem Relation Age of Onset   Uterine cancer Sister 26   Colon cancer Maternal Aunt    Breast cancer Paternal 42    Breast cancer Daughter 49       CHEK2+   Other Daughter        CHEK2+   Breast cancer Cousin        paternal   Prostate cancer Cousin 67       maternal first cousin (also her husband), CHEK2+   There is no reported Ashkenazi Jewish ancestry.     GENETIC COUNSELING ASSESSMENT: Jackie Lewis. Tarpley is a 66 y.o. female with with a family history of a CHEK2 gene mutation. Therefore, we discussed the cancer risks and management recommendations for individuals with a CHEK2 mutation with a focus on management in  females.     DISCUSSION:    Cancer Risks for CHEK2: Women have a 24-48% lifetime risk of breast cancer. Men are thought to be at an increased risk of prostate cancer. The exact risk figure is unknown at this time. 8-9% lifetime risk of colorectal cancer Data suggests a possible increased risk for ovarian, endometrial, thyroid, and other types of cancer    Management Recommendations:  Breast Cancer Screening/Risk Reduction:  Women: Breast cancer screening includes: Breast awareness beginning at age 31 Monthly self-breast examination beginning at age 75 Clinical breast examination every 6-12 months beginning at age 66 or at the age of the earliest diagnosed breast cancer in the family, if onset was before age 35 Annual mammogram with consideration of tomosynthesis starting at age 44 or 31 years prior to the youngest age of diagnosis, whichever comes first Consider breast MRI with contrast starting at age 14-35 Evidence is insufficient for a prophylactic risk-reducing mastectomy, manage based on family history   Colon Cancer Screening: Colonoscopy screening every 5 years beginning at age 28 If an individual has a first-degree relative with colorectal cancer, screening should begin 10 years prior to the relatives age at diagnosis if before 62. If an individual has a personal history of colorectal cancer, screening recommendations should be based on recommendations for post-colorectal cancer resection.  Implications for Family Members: Hereditary predisposition to cancer due to pathogenic variants in the CHEK2 gene has autosomal dominant inheritance. This means that an individual with a pathogenic variant has a 50% chance of passing the condition on to his/her offspring. Identification of a pathogenic variant allows for the recognition of at-risk relatives who can pursue testing for the familial variant. We discussed that Jackie Lewis. Cleverly's risk depends on whether her husband inherited the mutation  from his mother or father. She is currently at a 12.5% risk of carrying the familial CHEK2 mutation.    Testing Options: Jackie Lewis. Horsey was offered site specific genetic testing for the familial CHEK2 mutation and a gene panel. Jackie Lewis. Peeks was informed of the benefits and limitations of each option. After consideration, Jackie Lewis. Encalada elected to have free familial single site genetic testing for the CHEK2 gene.   PLAN: After considering the risks, benefits, and limitations, Jackie Lewis. Schreckengost provided informed consent to pursue genetic testing and the blood sample was sent to [facility]  for analysis of the CHEK2 free familial site specific genetic testing. Results should be available within approximately 2-3 weeks' time, at which point they will be disclosed by telephone to Jackie Lewis. Saia, as will any additional recommendations warranted by these results. Jackie Lewis. Wedemeyer will receive a summary of her genetic counseling visit and a copy of her results once available. This information will also be available in Epic.   Jackie Lewis. Well questions were answered to her satisfaction today. Our contact information was provided should additional questions or concerns arise. Thank you for the referral and allowing [place] to share in the care of your patient.   Jackie Passy, Jackie Lewis, [facility] Genetic Counselor [email or web address] (P) [phone]  The patient was seen for a total of 30 minutes in face-to-face genetic counseling. The patient was seen alone.  Drs. Lindi Adie and/or Burr Medico were available to discuss this case as needed.  _______________________________________________________________________ For Office Staff:  Number of people involved in session: 1 Was an Intern/ student involved with case: no

## 2021-04-14 ENCOUNTER — Telehealth: Payer: Self-pay | Admitting: Genetic Counselor

## 2021-04-14 ENCOUNTER — Encounter: Payer: Self-pay | Admitting: Genetic Counselor

## 2021-04-14 DIAGNOSIS — Z1379 Encounter for other screening for genetic and chromosomal anomalies: Secondary | ICD-10-CM | POA: Insufficient documentation

## 2021-04-14 NOTE — Telephone Encounter (Signed)
I contacted Jackie Lewis to discuss her genetic testing results. The results were Negative. 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 Detailed clinic note to follow. ? ?The test report has been scanned into EPIC and is located under the Molecular Pathology section of the Results Review tab.  A portion of the result report is included below for reference.  ? ?Lucille Passy, MS, [place] ?Genetic Counselor ?[email or web address] ?(P) [phone] ? ? ?

## 2021-04-16 ENCOUNTER — Ambulatory Visit: Payer: Self-pay | Admitting: Genetic Counselor

## 2021-04-16 DIAGNOSIS — Z1379 Encounter for other screening for genetic and chromosomal anomalies: Secondary | ICD-10-CM

## 2021-04-16 NOTE — Progress Notes (Signed)
HPI:   ?Ms. Currier was previously seen in the [facility] clinic due to a family history of a CHEK2 gene mutation and concerns regarding a hereditary predisposition to cancer. Please refer to our prior cancer genetics clinic note for more information regarding our discussion, assessment and recommendations, at the time. Ms. Catapano recent genetic test results were disclosed to her, as were recommendations warranted by these results. These results and recommendations are discussed in more detail below. ? ?CANCER HISTORY:  ?Oncology History  ? No history exists.  ? ? ?FAMILY HISTORY:  ?We obtained a detailed, 4-generation family history.  Significant diagnoses are listed below: ?     ?Family History  ?Problem Relation Age of Onset  ? Uterine cancer Sister 42  ? Colon cancer Maternal Aunt    ? Breast cancer Paternal Aunt    ? Breast cancer Daughter 10  ?      CHEK2+  ? Other Daughter    ?      CHEK2+  ? Breast cancer Cousin    ?      paternal  ? Prostate cancer Cousin 56  ?      maternal first cousin (also her husband), CHEK2+  ?  ?There is no reported Ashkenazi Jewish ancestry. ?  ? ?GENETIC TEST RESULTS:  ?The [facility] genetic test result was Negative. 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  ? ?The test report has been scanned into EPIC and is located under the Molecular Pathology section of the Results Review tab.  A portion of the result report is included below for reference. Genetic testing reported out on 04/06/2021.  ? ? ? ?EXPLANATION: ?We recommended Ms. 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 Ms. Neubert test was normal and did not reveal the familial mutation. We call this result a true negative result because the cancer-causing mutation was identified in Ms. Vanwagoner's family, and she did not inherit it. Given this negative result, Ms. Larocque's chances of developing CHEK2-related cancers are the same as they are in  the general population.   ? ?RECOMMENDATIONS FOR FAMILY MEMBERS:   ?Other members of the family may still carry a pathogenic variant in the CHEK2 gene. Based on the family history, we recommend her children have genetic counseling and testing.  ? ?Our contact number was provided. Ms. Crosland questions were answered to her satisfaction, and she knows she is welcome to call us at anytime with additional questions or concerns.  ? ?Lucille Passy, MS, [place] ?Genetic Counselor ?[email or web address] ?(P) [phone] ? ? ?

## 2021-05-31 ENCOUNTER — Encounter: Payer: Self-pay | Admitting: Genetic Counselor

## 2021-07-16 ENCOUNTER — Other Ambulatory Visit: Payer: Self-pay | Admitting: Internal Medicine

## 2021-07-16 DIAGNOSIS — Z1231 Encounter for screening mammogram for malignant neoplasm of breast: Secondary | ICD-10-CM

## 2021-07-23 ENCOUNTER — Ambulatory Visit
Admission: RE | Admit: 2021-07-23 | Discharge: 2021-07-23 | Disposition: A | Discharge status: Home / Self Care | Payer: BC Managed Care – PPO | Source: Ambulatory Visit | Attending: Internal Medicine | Admitting: Internal Medicine

## 2021-07-23 DIAGNOSIS — Z1231 Encounter for screening mammogram for malignant neoplasm of breast: Secondary | ICD-10-CM

## 2022-01-18 ENCOUNTER — Encounter: Payer: Self-pay | Admitting: Family Medicine

## 2022-01-18 ENCOUNTER — Ambulatory Visit: Payer: BC Managed Care – PPO | Admitting: Family Medicine

## 2022-01-18 VITALS — BP 128/70 | HR 84 | Temp 98.1°F | Ht 61.0 in | Wt 211.2 lb

## 2022-01-18 DIAGNOSIS — J020 Streptococcal pharyngitis: Secondary | ICD-10-CM

## 2022-01-18 DIAGNOSIS — J029 Acute pharyngitis, unspecified: Secondary | ICD-10-CM | POA: Diagnosis not present

## 2022-01-18 LAB — POCT RAPID STREP A (OFFICE): Rapid Strep A Screen: POSITIVE — AB

## 2022-01-18 MED ORDER — AMOXICILLIN 500 MG PO CAPS: 500.0000 mg | ORAL_CAPSULE | Freq: Three times a day (TID) | ORAL | 0 refills | Status: AC

## 2022-01-18 NOTE — Progress Notes (Signed)
Established Patient Office Visit  Subjective   Patient ID: Jackie Lewis, female    DOB: [birth year]  Age: 66 y.o. MRN: [id number]  Chief Complaint  Patient presents with   Sore Throat    Patient complains of sore throat, x3 weeks, Tried Ibuprofen with little relif    Ear Pain    Patient complains of ear pain, x2 weeks    HPI   She is seen with 3-week history of some sore throat and right ear symptoms.  Her grandson is actually now living with them and he is just getting over strep throat.  Patient relates that she has had some mild cough and some chronic nasal congestion but no purulent nasal secretions.  She has some right ear pain and has had some previous surgery on the left ear.  No current headaches.  No fevers or chills.  Past Medical History:  Diagnosis Date   Abnormal thyroid blood test    Allergic rhinitis    Allergy    Anxiety    Carpal tunnel    Bil   GERD (gastroesophageal reflux disease)    Headache(784.0)    Hearing loss    Bil/ worse on left side.   Recurrent UTI    Scleritis    hx   Thyroid disease    Past Surgical History:  Procedure Laterality Date   ESOPHAGOGASTRODUODENOSCOPY  3/09   EXCISION RADIAL HEAD  2015   right elbow   INNER EAR SURGERY     hole in right ear/ surg left ear 2-3 times   UPPER GASTROINTESTINAL ENDOSCOPY      reports that she has never smoked. She has never used smokeless tobacco. She reports that she does not drink alcohol and does not use drugs. family history includes Breast cancer in her cousin and paternal aunt; Breast cancer (age of onset: 31) in her daughter; Colon cancer in her maternal aunt; Heart disease in her sister; Hemolytic uremic syndrome in her daughter; Hyperlipidemia in an other family member; Hypertension in an other family member; Lung cancer in her paternal uncle; Other in her daughter and son; Parkinsonism in her father; Prostate cancer (age of onset: 73) in her cousin; Stroke in her father; Thyroid disease in  an other family member; Uterine cancer (age of onset: 38) in her sister. Allergies  Allergen Reactions   Epinephrine     REACTION: dental injection got tachycardia   Nitrofurantoin Monohyd Macro Rash    Review of Systems  Constitutional:  Negative for chills and fever.  HENT:  Positive for ear pain and sore throat.   Respiratory:  Positive for cough.   Cardiovascular:  Negative for chest pain.  Neurological:  Negative for headaches.      Objective:     BP 128/70 (BP Location: Left Arm, Patient Position: Sitting, Cuff Size: Large)   Pulse 84   Temp 98.1 F (36.7 C) (Oral)   Ht '5\' 1"'$  (1.549 m)   Wt 211 lb 3.2 oz (95.8 kg)   SpO2 98%   BMI 39.91 kg/m    Physical Exam Vitals reviewed.  Constitutional:      General: She is not in acute distress.    Appearance: She is not ill-appearing.  HENT:     Ears:     Comments: Ear drums show no acute findings.  No erythema.  No visible effusion    Mouth/Throat:     Comments: Minimal posterior pharynx erythema.  No exudate. Cardiovascular:     Rate and  Rhythm: Normal rate and regular rhythm.  Pulmonary:     Effort: Pulmonary effort is normal.     Breath sounds: Normal breath sounds.  Musculoskeletal:     Cervical back: Neck supple.  Lymphadenopathy:     Cervical: No cervical adenopathy.  Neurological:     Mental Status: She is alert.      No results found for any visits on 01/18/22.    The 10-year ASCVD risk score (Arnett DK, et al., 2019) is: 6.1%    Assessment & Plan:   Problem List Items Addressed This Visit   None Visit Diagnoses     Sore throat    -  Primary   Relevant Orders   POC Rapid Strep A     Patient presents with 3-week history of sore throat and right ear symptoms.  No evidence for otitis media.  Rapid strep obtained  Rapid strep came back positive. -Start amoxicillin 500 mg 3 times daily for 10 days -Follow-up with primary if symptoms persist or worsen  No follow-ups on file.    Carolann Littler, MD

## 2022-04-14 ENCOUNTER — Other Ambulatory Visit: Payer: Self-pay | Admitting: Internal Medicine

## 2022-04-19 ENCOUNTER — Ambulatory Visit (INDEPENDENT_AMBULATORY_CARE_PROVIDER_SITE_OTHER): Payer: BC Managed Care – PPO | Admitting: Family Medicine

## 2022-04-19 ENCOUNTER — Encounter: Payer: Self-pay | Admitting: Family Medicine

## 2022-04-19 VITALS — BP 124/72 | HR 82 | Temp 97.5°F | Wt 210.0 lb

## 2022-04-19 DIAGNOSIS — H6122 Impacted cerumen, left ear: Secondary | ICD-10-CM

## 2022-04-19 NOTE — Progress Notes (Signed)
   Subjective:    Patient ID: Jackie Lewis, female    DOB: [birth year], 67 y.o.   MRN: [id number]  HPI Here for one week of mild pain in the left ear as well as decreased hearing. No sinus congestion or ST.    Review of Systems  Constitutional: Negative.   HENT:  Positive for ear discharge, ear pain and hearing loss. Negative for congestion, postnasal drip, sinus pressure and sore throat.   Eyes: Negative.   Respiratory: Negative.         Objective:   Physical Exam Constitutional:      General: She is not in acute distress.    Appearance: Normal appearance.  HENT:     Right Ear: Tympanic membrane, ear canal and external ear normal.     Left Ear: There is impacted cerumen.     Nose: Nose normal.     Mouth/Throat:     Pharynx: Oropharynx is clear.  Eyes:     Conjunctiva/sclera: Conjunctivae normal.  Pulmonary:     Effort: Pulmonary effort is normal.     Breath sounds: Normal breath sounds.  Lymphadenopathy:     Cervical: No cervical adenopathy.  Neurological:     Mental Status: She is alert.           Assessment & Plan:  Impacted cerumen. She requested to see a "specialist" about this, so we will refer her to ENT. Alysia Penna, MD

## 2022-04-30 ENCOUNTER — Telehealth: Payer: BC Managed Care – PPO | Admitting: Family Medicine

## 2022-04-30 DIAGNOSIS — N3000 Acute cystitis without hematuria: Secondary | ICD-10-CM | POA: Diagnosis not present

## 2022-04-30 MED ORDER — CEPHALEXIN 500 MG PO CAPS: 500.0000 mg | ORAL_CAPSULE | Freq: Two times a day (BID) | ORAL | 0 refills | Status: AC

## 2022-04-30 NOTE — Progress Notes (Signed)

## 2022-05-31 NOTE — Progress Notes (Unsigned)
No chief complaint on file.   HPI: Patient  Jackie Lewis  67 y.o. comes in today for Preventive Health Care visit   Hx of mastoidectomy  Last uti rx e visit  3 16  keflex 500 bid for 7 days  Health Maintenance  Topic Date Due   Medicare Annual Wellness (AWV)  Never done   Pneumonia Vaccine 81+ Years old (1 of 1 - PCV) Never done   COLONOSCOPY (Pts 45-55yrs Insurance coverage will need to be confirmed)  02/01/2021   Zoster Vaccines- Shingrix (2 of 2) 04/21/2021   COVID-19 Vaccine (4 - 2023-24 season) 10/15/2021   INFLUENZA VACCINE  09/15/2022   MAMMOGRAM  07/24/2023   DTaP/Tdap/Td (3 - Td or Tdap) 06/14/2025   DEXA SCAN  Completed   Hepatitis C Screening  Completed   HPV VACCINES  Aged Out   Health Maintenance Review LIFESTYLE:  Exercise:   Tobacco/ETS: Alcohol:  Sugar beverages: Sleep: Drug use: no HH of  Work:    ROS:  GEN/ HEENT: No fever, significant weight changes sweats headaches vision problems hearing changes, CV/ PULM; No chest pain shortness of breath cough, syncope,edema  change in exercise tolerance. GI /GU: No adominal pain, vomiting, change in bowel habits. No blood in the stool. No significant GU symptoms. SKIN/HEME: ,no acute skin rashes suspicious lesions or bleeding. No lymphadenopathy, nodules, masses.  NEURO/ PSYCH:  No neurologic signs such as weakness numbness. No depression anxiety. IMM/ Allergy: No unusual infections.  Allergy .   REST of 12 system review negative except as per HPI   Past Medical History:  Diagnosis Date   Abnormal thyroid blood test    Allergic rhinitis    Allergy    Anxiety    Carpal tunnel    Bil   GERD (gastroesophageal reflux disease)    Headache(784.0)    Hearing loss    Bil/ worse on left side.   Recurrent UTI    Scleritis    hx   Thyroid disease     Past Surgical History:  Procedure Laterality Date   ESOPHAGOGASTRODUODENOSCOPY  3/09   EXCISION RADIAL HEAD  2015   right elbow   INNER EAR SURGERY      hole in right ear/ surg left ear 2-3 times   UPPER GASTROINTESTINAL ENDOSCOPY      Family History  Problem Relation Age of Onset   Stroke Father    Parkinsonism Father    Uterine cancer Sister 62   Heart disease Sister    Colon cancer Maternal Aunt    Breast cancer Paternal Aunt    Lung cancer Paternal Uncle        heavy smoker   Hemolytic uremic syndrome Daughter        atypical  under rx    Breast cancer Daughter 53       CHEK2+   Other Daughter        CHEK2+   Other Son        traumatic brain injury 2011   Breast cancer Cousin        paternal   Prostate cancer Cousin 89       maternal first cousin (also her husband), CHEK2+   Thyroid disease Other    Hypertension Other    Hyperlipidemia Other     Social History   Socioeconomic History   Marital status: Married    Spouse name: Not on file   Number of children: Not on file   Years of education:  Not on file   Highest education level: Not on file  Occupational History   Not on file  Tobacco Use   Smoking status: Never   Smokeless tobacco: Never  Vaping Use   Vaping Use: Never used  Substance and Sexual Activity   Alcohol use: No    Alcohol/week: 0.0 standard drinks of alcohol   Drug use: No   Sexual activity: Not on file  Other Topics Concern   Not on file  Social History Narrative   Married hh of 3-   Regular exercise- no   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    [place] country of origin   Bereaved parent   Son  speaking some  Walks feeds sabnd dressed self  Can go to gym for som exercise      Hh  of 2 . Daughter graduated from [employer]  grad school in [place] [facility] [employer] [facility]   Got hus living in [place]   Daughter   In [place]   [place]   Social Determinants of Health   Financial Resource Strain: Not on file  Food Insecurity: Not on file  Transportation Needs: Not on file  Physical Activity: Not on  file  Stress: Not on file  Social Connections: Not on file    Outpatient Medications Prior to Visit  Medication Sig Dispense Refill   albuterol (VENTOLIN HFA) 108 (90 Base) MCG/ACT inhaler Inhale 1-2 puffs into the lungs every 6 (six) hours as needed for wheezing or shortness of breath. 18 g 1   calcium carbonate (OS-CAL) 1250 (500 Ca) MG chewable tablet Chew 2 tablets by mouth daily.     Cholecalciferol (VITAMIN D3) 1000 units CAPS Take 1 capsule by mouth daily.     fluticasone (FLONASE) 50 MCG/ACT nasal spray SPRAY 2 SPRAYS INTO EACH NOSTRIL EVERY DAY 48 mL 3   ibuprofen (ADVIL,MOTRIN) 400 MG tablet Take by mouth. USING PRN FOR KNEE PAIN     levothyroxine (SYNTHROID) 88 MCG tablet TAKE 1 TABLET BY MOUTH DAILY BEFORE BREAKFAST. 90 tablet 3   SODIUM FLUORIDE 5000 SENSITIVE 1.1-5 % PSTE Take by mouth as directed.     triamcinolone (KENALOG) 0.025 % cream APPLY TO AFFECTED AREA TWICE DAILY AS DIRECTED 80 g 1   Facility-Administered Medications Prior to Visit  Medication Dose Route Frequency Provider Last Rate Last Admin   0.9 %  sodium chloride infusion  500 mL Intravenous Continuous Pyrtle, Carie Caddy, MD         EXAM:  There were no vitals taken for this visit.  There is no height or weight on file to calculate BMI. Wt Readings from Last 3 Encounters:  04/19/22 210 lb (95.3 kg)  01/18/22 211 lb 3.2 oz (95.8 kg)  02/24/21 205 lb 9.6 oz (93.3 kg)    Physical Exam: Vital signs reviewed ZOX:WRUE is a well-developed well-nourished alert cooperative    who appearsr stated age in no acute distress.  HEENT: normocephalic atraumatic , Eyes: PERRL EOM's full, conjunctiva clear, Nares: paten,t no deformity discharge or tenderness., Ears: no deformity EAC's clear TMs with normal landmarks. Mouth: clear OP, no lesions, edema.  Moist mucous membranes. Dentition in adequate repair. NECK: supple without masses, thyromegaly or bruits. CHEST/PULM:  Clear to auscultation and percussion breath sounds  equal no wheeze , rales or rhonchi. No chest wall deformities or tenderness.  Breast: normal by inspection . No dimpling, discharge, masses, tenderness or discharge . CV: PMI is nondisplaced, S1 S2 no gallops, murmurs, rubs. Peripheral pulses are full without delay.No JVD .  ABDOMEN: Bowel sounds normal nontender  No guard or rebound, no hepato splenomegal no CVA tenderness.  No hernia. Extremtities:  No clubbing cyanosis or edema, no acute joint swelling or redness no focal atrophy NEURO:  Oriented x3, cranial nerves 3-12 appear to be intact, no obvious focal weakness,gait within normal limits no abnormal reflexes or asymmetrical SKIN: No acute rashes normal turgor, color, no bruising or petechiae. PSYCH: Oriented, good eye contact, no obvious depression anxiety, cognition and judgment appear normal. LN: no cervical axillary inguinal adenopathy  Lab Results  Component Value Date   WBC 8.7 02/24/2021   HGB 12.8 02/24/2021   HCT 38.9 02/24/2021   PLT 243.0 02/24/2021   GLUCOSE 85 02/24/2021   CHOL 195 02/24/2021   TRIG 82.0 02/24/2021   HDL 77.40 02/24/2021   LDLDIRECT 113.9 06/22/2010   LDLCALC 101 (H) 02/24/2021   ALT 20 02/24/2021   AST 18 02/24/2021   NA 138 02/24/2021   K 3.8 02/24/2021   CL 100 02/24/2021   CREATININE 0.79 02/24/2021   BUN 17 02/24/2021   CO2 29 02/24/2021   TSH 3.66 02/24/2021   HGBA1C 5.8 02/24/2021   MICROALBUR 0.7 04/14/2006    BP Readings from Last 3 Encounters:  04/19/22 124/72  01/18/22 128/70  02/24/21 134/76    Lab results reviewed with patient   ASSESSMENT AND PLAN:  Discussed the following assessment and plan:  No diagnosis found. No follow-ups on file.  Patient Care Team: Madelin Headings, MD as PCP - General ent ortho  There are no Patient Instructions on file for this visit.  Neta Mends. Areliz Rothman M.D.

## 2022-06-01 ENCOUNTER — Encounter: Payer: Self-pay | Admitting: Internal Medicine

## 2022-06-01 ENCOUNTER — Ambulatory Visit (INDEPENDENT_AMBULATORY_CARE_PROVIDER_SITE_OTHER): Payer: BC Managed Care – PPO | Admitting: Internal Medicine

## 2022-06-01 VITALS — BP 148/84 | HR 80 | Temp 97.7°F | Ht 60.8 in | Wt 211.4 lb

## 2022-06-01 DIAGNOSIS — Z79899 Other long term (current) drug therapy: Secondary | ICD-10-CM | POA: Diagnosis not present

## 2022-06-01 DIAGNOSIS — E039 Hypothyroidism, unspecified: Secondary | ICD-10-CM | POA: Diagnosis not present

## 2022-06-01 DIAGNOSIS — R7309 Other abnormal glucose: Secondary | ICD-10-CM | POA: Diagnosis not present

## 2022-06-01 DIAGNOSIS — H9190 Unspecified hearing loss, unspecified ear: Secondary | ICD-10-CM

## 2022-06-01 DIAGNOSIS — E669 Obesity, unspecified: Secondary | ICD-10-CM | POA: Diagnosis not present

## 2022-06-01 DIAGNOSIS — R030 Elevated blood-pressure reading, without diagnosis of hypertension: Secondary | ICD-10-CM

## 2022-06-01 LAB — CBC WITH DIFFERENTIAL/PLATELET
Basophils Absolute: 0 10*3/uL (ref 0.0–0.1)
Basophils Relative: 0.5 % (ref 0.0–3.0)
Eosinophils Absolute: 0.3 10*3/uL (ref 0.0–0.7)
Eosinophils Relative: 4.1 % (ref 0.0–5.0)
HCT: 37.6 % (ref 36.0–46.0)
Hemoglobin: 12.5 g/dL (ref 12.0–15.0)
Lymphocytes Relative: 29.4 % (ref 12.0–46.0)
Lymphs Abs: 2 10*3/uL (ref 0.7–4.0)
MCHC: 33.2 g/dL (ref 30.0–36.0)
MCV: 82.3 fl (ref 78.0–100.0)
Monocytes Absolute: 0.4 10*3/uL (ref 0.1–1.0)
Monocytes Relative: 5.5 % (ref 3.0–12.0)
Neutro Abs: 4.2 10*3/uL (ref 1.4–7.7)
Neutrophils Relative %: 60.5 % (ref 43.0–77.0)
Platelets: 231 10*3/uL (ref 150.0–400.0)
RBC: 4.57 Mil/uL (ref 3.87–5.11)
RDW: 15.5 % (ref 11.5–15.5)
WBC: 6.9 10*3/uL (ref 4.0–10.5)

## 2022-06-01 LAB — LIPID PANEL
Cholesterol: 176 mg/dL (ref 0–200)
HDL: 73.2 mg/dL (ref >39.00)
LDL Cholesterol: 84 mg/dL (ref 0–99)
NonHDL: 102.42
Total CHOL/HDL Ratio: 2
Triglycerides: 93 mg/dL (ref 0.0–149.0)
VLDL: 18.6 mg/dL (ref 0.0–40.0)

## 2022-06-01 LAB — COMPREHENSIVE METABOLIC PANEL
ALT: 22 U/L (ref 0–35)
AST: 20 U/L (ref 0–37)
Albumin: 4.2 g/dL (ref 3.5–5.2)
Alkaline Phosphatase: 69 U/L (ref 39–117)
BUN: 20 mg/dL (ref 6–23)
CO2: 27 mEq/L (ref 19–32)
Calcium: 9.3 mg/dL (ref 8.4–10.5)
Chloride: 103 mEq/L (ref 96–112)
Creatinine, Ser: 0.77 mg/dL (ref 0.40–1.20)
GFR: 79.43 mL/min (ref >60.00)
Glucose, Bld: 98 mg/dL (ref 70–99)
Potassium: 3.8 mEq/L (ref 3.5–5.1)
Sodium: 140 mEq/L (ref 135–145)
Total Bilirubin: 0.4 mg/dL (ref 0.2–1.2)
Total Protein: 7.6 g/dL (ref 6.0–8.3)

## 2022-06-01 LAB — HEMOGLOBIN A1C: Hgb A1c MFr Bld: 5.8 % (ref 4.6–6.5)

## 2022-06-01 LAB — TSH: TSH: 3.08 u[IU]/mL (ref 0.35–5.50)

## 2022-06-01 NOTE — Patient Instructions (Addendum)
Good to see  you today . Bp goal below 140/90 average    acceptable . 130/80 and below optimum.   Lab today . Get shingrix vaccine 2 at pharmacy  . Can get prevnar 20  at pharmacy also  ( pneumooccal vaccine )  Try adding flonase or equivalent  nose spray for  5-7 days when flaring or every day if needed.for sinuses issues.   Take blood pressure readings twice a day for 7- 10 days and record .     Take 2 -3 readings at each sitting .  Can send in readings  by My Chart.   Before checking your blood pressure make sure: You are seated and quite for 5 min before checking Feet are flat on the floor Siting in chair with your back supported straight up and down Arm resting on table or arm of chair at heart level Bladder is empty You have NOT had caffeine or tobacco within the last 30 min  [email or web address]

## 2022-06-02 NOTE — Progress Notes (Signed)
Blood result in range   no diabetes but A1c in borderline prediabetes range . Thyroid in range . Cholesterol on favorable range .

## 2022-06-28 ENCOUNTER — Telehealth: Payer: Self-pay | Admitting: Internal Medicine

## 2022-06-28 NOTE — Telephone Encounter (Signed)
Contacted Jackie Lewis to schedule their annual wellness visit. Welcome to Medicare visit Due by 02/15/23.  Jackie Lewis AWV direct phone # [phone]   WTM before 02/15/23 per [facility]

## 2022-08-17 ENCOUNTER — Telehealth: Payer: BC Managed Care – PPO | Admitting: Nurse Practitioner

## 2022-08-17 DIAGNOSIS — U071 COVID-19: Secondary | ICD-10-CM

## 2022-08-17 MED ORDER — BENZONATATE 100 MG PO CAPS
100.0000 mg | ORAL_CAPSULE | Freq: Three times a day (TID) | ORAL | 0 refills | Status: DC | PRN
Start: 2022-08-17 — End: 2023-06-06

## 2022-08-17 MED ORDER — NIRMATRELVIR/RITONAVIR (PAXLOVID)TABLET: 3.0000 | ORAL_TABLET | Freq: Two times a day (BID) | ORAL | 0 refills | Status: AC

## 2022-08-17 MED ORDER — IPRATROPIUM BROMIDE 0.03 % NA SOLN
2.0000 | Freq: Two times a day (BID) | NASAL | 12 refills | Status: DC
Start: 2022-08-17 — End: 2023-06-25

## 2022-08-17 NOTE — Progress Notes (Signed)
Since your COVID-19 symptoms started less than 5 days ago you may need a prescription for FDA-approved treatments (pills or IV therapy), which we cannot prescribe through an e-Visit. The best way for our providers to make a decision about which COVID treatment is right for you is through a Virtual Urgent Care Visit.  If you would like to discuss COVID therapy options with a provider, cancel this e-Visit and access a Virtual Urgent Care Visit from our Mychart menu.   You will not be charged for the e-Visit.   

## 2022-08-17 NOTE — Progress Notes (Unsigned)
Virtual Visit Consent   Jackie Lewis, you are scheduled for a virtual visit with a [facility] provider today. Just as with appointments in the office, your consent must be obtained to participate. Your consent will be active for this visit and any virtual visit you may have with one of our providers in the next 365 days. If you have a MyChart account, a copy of this consent can be sent to you electronically.  As this is a virtual visit, video technology does not allow for your provider to perform a traditional examination. This may limit your provider's ability to fully assess your condition. If your provider identifies any concerns that need to be evaluated in person or the need to arrange testing (such as labs, EKG, etc.), we will make arrangements to do so. Although advances in technology are sophisticated, we cannot ensure that it will always work on either your end or our end. If the connection with a video visit is poor, the visit may have to be switched to a telephone visit. With either a video or telephone visit, we are not always able to ensure that we have a secure connection.  By engaging in this virtual visit, you consent to the provision of healthcare and authorize for your insurance to be billed (if applicable) for the services provided during this visit. Depending on your insurance coverage, you may receive a charge related to this service.  I need to obtain your verbal consent now. Are you willing to proceed with your visit today? Jackie Lewis has provided verbal consent on 08/17/2022 for a virtual visit (video or telephone). [name], FNP  Date: 08/17/2022 8:17 PM  Virtual Visit via Video Note   I, [name], connected with  Jackie Lewis  ([id number], [birth year]) on 08/17/22 at  7:45 AM EDT by a video-enabled telemedicine application and verified that I am speaking with the correct person using two identifiers.  Location: Patient: Virtual Visit Location Patient: Home Provider:  Virtual Visit Location Provider: Home Office   I discussed the limitations of evaluation and management by telemedicine and the availability of in person appointments. The patient expressed understanding and agreed to proceed.    History of Present Illness:  Jackie Lewis is a 67 y.o. who identifies as a female who was assigned female at birth, and is being seen today after testing positive for COVID-19  Symptoms started yesterday and include mainly: Sore throat  Fatigue  Nasal congestion   She has not had COVID in the past  She has been vaccinated for COVID three times in the past    She has mild asthma has needed Albuterol in the past  She does have slight HTN just noted in the past month   GFR 79 (06/01/2022)   Problems:  Patient Active Problem List   Diagnosis Date Noted   Genetic testing 04/14/2021   Family history of gene mutation 03/30/2021   Family history of breast cancer 03/30/2021   Family history of uterine cancer 03/30/2021   Family history of prostate cancer in father 03/30/2021   Wheeze 06/14/2019   Influenza vaccination declined by patient 10/28/2013   Obesity (BMI 30-39.9) 09/02/2013   History of hyperglycemia 09/02/2013   Hearing loss 07/02/2012   Routine gynecological examination 07/01/2011   Shoulder joint pain, right 07/01/2011   Routine general medical examination at a health care facility 06/29/2010   Family health problem 06/29/2010   Hypothyroidism 01/14/2009   CARPAL TUNNEL SYNDROME, RIGHT 01/14/2009   HYPERGLYCEMIA 05/29/2008  DUODENITIS WITHOUT MENTION OF HEMORRHAGE 05/16/2007   OBESITY, UNSPECIFIED 03/06/2007   GERD 03/06/2007   MENOPAUSE-RELATED VASOMOTOR SYMPTOMS 03/06/2007   ARM PAIN, RIGHT 02/05/2007   ELEVATED BP READING WITHOUT DX HYPERTENSION 02/05/2007   ALLERGIC RHINITIS 09/21/2006    Allergies:  Allergies  Allergen Reactions   Epinephrine     REACTION: dental injection got tachycardia   Nitrofurantoin Monohyd Macro Rash    Medications:  Current Outpatient Medications:    albuterol (VENTOLIN HFA) 108 (90 Base) MCG/ACT inhaler, Inhale 1-2 puffs into the lungs every 6 (six) hours as needed for wheezing or shortness of breath., Disp: 18 g, Rfl: 1   calcium carbonate (OS-CAL) 1250 (500 Ca) MG chewable tablet, Chew 2 tablets by mouth daily., Disp: , Rfl:    Cholecalciferol (VITAMIN D3) 1000 units CAPS, Take 1 capsule by mouth daily., Disp: , Rfl:    fluticasone (FLONASE) 50 MCG/ACT nasal spray, SPRAY 2 SPRAYS INTO EACH NOSTRIL EVERY DAY (Patient not taking: Reported on 06/01/2022), Disp: 48 mL, Rfl: 3   ibuprofen (ADVIL,MOTRIN) 400 MG tablet, Take by mouth. USING PRN FOR KNEE PAIN, Disp: , Rfl:    levothyroxine (SYNTHROID) 88 MCG tablet, TAKE 1 TABLET BY MOUTH DAILY BEFORE BREAKFAST., Disp: 90 tablet, Rfl: 3   loratadine (CLARITIN) 10 MG tablet, Take 10 mg by mouth daily as needed for allergies., Disp: , Rfl:    SODIUM FLUORIDE 5000 SENSITIVE 1.1-5 % PSTE, Take by mouth as directed., Disp: , Rfl:    triamcinolone (KENALOG) 0.025 % cream, APPLY TO AFFECTED AREA TWICE DAILY AS DIRECTED, Disp: 80 g, Rfl: 1  Current Facility-Administered Medications:    0.9 %  sodium chloride infusion, 500 mL, Intravenous, Continuous, Pyrtle, Carie Caddy, MD  Observations/Objective: Patient is well-developed, well-nourished in no acute distress.  Resting comfortably  at home.  Head is normocephalic, atraumatic.  No labored breathing.  Speech is clear and coherent with logical content.  Patient is alert and oriented at baseline.    Assessment and Plan: 1. COVID-19  - nirmatrelvir/ritonavir (PAXLOVID) 20 x 150 MG & 10 x 100MG  TABS; Take 3 tablets by mouth 2 (two) times daily for 5 days. (Take nirmatrelvir 150 mg two tablets twice daily for 5 days and ritonavir 100 mg one tablet twice daily for 5 days) Patient GFR is 79  Dispense: 30 tablet; Refill: 0 - ipratropium (ATROVENT) 0.03 % nasal spray; Place 2 sprays into both nostrils every 12  (twelve) hours.  Dispense: 30 mL; Refill: 12 - benzonatate (TESSALON) 100 MG capsule; Take 1 capsule (100 mg total) by mouth 3 (three) times daily as needed.  Dispense: 30 capsule; Refill: 0     Follow Up Instructions: I discussed the assessment and treatment plan with the patient. The patient was provided an opportunity to ask questions and all were answered. The patient agreed with the plan and demonstrated an understanding of the instructions.  A copy of instructions were sent to the patient via MyChart unless otherwise noted below.    The patient was advised to call back or seek an in-person evaluation if the symptoms worsen or if the condition fails to improve as anticipated.  Time:  I spent 15 minutes with the patient via telehealth technology discussing the above problems/concerns.    [name], FNP

## 2022-08-17 NOTE — Patient Instructions (Signed)
Be sure to take Paxlovid with food, if this medicine makes you nauseated or not wanting to eat please stop the Paxlovid and schedule a follow up visit with a provider.

## 2022-08-18 ENCOUNTER — Telehealth: Payer: BC Managed Care – PPO | Admitting: Nurse Practitioner

## 2022-08-18 DIAGNOSIS — U071 COVID-19: Secondary | ICD-10-CM | POA: Diagnosis not present

## 2022-11-17 ENCOUNTER — Other Ambulatory Visit: Payer: Self-pay | Admitting: Internal Medicine

## 2022-12-26 ENCOUNTER — Telehealth: Payer: Self-pay

## 2022-12-26 NOTE — Telephone Encounter (Signed)
Spoke to pt to get some medicare questionnaire answers for tomorrow.   Was unable to complete it. Will complete the rest tomorrow.   Pt verbalized understanding.

## 2022-12-27 ENCOUNTER — Encounter: Payer: Self-pay | Admitting: Internal Medicine

## 2022-12-27 ENCOUNTER — Ambulatory Visit: Payer: BC Managed Care – PPO | Admitting: Internal Medicine

## 2022-12-27 ENCOUNTER — Other Ambulatory Visit: Payer: Self-pay | Admitting: Internal Medicine

## 2022-12-27 VITALS — BP 130/88 | HR 77 | Temp 98.0°F | Ht 61.0 in | Wt 206.8 lb

## 2022-12-27 DIAGNOSIS — E039 Hypothyroidism, unspecified: Secondary | ICD-10-CM

## 2022-12-27 DIAGNOSIS — Z79899 Other long term (current) drug therapy: Secondary | ICD-10-CM

## 2022-12-27 DIAGNOSIS — E669 Obesity, unspecified: Secondary | ICD-10-CM

## 2022-12-27 DIAGNOSIS — Z0000 Encounter for general adult medical examination without abnormal findings: Secondary | ICD-10-CM | POA: Diagnosis not present

## 2022-12-27 DIAGNOSIS — Z23 Encounter for immunization: Secondary | ICD-10-CM

## 2022-12-27 DIAGNOSIS — R7309 Other abnormal glucose: Secondary | ICD-10-CM

## 2022-12-27 DIAGNOSIS — R030 Elevated blood-pressure reading, without diagnosis of hypertension: Secondary | ICD-10-CM

## 2022-12-27 DIAGNOSIS — H7192 Unspecified cholesteatoma, left ear: Secondary | ICD-10-CM | POA: Insufficient documentation

## 2022-12-27 NOTE — Progress Notes (Signed)
Subjective:    Jackie Lewis is a 67 y.o. female who presents for a Welcome to Medicare exam.  Since last visit .  Doing well 3 weeks out from major ear surgery for  cholesteatoma  left  [facility]  doing ok  some hearing may come back . Rest of health stable   Vision  has cataract r eye has seen  opthalm  No cv pulm sx  no bleeding change in bowel habits         Objective:    Today's Vitals   12/27/22 1341  BP: 130/88  Pulse: 77  Temp: 98 F (36.7 C)  TempSrc: Oral  SpO2: 96%  Weight: 206 lb 12.8 oz (93.8 kg)  Height: 5\' 1"  (1.549 m)  Body mass index is 39.07 kg/m.  Medications Outpatient Encounter Medications as of 12/27/2022  Medication Sig   albuterol (VENTOLIN HFA) 108 (90 Base) MCG/ACT inhaler Inhale 1-2 puffs into the lungs every 6 (six) hours as needed for wheezing or shortness of breath.   benzonatate (TESSALON) 100 MG capsule Take 1 capsule (100 mg total) by mouth 3 (three) times daily as needed.   calcium carbonate (OS-CAL) 1250 (500 Ca) MG chewable tablet Chew 2 tablets by mouth daily.   Cholecalciferol (VITAMIN D3) 1000 units CAPS Take 1 capsule by mouth daily.   fluticasone (FLONASE) 50 MCG/ACT nasal spray SPRAY 2 SPRAYS INTO EACH NOSTRIL EVERY DAY   ibuprofen (ADVIL,MOTRIN) 400 MG tablet Take by mouth. USING PRN FOR KNEE PAIN   ipratropium (ATROVENT) 0.03 % nasal spray Place 2 sprays into both nostrils every 12 (twelve) hours.   levothyroxine (SYNTHROID) 88 MCG tablet TAKE 1 TABLET BY MOUTH DAILY BEFORE BREAKFAST.   loratadine (CLARITIN) 10 MG tablet Take 10 mg by mouth daily as needed for allergies.   SODIUM FLUORIDE 5000 SENSITIVE 1.1-5 % PSTE Take by mouth as directed.   triamcinolone (KENALOG) 0.025 % cream APPLY TO AFFECTED AREA TWICE A DAY AS DIRECTED   Facility-Administered Encounter Medications as of 12/27/2022  Medication   0.9 %  sodium chloride infusion     History: Past Medical History:  Diagnosis Date   Abnormal thyroid blood test     Allergic rhinitis    Allergy    Anxiety    Carpal tunnel    Bil   GERD (gastroesophageal reflux disease)    Headache(784.0)    Hearing loss    Bil/ worse on left side.   Recurrent UTI    Scleritis    hx   Thyroid disease    Past Surgical History:  Procedure Laterality Date   ESOPHAGOGASTRODUODENOSCOPY  3/09   EXCISION RADIAL HEAD  2015   right elbow   INNER EAR SURGERY     hole in right ear/ surg left ear 2-3 times   UPPER GASTROINTESTINAL ENDOSCOPY      Family History  Problem Relation Age of Onset   Stroke Father    Parkinsonism Father    Uterine cancer Sister 46   Heart disease Sister    Colon cancer Maternal Aunt    Breast cancer Paternal Aunt    Lung cancer Paternal Uncle        heavy smoker   Hemolytic uremic syndrome Daughter        atypical  under rx    Breast cancer Daughter 64       CHEK2+   Other Daughter        CHEK2+   Other Son  traumatic brain injury 2011   Breast cancer Cousin        paternal   Prostate cancer Cousin 50       maternal first cousin (also her husband), CHEK2+   Thyroid disease Other    Hypertension Other    Hyperlipidemia Other    Social History   Occupational History   Not on file  Tobacco Use   Smoking status: Never   Smokeless tobacco: Never  Vaping Use   Vaping status: Never Used  Substance and Sexual Activity   Alcohol use: No    Alcohol/week: 0.0 standard drinks of alcohol   Drug use: No   Sexual activity: Not Currently    Tobacco Counseling Counseling given: Not Answered   Immunizations and Health Maintenance Immunization History  Administered Date(s) Administered   Influenza Inj Mdck Quad Pf 12/15/2017   Influenza Whole 12/16/2003   Influenza,inj,Quad PF,6+ Mos 11/25/2015, 11/29/2016, 11/07/2018   Influenza-Unspecified 12/15/2016, 12/15/2017   PFIZER(Purple Top)SARS-COV-2 Vaccination 04/08/2019, 04/29/2019, 12/14/2019   PNEUMOCOCCAL CONJUGATE-20 12/27/2022   Td 05/29/2008   Tdap 06/15/2015    Zoster Recombinant(Shingrix) 02/24/2021   Health Maintenance Due  Topic Date Due   Colonoscopy  02/01/2021    Activities of Daily Living    12/26/2022    5:01 PM  In your present state of health, do you have any difficulty performing the following activities:  Hearing? 1  Comment pt reports she had major surgery on Left ear due to hearing loss and infection. on 12/05/2022.  Vision? 0  Difficulty concentrating or making decisions? 0  Walking or climbing stairs? 1  Comment yes with climbing stairs, sometimes.  Dressing or bathing? 0  Doing errands, shopping? 0  Preparing Food and eating ? N  Using the Toilet? N  In the past six months, have you accidently leaked urine? Y  Do you have problems with loss of bowel control? N  Managing your Medications? N  Managing your Finances? N  Housekeeping or managing your Housekeeping? N    Physical Exam   Physical Exam (optional), or other factors deemed appropriate based on the beneficiary's medical and social history and current clinical standards. Heent left ear with packing  looks well  Neck no masses  Chest cta  CV no g om s1 s2  no sig edema  Skin nl color    Advanced Directives: Does Patient Have a Medical Advance Directive?: No Would patient like information on creating a medical advance directive?: No - Patient declined  EKG:  normal EKG, normal sinus rhythm, unchanged from previous tracings, not indicated  see past hx and past in epic       Assessment:    This is a routine wellness examination for this patient .   Vision/Hearing screen Vision Screening   Right eye Left eye Both eyes  Without correction     With correction 20/40 20/25-1 20/25     Goals       Diet    l      Weight    Weight (lb) < 200 lb (90.7 kg)     Pt reports she would like to stay healthy: eating better; decrease calories intake, eating fruits and vegetables.        Depression Screen    12/26/2022    5:11 PM 06/01/2022    9:19 AM  02/24/2021    3:28 PM 02/24/2021    3:05 PM  PHQ 2/9 Scores  PHQ - 2 Score 0 0 0 6  PHQ- 9 Score  4 5 14      Fall Risk    12/27/2022    1:48 PM  Fall Risk   Falls in the past year? 1  Number falls in past yr: 0  Injury with Fall? 0  Risk for fall due to : Other (Comment)  Follow up Falls evaluation completed    Cognitive Function:        12/27/2022    1:50 PM  6CIT Screen  What Year? 0 points  What month? 0 points  What time? 0 points  Count back from 20 0 points  Months in reverse 2 points  Repeat phrase 10 points  Total Score 12 points    Patient Care Team: Aditri Louischarles, Neta Mends, MD as PCP - General ent ortho      Plan:   Bp monitoring and goals discussed  . Can be effected by pain and  medical  visits  Get baseline at home and plan fu cpe in April and labs   I have personally reviewed and noted the following in the patient's chart:   Medical and social history Use of alcohol, tobacco or illicit drugs  Current medications and supplements Functional ability and status Nutritional status Physical activity Advanced directives List of other physicians Hospitalizations, surgeries, and ER visits in previous 12 months Vitals Screenings to include cognitive, depression, and falls Referrals and appointments  In addition, I have reviewed and discussed with patient certain preventive protocols, quality metrics, and best practice recommendations. A written personalized care plan for preventive services as well as general preventive health recommendations were provided to patient.     Berniece Andreas, MD 12/27/2022

## 2022-12-27 NOTE — Patient Instructions (Addendum)
Good to see you today and the best in recovery from ear surgery.  Prevnar 20 today .  Continue lifestyle intervention healthy eating and exercise .  Next lab due in April or  May .  Make  appt in April May for appt for cpe and lab.  I will place future orders so you can get lab  pre visit   Jackie Lewis , Thank you for taking time to come for your Medicare Wellness Visit. I appreciate your ongoing commitment to your health goals. Please review the following plan we discussed and let me know if I can assist you in the future.   These are the goals we discussed:  Goals       Diet    l      Weight    Weight (lb) < 200 lb (90.7 kg)     Pt reports she would like to stay healthy: eating better; decrease calories intake, eating fruits and vegetables.         This is a list of the screening recommended for you and due dates:  Health Maintenance  Topic Date Due   Colon Cancer Screening  02/01/2021   COVID-19 Vaccine (4 - 2023-24 season) 01/12/2023*   Flu Shot  05/15/2023*   Zoster (Shingles) Vaccine (2 of 2) 06/26/2023*   Pneumonia Vaccine (1 of 1 - PCV) 12/27/2023*   Mammogram  07/24/2023   Medicare Annual Wellness Visit  12/27/2023   DTaP/Tdap/Td vaccine (3 - Td or Tdap) 06/14/2025   DEXA scan (bone density measurement)  Completed   Hepatitis C Screening  Completed   HPV Vaccine  Aged Out  *Topic was postponed. The date shown is not the original due date.

## 2022-12-27 NOTE — Progress Notes (Signed)
Future labs to be done before next cpe  around April 25

## 2023-02-27 ENCOUNTER — Ambulatory Visit (INDEPENDENT_AMBULATORY_CARE_PROVIDER_SITE_OTHER): Payer: Medicare Other | Admitting: Family Medicine

## 2023-02-27 ENCOUNTER — Encounter: Payer: Self-pay | Admitting: Internal Medicine

## 2023-02-27 ENCOUNTER — Encounter: Payer: Self-pay | Admitting: Family Medicine

## 2023-02-27 VITALS — BP 126/78 | HR 94 | Temp 98.1°F | Wt 207.0 lb

## 2023-02-27 DIAGNOSIS — R1010 Upper abdominal pain, unspecified: Secondary | ICD-10-CM | POA: Diagnosis not present

## 2023-02-27 LAB — POC URINALSYSI DIPSTICK (AUTOMATED)
Blood, UA: NEGATIVE
Glucose, UA: NEGATIVE
Ketones, UA: NEGATIVE
Leukocytes, UA: NEGATIVE
Nitrite, UA: NEGATIVE
Protein, UA: POSITIVE — AB
Spec Grav, UA: 1.02 (ref 1.010–1.025)
Urobilinogen, UA: 0.2 U/dL
pH, UA: 5.5 (ref 5.0–8.0)

## 2023-02-27 MED ORDER — OMEPRAZOLE 40 MG PO CPDR: 40.0000 mg | DELAYED_RELEASE_CAPSULE | Freq: Every morning | ORAL | 0 refills | Status: DC

## 2023-02-27 NOTE — Progress Notes (Signed)
   Subjective:    Patient ID: Jackie Lewis, female    DOB: 1955/10/31, 68 y.o.   MRN: [id number]  HPI Here for 2 episodes of abdominal pain in the past 6 days. The first one 6 days ago lasted about 6-7 hours before it went away. Then one 2 days ago lasted about one hour. Today she feels fine. No fever. No nausea or vomiting. Her BM's have been regular. No urinary symptoms. The pain was burning in nature, and it has been worse in the middle and upper abdomen. She has not taken anything for this. Of note, she was treated for GERD and duodenitis in 2009, and she had an H pylori infection at that time. She never takes any sort of antacid.    Review of Systems  Constitutional: Negative.   Respiratory: Negative.    Cardiovascular: Negative.   Gastrointestinal:  Positive for abdominal pain. Negative for abdominal distention, blood in stool, constipation, diarrhea, nausea and vomiting.  Genitourinary: Negative.        Objective:   Physical Exam Constitutional:      Appearance: Normal appearance. She is not ill-appearing.  Cardiovascular:     Rate and Rhythm: Normal rate and regular rhythm.     Pulses: Normal pulses.     Heart sounds: Normal heart sounds.  Pulmonary:     Effort: Pulmonary effort is normal.     Breath sounds: Normal breath sounds.  Abdominal:     General: Abdomen is flat. Bowel sounds are normal. There is no distension.     Palpations: Abdomen is soft. There is no mass.     Tenderness: There is no right CVA tenderness, left CVA tenderness, guarding or rebound.     Hernia: No hernia is present.     Comments: She is mildly tender in the epigastrium and in the middle abdomen   Neurological:     Mental Status: She is alert.           Assessment & Plan:  This is likely due to GERD. She will start taking Omeprazole  40 mg every morning. Follow up with Dr. Charlett in 3-4 weeks. Garnette Olmsted, MD

## 2023-02-27 NOTE — Addendum Note (Signed)
 Addended by: Carola Rhine on: 02/27/2023 11:46 AM   Modules accepted: Orders

## 2023-03-16 ENCOUNTER — Encounter: Payer: Self-pay | Admitting: Internal Medicine

## 2023-03-16 ENCOUNTER — Ambulatory Visit (INDEPENDENT_AMBULATORY_CARE_PROVIDER_SITE_OTHER): Payer: 59 | Admitting: Internal Medicine

## 2023-03-16 VITALS — BP 130/80 | HR 80 | Temp 97.8°F | Ht 61.0 in | Wt 209.2 lb

## 2023-03-16 DIAGNOSIS — R1010 Upper abdominal pain, unspecified: Secondary | ICD-10-CM

## 2023-03-16 DIAGNOSIS — R142 Eructation: Secondary | ICD-10-CM

## 2023-03-16 DIAGNOSIS — Z79899 Other long term (current) drug therapy: Secondary | ICD-10-CM

## 2023-03-16 DIAGNOSIS — E039 Hypothyroidism, unspecified: Secondary | ICD-10-CM | POA: Diagnosis not present

## 2023-03-16 DIAGNOSIS — R1084 Generalized abdominal pain: Secondary | ICD-10-CM

## 2023-03-16 LAB — BASIC METABOLIC PANEL
BUN: 15 mg/dL (ref 6–23)
CO2: 28 meq/L (ref 19–32)
Calcium: 9.5 mg/dL (ref 8.4–10.5)
Chloride: 103 meq/L (ref 96–112)
Creatinine, Ser: 0.65 mg/dL (ref 0.40–1.20)
GFR: 90.16 mL/min (ref >60.00)
Glucose, Bld: 92 mg/dL (ref 70–99)
Potassium: 4 meq/L (ref 3.5–5.1)
Sodium: 139 meq/L (ref 135–145)

## 2023-03-16 LAB — HEPATIC FUNCTION PANEL
ALT: 21 U/L (ref 0–35)
AST: 19 U/L (ref 0–37)
Albumin: 4.1 g/dL (ref 3.5–5.2)
Alkaline Phosphatase: 70 U/L (ref 39–117)
Bilirubin, Direct: 0.1 mg/dL (ref 0.0–0.3)
Total Bilirubin: 0.4 mg/dL (ref 0.2–1.2)
Total Protein: 7.7 g/dL (ref 6.0–8.3)

## 2023-03-16 LAB — POCT URINALYSIS DIPSTICK
Bilirubin, UA: NEGATIVE
Blood, UA: NEGATIVE
Glucose, UA: NEGATIVE
Ketones, UA: NEGATIVE
Leukocytes, UA: NEGATIVE
Nitrite, UA: NEGATIVE
Protein, UA: NEGATIVE
Spec Grav, UA: 1.02 (ref 1.010–1.025)
Urobilinogen, UA: 0.2 U/dL
pH, UA: 7 (ref 5.0–8.0)

## 2023-03-16 LAB — CBC WITH DIFFERENTIAL/PLATELET
Basophils Absolute: 0 10*3/uL (ref 0.0–0.1)
Basophils Relative: 0.6 % (ref 0.0–3.0)
Eosinophils Absolute: 0.4 10*3/uL (ref 0.0–0.7)
Eosinophils Relative: 5.9 % — ABNORMAL HIGH (ref 0.0–5.0)
HCT: 37.7 % (ref 36.0–46.0)
Hemoglobin: 12.5 g/dL (ref 12.0–15.0)
Lymphocytes Relative: 32.8 % (ref 12.0–46.0)
Lymphs Abs: 2 10*3/uL (ref 0.7–4.0)
MCHC: 33.1 g/dL (ref 30.0–36.0)
MCV: 83.8 fL (ref 78.0–100.0)
Monocytes Absolute: 0.4 10*3/uL (ref 0.1–1.0)
Monocytes Relative: 6.7 % (ref 3.0–12.0)
Neutro Abs: 3.3 10*3/uL (ref 1.4–7.7)
Neutrophils Relative %: 54 % (ref 43.0–77.0)
Platelets: 236 10*3/uL (ref 150.0–400.0)
RBC: 4.5 Mil/uL (ref 3.87–5.11)
RDW: 15.6 % — ABNORMAL HIGH (ref 11.5–15.5)
WBC: 6.2 10*3/uL (ref 4.0–10.5)

## 2023-03-16 LAB — LIPID PANEL
Cholesterol: 185 mg/dL (ref 0–200)
HDL: 62.3 mg/dL (ref >39.00)
LDL Cholesterol: 101 mg/dL — ABNORMAL HIGH (ref 0–99)
NonHDL: 123.02
Total CHOL/HDL Ratio: 3
Triglycerides: 112 mg/dL (ref 0.0–149.0)
VLDL: 22.4 mg/dL (ref 0.0–40.0)

## 2023-03-16 LAB — HEMOGLOBIN A1C: Hgb A1c MFr Bld: 5.9 % (ref 4.6–6.5)

## 2023-03-16 LAB — MICROALBUMIN / CREATININE URINE RATIO
Creatinine,U: 97.7 mg/dL
Microalb Creat Ratio: 1.1 mg/g (ref 0.0–30.0)
Microalb, Ur: 1 mg/dL (ref 0.0–1.9)

## 2023-03-16 LAB — TSH: TSH: 5.98 u[IU]/mL — ABNORMAL HIGH (ref 0.35–5.50)

## 2023-03-16 NOTE — Patient Instructions (Signed)
Urine is clear today but sending for protein screen. Lab work  Will order abd scan ct because of generalized pain . Could be gall bladder  althought that is usually upper abdomen.  In interim take the omeprazole  in case  stomach related  problem such as  ulcer  etc.   Depending we may get you to the [facility] team for further assessment

## 2023-03-16 NOTE — Progress Notes (Signed)
Chief Complaint  Patient presents with   Follow-up    Pt reports she is follow up from last visit with Dr. Clent Ridges for Abdomen pain. Pt reports she feels uncomfortable on her abdomen along with burping and passing gas.    Skin Concerns    Pt reports skin concerns on her R check. She states it is itchy sometimes. Noticed it about 2 months ago.     HPI: Jackie Lewis 68 y.o. come in for  see message  from 1 13   Episodic  severe abd [pain one lasted 7 hours  and awoke her from sleep   ( was very cold weather days [place] so didn't go to the ED)   no vomiting  but  Took 2 days  of omeprazole an didn't  think it was a stomach acid problem so stopped .  Hx of 3 x episodes . Concern could come back    no hx of hematuria renal stones . Pain was lower and upper abd bilateral  and severe  couldn't get comfortable.  ROS: See pertinent positives and negatives per HPI.  Past Medical History:  Diagnosis Date   Abnormal thyroid blood test    Allergic rhinitis    Allergy    Anxiety    Carpal tunnel    Bil   GERD (gastroesophageal reflux disease)    Headache(784.0)    Hearing loss    Bil/ worse on left side.   Recurrent UTI    Scleritis    hx   Thyroid disease     Family History  Problem Relation Age of Onset   Stroke Father    Parkinsonism Father    Uterine cancer Sister 56   Heart disease Sister    Colon cancer Maternal Aunt    Breast cancer Paternal Aunt    Lung cancer Paternal Uncle        heavy smoker   Hemolytic uremic syndrome Daughter        atypical  under rx    Breast cancer Daughter 59       CHEK2+   Other Daughter        CHEK2+   Other Son        traumatic brain injury 2011   Breast cancer Cousin        paternal   Prostate cancer Cousin 58       maternal first cousin (also her husband), CHEK2+   Thyroid disease Other    Hypertension Other    Hyperlipidemia Other     Social History   Socioeconomic History   Marital status: Married    Spouse name: Not on  file   Number of children: Not on file   Years of education: Not on file   Highest education level: Not on file  Occupational History   Not on file  Tobacco Use   Smoking status: Never   Smokeless tobacco: Never  Vaping Use   Vaping status: Never Used  Substance and Sexual Activity   Alcohol use: No    Alcohol/week: 0.0 standard drinks of alcohol   Drug use: No   Sexual activity: Not Currently  Other Topics Concern   Not on file  Social History Narrative   Married hh of 3-   Regular exercise- no   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  [place] country of origin   Bereaved parent   Son  speaking some  Walks feeds sabnd dressed self  Can go to gym for som exercise      Hh  of 2 . Daughter graduated from [employer]  grad school in [facility]   Got hus living in [place]   Daughter   In [place]   [place]   Social Drivers of Health   Financial Resource Strain: Low Risk  (06/01/2022)   Overall Financial Resource Strain (CARDIA)    Difficulty of Paying Living Expenses: Not very hard  Food Insecurity: No Food Insecurity (06/01/2022)   Hunger Vital Sign    Worried About Running Out of Food in the Last Year: Never true    Ran Out of Food in the Last Year: Never true  Transportation Needs: No Transportation Needs (06/01/2022)   PRAPARE - Administrator, Civil Service (Medical): No    Lack of Transportation (Non-Medical): No  Physical Activity: Inactive (12/26/2022)   Exercise Vital Sign    Days of Exercise per Week: 0 days    Minutes of Exercise per Session: 0 min  Stress: No Stress Concern Present (12/26/2022)   [employer] of Occupational Health - Occupational Stress Questionnaire    Feeling of Stress : Only a little  Social Connections: Moderately Isolated (12/26/2022)   Social Connection and Isolation Panel [NHANES]    Frequency of Communication with  Friends and Family: More than three times a week    Frequency of Social Gatherings with Friends and Family: Twice a week    Attends Religious Services: Never    Database administrator or Organizations: No    Attends Engineer, structural: Never    Marital Status: Married    Outpatient Medications Prior to Visit  Medication Sig Dispense Refill   albuterol (VENTOLIN HFA) 108 (90 Base) MCG/ACT inhaler Inhale 1-2 puffs into the lungs every 6 (six) hours as needed for wheezing or shortness of breath. 18 g 1   Cholecalciferol (VITAMIN D3) 1000 units CAPS Take 1 capsule by mouth daily.     fluticasone (FLONASE) 50 MCG/ACT nasal spray SPRAY 2 SPRAYS INTO EACH NOSTRIL EVERY DAY (Patient taking differently: as needed.) 48 mL 3   ibuprofen (ADVIL,MOTRIN) 400 MG tablet Take by mouth. USING PRN FOR KNEE PAIN     ipratropium (ATROVENT) 0.03 % nasal spray Place 2 sprays into both nostrils every 12 (twelve) hours. 30 mL 12   levothyroxine (SYNTHROID) 88 MCG tablet TAKE 1 TABLET BY MOUTH DAILY BEFORE BREAKFAST. 90 tablet 3   loratadine (CLARITIN) 10 MG tablet Take 10 mg by mouth daily as needed for allergies.     SODIUM FLUORIDE 5000 SENSITIVE 1.1-5 % PSTE Take by mouth as directed.     triamcinolone (KENALOG) 0.025 % cream APPLY TO AFFECTED AREA TWICE A DAY AS DIRECTED 80 g 1   benzonatate (TESSALON) 100 MG capsule Take 1 capsule (100 mg total) by mouth 3 (three) times daily as needed. (Patient not taking: Reported on 03/16/2023) 30 capsule 0   calcium carbonate (OS-CAL) 1250 (500 Ca) MG chewable tablet Chew 2 tablets by mouth daily. (Patient not taking: Reported on 03/16/2023)     omeprazole (PRILOSEC) 40 MG capsule Take 1 capsule (40 mg total) by mouth in the morning. (Patient not taking: Reported on 03/16/2023) 30 capsule 0   Facility-Administered Medications Prior to Visit  Medication Dose Route Frequency Provider Last Rate Last Admin   0.9 %  sodium chloride infusion  500 mL Intravenous Continuous  Pyrtle, Carie Caddy, MD         EXAM:  BP 130/80 (BP Location: Left Arm, Patient Position: Sitting, Cuff Size: Large)   Pulse 80   Temp 97.8 F (36.6 C) (Oral)   Ht 5\' 1"  (1.549 m)   Wt 209 lb 3.2 oz (94.9 kg)   SpO2 98%   BMI 39.53 kg/m   Body mass index is 39.53 kg/m.  GENERAL: vitals reviewed and listed above, alert, oriented, appears well hydrated and in no acute distress non iceric  HEENT: atraumatic, conjunctiva  clear, no obvious abnormalities on inspection of external nose and ears NECK: no obvious masses on inspection palpation  Abdomen:  Sof,t normal bowel sounds without hepatosplenomegaly, no guarding rebound or masses no CVA tenderness CV: HRRR, no clubbing cyanosis or  peripheral edema nl cap refill  MS: moves all extremities without noticeable focal  abnormality PSYCH: pleasant and cooperative, no obvious depression or anxiety Lab Results  Component Value Date   WBC 6.9 06/01/2022   HGB 12.5 06/01/2022   HCT 37.6 06/01/2022   PLT 231.0 06/01/2022   GLUCOSE 98 06/01/2022   CHOL 176 06/01/2022   TRIG 93.0 06/01/2022   HDL 73.20 06/01/2022   LDLDIRECT 113.9 06/22/2010   LDLCALC 84 06/01/2022   ALT 22 06/01/2022   AST 20 06/01/2022   NA 140 06/01/2022   K 3.8 06/01/2022   CL 103 06/01/2022   CREATININE 0.77 06/01/2022   BUN 20 06/01/2022   CO2 27 06/01/2022   TSH 3.08 06/01/2022   HGBA1C 5.8 06/01/2022   MICROALBUR 0.7 04/14/2006   BP Readings from Last 3 Encounters:  03/16/23 130/80  02/27/23 126/78  12/27/22 130/88    ASSESSMENT AND PLAN:  Discussed the following assessment and plan:  Intermittent generalized abdominal pain - episodic severe generalized burping in between consider gb other past hx of hpylori but not typical - Plan: Basic metabolic panel, CBC with Differential/Platelet, Hemoglobin A1c, Hepatic function panel, Lipid panel, TSH, Microalbumin / creatinine urine ratio, CT ABDOMEN PELVIS W CONTRAST  Pain of upper abdomen  Medication  management - Plan: Basic metabolic panel, CBC with Differential/Platelet, Hemoglobin A1c, Hepatic function panel, Lipid panel, TSH, Microalbumin / creatinine urine ratio  Hypothyroidism, unspecified type - Plan: Basic metabolic panel, CBC with Differential/Platelet, Hemoglobin A1c, Hepatic function panel, Lipid panel, TSH, Microalbumin / creatinine urine ratio  Burping - Plan: Basic metabolic panel, CBC with Differential/Platelet, Hemoglobin A1c, Hepatic function panel, Lipid panel, TSH, Microalbumin / creatinine urine ratio, CT ABDOMEN PELVIS W CONTRAST Pattern then could be billiary but  location atypical   Severity is significant and off and on.  No change in bowel except"gas"  Past hx of h pylori   If lab and scan unrevealing  will get gi involved or as indicated. Did not address skin issue today  Updated other  labs due soon.  She is fasting today .  -Patient advised to return or notify health care team  if  new concerns arise.  Patient Instructions  Urine is clear today but sending for protein screen. Lab work  Will order abd scan ct because of generalized pain . Could be gall bladder  althought that is usually upper abdomen.  In interim take the omeprazole  in case  stomach related  problem such as  ulcer  etc.   Depending we may get you to the [facility] team for further assessment    Burna Mortimer K. Chandelle Harkey M.D.

## 2023-03-17 ENCOUNTER — Encounter (HOSPITAL_COMMUNITY): Payer: Self-pay

## 2023-03-17 ENCOUNTER — Other Ambulatory Visit: Payer: Self-pay

## 2023-03-17 ENCOUNTER — Other Ambulatory Visit (HOSPITAL_COMMUNITY): Payer: Self-pay

## 2023-03-17 ENCOUNTER — Encounter: Payer: Self-pay | Admitting: Internal Medicine

## 2023-03-17 ENCOUNTER — Ambulatory Visit (HOSPITAL_COMMUNITY)
Admission: RE | Admit: 2023-03-17 | Discharge: 2023-03-17 | Disposition: A | Discharge status: Home / Self Care | Payer: 59 | Source: Ambulatory Visit | Attending: Internal Medicine | Admitting: Internal Medicine

## 2023-03-17 DIAGNOSIS — R1084 Generalized abdominal pain: Secondary | ICD-10-CM | POA: Diagnosis present

## 2023-03-17 DIAGNOSIS — R142 Eructation: Secondary | ICD-10-CM | POA: Insufficient documentation

## 2023-03-17 DIAGNOSIS — E039 Hypothyroidism, unspecified: Secondary | ICD-10-CM

## 2023-03-17 MED ORDER — IOHEXOL 300 MG/ML  SOLN
100.0000 mL | Freq: Once | INTRAMUSCULAR | Status: AC | PRN
  Administered 2023-03-17: 100 mL via INTRAVENOUS

## 2023-03-17 NOTE — Progress Notes (Signed)
Abdominal ct scan is normal which is reassuring but doesn't tell us what is causing your pain  episodes .    Please do referral to Gastroenterology  for evaluation of recurrent abdominal pain.

## 2023-03-17 NOTE — Progress Notes (Signed)
 Lab orders are placed.

## 2023-03-17 NOTE — Progress Notes (Signed)
Blood results  ok except thyroid level is off some  may not be causing you any  symptoms at this time  make sure not missing medication and we should repeat  tsh level at your cpe visit. In APril  ( please order tsh and free t4 future labs  do not need to fast)

## 2023-03-21 ENCOUNTER — Other Ambulatory Visit: Payer: Self-pay | Admitting: Family Medicine

## 2023-03-28 ENCOUNTER — Ambulatory Visit: Payer: Medicare Other | Admitting: Internal Medicine

## 2023-05-08 ENCOUNTER — Ambulatory Visit: Payer: Self-pay

## 2023-05-08 NOTE — Telephone Encounter (Signed)
 Copied from CRM [id number]. Topic: Clinical - Red Word Triage >> May 08, 2023  1:47 PM Gurney Maxin H wrote: [facility] that prompted transfer to Nurse Triage: Patient having pain in stomach.   Chief Complaint: Abdominal pain  Symptoms: Abdominal pain, back pain  Frequency: Intermittent  Disposition: [] ED /[] Urgent Care (no appt availability in office) / [x] Appointment(In office/virtual)/ []  [facility]/ [] Home Care/ [] Refused Recommended Disposition /[] [facility]/ []  Follow-up with PCP Additional Notes: Patient reports she has been experiencing mid abdominal pain since December. She states that her pain is intermittent and sometimes radiates to her back. She denies any other symptoms at this time. She states that she was referred to GI but has not heard anything back yet. Appointment made for tomorrow for further evaluation of her symptoms.     Reason for Disposition  Age > 60 years  Answer Assessment - Initial Assessment Questions 1. LOCATION: "Where does it hurt?"      Mid-abdomen  2. RADIATION: "Does the pain shoot anywhere else?" (e.g., chest, back)     No 3. ONSET: "When did the pain begin?" (e.g., minutes, hours or days ago)      Since December   4. SUDDEN: "Gradual or sudden onset?"     Slowly  5. PATTERN "Does the pain come and go, or is it constant?"    - If it comes and goes: "How long does it last?" "Do you have pain now?"     (Note: Comes and goes means the pain is intermittent. It goes away completely between bouts.)    - If constant: "Is it getting better, staying the same, or getting worse?"      (Note: Constant means the pain never goes away completely; most serious pain is constant and gets worse.)      Intermittent  6. SEVERITY: "How bad is the pain?"  (e.g., Scale 1-10; mild, moderate, or severe)    - MILD (1-3): Doesn't interfere with normal activities, abdomen soft and not tender to touch.     - MODERATE (4-7): Interferes with normal activities or  awakens from sleep, abdomen tender to touch.     - SEVERE (8-10): Excruciating pain, doubled over, unable to do any normal activities.       6/10 7. RECURRENT SYMPTOM: "Have you ever had this type of stomach pain before?" If Yes, ask: "When was the last time?" and "What happened that time?"      Going on since December  8. CAUSE: "What do you think is causing the stomach pain?"     Unsure  9. RELIEVING/AGGRAVATING FACTORS: "What makes it better or worse?" (e.g., antacids, bending or twisting motion, bowel movement)     No 10. OTHER SYMPTOMS: "Do you have any other symptoms?" (e.g., back pain, diarrhea, fever, urination pain, vomiting)       "A little back pain"  Protocols used: Abdominal Pain - Female-A-AH

## 2023-05-09 ENCOUNTER — Telehealth (INDEPENDENT_AMBULATORY_CARE_PROVIDER_SITE_OTHER): Admitting: Internal Medicine

## 2023-05-09 ENCOUNTER — Ambulatory Visit: Admitting: Internal Medicine

## 2023-05-09 ENCOUNTER — Encounter: Payer: Self-pay | Admitting: Internal Medicine

## 2023-05-09 DIAGNOSIS — R1033 Periumbilical pain: Secondary | ICD-10-CM | POA: Diagnosis not present

## 2023-05-09 DIAGNOSIS — R112 Nausea with vomiting, unspecified: Secondary | ICD-10-CM

## 2023-05-09 DIAGNOSIS — Z8619 Personal history of other infectious and parasitic diseases: Secondary | ICD-10-CM | POA: Diagnosis not present

## 2023-05-09 DIAGNOSIS — R1084 Generalized abdominal pain: Secondary | ICD-10-CM

## 2023-05-09 DIAGNOSIS — E039 Hypothyroidism, unspecified: Secondary | ICD-10-CM

## 2023-05-09 NOTE — Telephone Encounter (Signed)
 Pt had a virtual visit today with Dr. Fabian Sharp.

## 2023-05-09 NOTE — Progress Notes (Signed)
 Virtual Visit via Video Note  I connected with Jackie Lewis on 05/09/23 at  3:00 PM EDT by a video enabled telemedicine application and verified that I am speaking with the correct person using two identifiers. Location patient: home Location provider:work office Persons participating in the virtual visit: patient, provider and spouse    Patient aware  of the limitations of evaluation and management by telemedicine and  availability of in person appointments. and agreed to proceed.   HPI: Jackie Lewis presents for video visit   the intermittent periumbilical to lower abd pain  has worsened more frequent and severe  sometimes assoxc with NV   . Never heard about Gi appt ( and referral apparently  was lost to dead end in system  )   yesterday went to [facility] but since thye were busy and ordered no lab  and ct update   no other eval but visit was done .   She is concerned as the severity of sx worsening . Though about constipation and took a ? Laxative ?    Had some NV  now taking light diet to prevent futre flare . NO fever .   Urine was dark but not checked in ed .   ROS: See pertinent positives and negatives per HPI.  Past Medical History:  Diagnosis Date   Abnormal thyroid blood test    Allergic rhinitis    Allergy    Anxiety    Carpal tunnel    Bil   GERD (gastroesophageal reflux disease)    Headache(784.0)    Hearing loss    Bil/ worse on left side.   Recurrent UTI    Scleritis    hx   Thyroid disease     Past Surgical History:  Procedure Laterality Date   ESOPHAGOGASTRODUODENOSCOPY  3/09   EXCISION RADIAL HEAD  2015   right elbow   INNER EAR SURGERY     hole in right ear/ surg left ear 2-3 times   UPPER GASTROINTESTINAL ENDOSCOPY      Family History  Problem Relation Age of Onset   Stroke Father    Parkinsonism Father    Uterine cancer Sister 39   Heart disease Sister    Colon cancer Maternal Aunt    Breast cancer Paternal Aunt    Lung cancer Paternal Uncle         heavy smoker   Hemolytic uremic syndrome Daughter        atypical  under rx    Breast cancer Daughter 71       CHEK2+   Other Daughter        CHEK2+   Other Son        traumatic brain injury 2011   Breast cancer Cousin        paternal   Prostate cancer Cousin 80       maternal first cousin (also her husband), CHEK2+   Thyroid disease Other    Hypertension Other    Hyperlipidemia Other     Social History   Tobacco Use   Smoking status: Never   Smokeless tobacco: Never  Vaping Use   Vaping status: Never Used  Substance Use Topics   Alcohol use: No    Alcohol/week: 0.0 standard drinks of alcohol   Drug use: No      Current Outpatient Medications:    Cholecalciferol (VITAMIN D3) 1000 units CAPS, Take 1 capsule by mouth daily., Disp: , Rfl:    fluticasone (FLONASE) 50  MCG/ACT nasal spray, SPRAY 2 SPRAYS INTO EACH NOSTRIL EVERY DAY (Patient taking differently: as needed.), Disp: 48 mL, Rfl: 3   ipratropium (ATROVENT) 0.03 % nasal spray, Place 2 sprays into both nostrils every 12 (twelve) hours., Disp: 30 mL, Rfl: 12   levothyroxine (SYNTHROID) 88 MCG tablet, TAKE 1 TABLET BY MOUTH DAILY BEFORE BREAKFAST., Disp: 90 tablet, Rfl: 3   loratadine (CLARITIN) 10 MG tablet, Take 10 mg by mouth daily as needed for allergies., Disp: , Rfl:    omeprazole (PRILOSEC) 40 MG capsule, TAKE 1 CAPSULE (40 MG TOTAL) BY MOUTH IN THE MORNING, Disp: 90 capsule, Rfl: 1   triamcinolone (KENALOG) 0.025 % cream, APPLY TO AFFECTED AREA TWICE A DAY AS DIRECTED, Disp: 80 g, Rfl: 1   albuterol (VENTOLIN HFA) 108 (90 Base) MCG/ACT inhaler, Inhale 1-2 puffs into the lungs every 6 (six) hours as needed for wheezing or shortness of breath. (Patient not taking: Reported on 05/09/2023), Disp: 18 g, Rfl: 1   benzonatate (TESSALON) 100 MG capsule, Take 1 capsule (100 mg total) by mouth 3 (three) times daily as needed. (Patient not taking: Reported on 05/09/2023), Disp: 30 capsule, Rfl: 0   calcium carbonate  (OS-CAL) 1250 (500 Ca) MG chewable tablet, Chew 2 tablets by mouth daily. (Patient not taking: Reported on 05/09/2023), Disp: , Rfl:    ibuprofen (ADVIL,MOTRIN) 400 MG tablet, Take by mouth. USING PRN FOR KNEE PAIN (Patient not taking: Reported on 05/09/2023), Disp: , Rfl:    SODIUM FLUORIDE 5000 SENSITIVE 1.1-5 % PSTE, Take by mouth as directed. (Patient not taking: Reported on 05/09/2023), Disp: , Rfl:   Current Facility-Administered Medications:    0.9 %  sodium chloride infusion, 500 mL, Intravenous, Continuous, Pyrtle, Carie Caddy, MD  EXAM: BP Readings from Last 3 Encounters:  03/16/23 130/80  02/27/23 126/78  12/27/22 130/88    VITALS per patient if applicable:  GENERAL: alert, oriented, appears  no ntoxic  HEENT: atraumatic, conjunttiva clear, no obvious abnormalities on inspection of external nose and ears  NECK: normal movements of the head and neck  LUNGS: on inspection no signs of respiratory distress, breathing rate appears normal, no obvious gross SOB, gasping or wheezing  CV: no obvious cyanosis  MS: moves all visible extremities without noticeable abnormality  PSYCH/NEURO: no obvious depression or anxiety, speech and thought processing grossly intact Lab Results  Component Value Date   WBC 6.2 03/16/2023   HGB 12.5 03/16/2023   HCT 37.7 03/16/2023   PLT 236.0 03/16/2023   GLUCOSE 92 03/16/2023   CHOL 185 03/16/2023   TRIG 112.0 03/16/2023   HDL 62.30 03/16/2023   LDLDIRECT 113.9 06/22/2010   LDLCALC 101 (H) 03/16/2023   ALT 21 03/16/2023   AST 19 03/16/2023   NA 139 03/16/2023   K 4.0 03/16/2023   CL 103 03/16/2023   CREATININE 0.65 03/16/2023   BUN 15 03/16/2023   CO2 28 03/16/2023   TSH 5.98 (H) 03/16/2023   HGBA1C 5.9 03/16/2023   MICROALBUR 1.0 03/16/2023  Record revierw  dx was felt to be chromic problem and no need for ct  etc   in evaluation   no ua  or  blood eval was done.      ASSESSMENT AND PLAN:  Discussed the following assessment and  plan:    ICD-10-CM   1. Abdominal pain, acute, periumbilical  R10.33 Basic metabolic panel    CBC with Differential/Platelet    Hepatic function panel    Sedimentation rate  C-reactive protein    Urinalysis, Routine w reflex microscopic    Ambulatory referral to Gastroenterology    H. pylori antigen, stool    2. Nausea and vomiting in adult  R11.2 Ambulatory referral to Gastroenterology    H. pylori antigen, stool   when pain occurrs    3. Intermittent periumbilical  abdominal pain  R10.84 Ambulatory referral to Gastroenterology    4. History of Helicobacter pylori infection  Z86.19 Ambulatory referral to Gastroenterology    H. pylori antigen, stool    5. Hypothyroidism, unspecified type  E03.9 TSH    Concerning progression more severe and this is new in a  68 yo lady .   Sounds visceral but unclear etiology update lab at our facility. Make appt  will update tsh  also  Will ofer h pylori stool but may n ot be accurate or immediate results   Agree with update GI referral asap since  worsening and last refferel didn't happen.  Prefer to go to [facility] since other family go there.  Last ct no diverticulitis and  presentation was atypical of such on eval in Jan .  Although  always possible I gues .  Hx of h pyloris she wonders if returned after rx  in past Counseled.   Expectant management and discussion of plan and treatment with opportunity to ask questions and all were answered. The patient agreed with the plan and demonstrated an understanding of the instructions.   Advised to call back or seek an in-person evaluation if worsening  or having  further concerns  in interim. Return for depending on results and how doing .    Berniece Andreas, MD

## 2023-05-10 ENCOUNTER — Other Ambulatory Visit (INDEPENDENT_AMBULATORY_CARE_PROVIDER_SITE_OTHER)

## 2023-05-10 DIAGNOSIS — R1033 Periumbilical pain: Secondary | ICD-10-CM

## 2023-05-10 DIAGNOSIS — E039 Hypothyroidism, unspecified: Secondary | ICD-10-CM

## 2023-05-10 LAB — BASIC METABOLIC PANEL WITH GFR
BUN: 14 mg/dL (ref 6–23)
CO2: 26 meq/L (ref 19–32)
Calcium: 9.1 mg/dL (ref 8.4–10.5)
Chloride: 100 meq/L (ref 96–112)
Creatinine, Ser: 0.73 mg/dL (ref 0.40–1.20)
GFR: 84.12 mL/min (ref >60.00)
Glucose, Bld: 147 mg/dL — ABNORMAL HIGH (ref 70–99)
Potassium: 3.8 meq/L (ref 3.5–5.1)
Sodium: 135 meq/L (ref 135–145)

## 2023-05-10 LAB — URINALYSIS, ROUTINE W REFLEX MICROSCOPIC
Hgb urine dipstick: NEGATIVE
Leukocytes,Ua: NEGATIVE
Nitrite: NEGATIVE
RBC / HPF: NONE SEEN (ref 0–?)
Specific Gravity, Urine: 1.02 (ref 1.000–1.030)
Total Protein, Urine: 30 — AB
Urine Glucose: NEGATIVE
Urobilinogen, UA: 1 (ref 0.0–1.0)
pH: 6 (ref 5.0–8.0)

## 2023-05-10 LAB — CBC WITH DIFFERENTIAL/PLATELET
Basophils Absolute: 0 10*3/uL (ref 0.0–0.1)
Basophils Relative: 0.2 % (ref 0.0–3.0)
Eosinophils Absolute: 0.1 10*3/uL (ref 0.0–0.7)
Eosinophils Relative: 0.7 % (ref 0.0–5.0)
HCT: 41.1 % (ref 36.0–46.0)
Hemoglobin: 13.4 g/dL (ref 12.0–15.0)
Lymphocytes Relative: 10.6 % — ABNORMAL LOW (ref 12.0–46.0)
Lymphs Abs: 1.5 10*3/uL (ref 0.7–4.0)
MCHC: 32.5 g/dL (ref 30.0–36.0)
MCV: 84.3 fl (ref 78.0–100.0)
Monocytes Absolute: 0.7 10*3/uL (ref 0.1–1.0)
Monocytes Relative: 5.1 % (ref 3.0–12.0)
Neutro Abs: 12 10*3/uL — ABNORMAL HIGH (ref 1.4–7.7)
Neutrophils Relative %: 83.4 % — ABNORMAL HIGH (ref 43.0–77.0)
Platelets: 243 10*3/uL (ref 150.0–400.0)
RBC: 4.88 Mil/uL (ref 3.87–5.11)
RDW: 15.6 % — ABNORMAL HIGH (ref 11.5–15.5)
WBC: 14.3 10*3/uL — ABNORMAL HIGH (ref 4.0–10.5)

## 2023-05-10 LAB — HEPATIC FUNCTION PANEL
ALT: 277 U/L — ABNORMAL HIGH (ref 0–35)
AST: 92 U/L — ABNORMAL HIGH (ref 0–37)
Albumin: 3.9 g/dL (ref 3.5–5.2)
Alkaline Phosphatase: 158 U/L — ABNORMAL HIGH (ref 39–117)
Bilirubin, Direct: 0.3 mg/dL (ref 0.0–0.3)
Total Bilirubin: 1 mg/dL (ref 0.2–1.2)
Total Protein: 7.1 g/dL (ref 6.0–8.3)

## 2023-05-10 LAB — C-REACTIVE PROTEIN: CRP: 15.4 mg/dL (ref 0.5–20.0)

## 2023-05-10 LAB — SEDIMENTATION RATE: Sed Rate: 41 mm/h — ABNORMAL HIGH (ref 0–30)

## 2023-05-11 ENCOUNTER — Telehealth: Payer: Self-pay | Admitting: *Deleted

## 2023-05-11 ENCOUNTER — Encounter: Payer: Self-pay | Admitting: Internal Medicine

## 2023-05-11 LAB — TSH: TSH: 3.88 u[IU]/mL (ref 0.35–5.50)

## 2023-05-11 NOTE — Telephone Encounter (Signed)
 Copied from CRM [id number]. Topic: Clinical - Lab/Test Results >> May 11, 2023 10:11 AM Arley Phenix D wrote: Reason for CRM: Patients husband is calling regarding the status of the urine and blood test the patient took yesterday. Husband stated that the results are not showing on MyChart and would like to speak with the nurse.

## 2023-05-12 ENCOUNTER — Encounter: Payer: Self-pay | Admitting: Internal Medicine

## 2023-05-12 ENCOUNTER — Emergency Department (HOSPITAL_COMMUNITY)

## 2023-05-12 ENCOUNTER — Encounter (HOSPITAL_COMMUNITY): Payer: Self-pay

## 2023-05-12 ENCOUNTER — Emergency Department (HOSPITAL_COMMUNITY)
Admission: EM | Admit: 2023-05-12 | Discharge: 2023-05-12 | Disposition: A | Discharge status: Home / Self Care | Attending: Emergency Medicine | Admitting: Emergency Medicine

## 2023-05-12 ENCOUNTER — Other Ambulatory Visit: Payer: Self-pay

## 2023-05-12 DIAGNOSIS — Z79899 Other long term (current) drug therapy: Secondary | ICD-10-CM | POA: Diagnosis not present

## 2023-05-12 DIAGNOSIS — R109 Unspecified abdominal pain: Secondary | ICD-10-CM | POA: Diagnosis present

## 2023-05-12 DIAGNOSIS — K8020 Calculus of gallbladder without cholecystitis without obstruction: Secondary | ICD-10-CM | POA: Diagnosis not present

## 2023-05-12 HISTORY — DX: Type 2 diabetes mellitus without complications: E11.9

## 2023-05-12 LAB — COMPREHENSIVE METABOLIC PANEL WITH GFR
ALT: 127 U/L — ABNORMAL HIGH (ref 0–44)
AST: 35 U/L (ref 15–41)
Albumin: 3.2 g/dL — ABNORMAL LOW (ref 3.5–5.0)
Alkaline Phosphatase: 98 U/L (ref 38–126)
Anion gap: 10 (ref 5–15)
BUN: 13 mg/dL (ref 8–23)
CO2: 24 mmol/L (ref 22–32)
Calcium: 9.2 mg/dL (ref 8.9–10.3)
Chloride: 104 mmol/L (ref 98–111)
Creatinine, Ser: 1.11 mg/dL — ABNORMAL HIGH (ref 0.44–1.00)
GFR, Estimated: 54 mL/min — ABNORMAL LOW (ref >60)
Glucose, Bld: 128 mg/dL — ABNORMAL HIGH (ref 70–99)
Potassium: 4 mmol/L (ref 3.5–5.1)
Sodium: 138 mmol/L (ref 135–145)
Total Bilirubin: 0.9 mg/dL (ref 0.0–1.2)
Total Protein: 7.4 g/dL (ref 6.5–8.1)

## 2023-05-12 LAB — CBC
HCT: 37.6 % (ref 36.0–46.0)
Hemoglobin: 12 g/dL (ref 12.0–15.0)
MCH: 27.3 pg (ref 26.0–34.0)
MCHC: 31.9 g/dL (ref 30.0–36.0)
MCV: 85.6 fL (ref 80.0–100.0)
Platelets: 239 10*3/uL (ref 150–400)
RBC: 4.39 MIL/uL (ref 3.87–5.11)
RDW: 15.2 % (ref 11.5–15.5)
WBC: 12.1 10*3/uL — ABNORMAL HIGH (ref 4.0–10.5)
nRBC: 0 % (ref 0.0–0.2)

## 2023-05-12 LAB — URINALYSIS, ROUTINE W REFLEX MICROSCOPIC
Bilirubin Urine: NEGATIVE
Glucose, UA: NEGATIVE mg/dL
Hgb urine dipstick: NEGATIVE
Ketones, ur: NEGATIVE mg/dL
Nitrite: NEGATIVE
Protein, ur: NEGATIVE mg/dL
Specific Gravity, Urine: 1.015 (ref 1.005–1.030)
pH: 6 (ref 5.0–8.0)

## 2023-05-12 LAB — LIPASE, BLOOD: Lipase: 33 U/L (ref 11–51)

## 2023-05-12 LAB — HEPATITIS PANEL, ACUTE
HCV Ab: NONREACTIVE
Hep A IgM: NONREACTIVE
Hep B C IgM: NONREACTIVE
Hepatitis B Surface Ag: NONREACTIVE

## 2023-05-12 LAB — ACETAMINOPHEN LEVEL: Acetaminophen (Tylenol), Serum: <10 ug/mL — ABNORMAL LOW (ref 10–30)

## 2023-05-12 MED ORDER — ONDANSETRON HCL 4 MG PO TABS: 4.0000 mg | ORAL_TABLET | Freq: Three times a day (TID) | ORAL | 0 refills | Status: DC | PRN

## 2023-05-12 MED ORDER — HYOSCYAMINE SULFATE 0.125 MG SL SUBL: 0.1250 mg | SUBLINGUAL_TABLET | SUBLINGUAL | 0 refills | Status: DC | PRN

## 2023-05-12 NOTE — Discharge Instructions (Signed)
Contact a health care provider if: You think you have had a gallbladder attack. You have been diagnosed with silent gallstones and you develop indigestion or pain in your abdomen. You have pain from a gallbladder attack that lasts for more than 2 hours. You begin to have attacks more often. You have nausea. You have dark pee or light-colored poop. Get help right away if: You have pain in your abdomen that lasts for more than 5 hours or is getting worse. You have a fever or chills. You have vomiting that does not go away. You develop jaundice.

## 2023-05-12 NOTE — ED Provider Notes (Signed)
 [facility] AT [facility] Provider Note   CSN: [id number] Arrival date & time: 05/12/23  1136     History  Chief Complaint  Patient presents with   Abdominal Pain    Jackie Lewis is a 68 y.o. female    Abdominal Pain      Home Medications Prior to Admission medications   Medication Sig Start Date End Date Taking? Authorizing Provider  levothyroxine (SYNTHROID) 88 MCG tablet Take 88 mcg by mouth daily. 04/14/23  Yes [provider]  ondansetron (ZOFRAN) 4 MG tablet Take 1 tablet (4 mg total) by mouth every 8 (eight) hours as needed for nausea or vomiting. 05/12/23  Yes Robbie Nangle, PA-C  dicyclomine (BENTYL) 20 MG tablet Take 20 mg by mouth 4 (four) times daily. Patient not taking: Reported on 05/12/2023 05/11/23   [provider]  hyoscyamine (LEVSIN/SL) 0.125 MG SL tablet Place 1 tablet (0.125 mg total) under the tongue every 4 (four) hours as needed for cramping (pain). Up to 1.25 mg daily 05/12/23   Arthor Captain, PA-C      Allergies    Epinephrine    Review of Systems   Review of Systems  Gastrointestinal:  Positive for abdominal pain.    Physical Exam Updated Vital Signs BP (!) 120/44   Pulse 80   Temp 97.8 F (36.6 C) (Oral)   Resp 18   Ht 5' 1.5" (1.562 m)   Wt 96 kg   SpO2 100%   BMI 39.34 kg/m  Physical Exam Vitals and nursing note reviewed.  Constitutional:      General: She is not in acute distress.    Appearance: She is well-developed. She is not diaphoretic.  HENT:     Head: Normocephalic and atraumatic.     Right Ear: External ear normal.     Left Ear: External ear normal.     Nose: Nose normal.     Mouth/Throat:     Mouth: Mucous membranes are moist.  Eyes:     General: No scleral icterus.    Conjunctiva/sclera: Conjunctivae normal.  Cardiovascular:     Rate and Rhythm: Normal rate and regular rhythm.     Heart sounds: Normal heart sounds. No murmur heard.    No friction rub. No  gallop.  Pulmonary:     Effort: Pulmonary effort is normal. No respiratory distress.     Breath sounds: Normal breath sounds.  Abdominal:     General: Bowel sounds are normal. There is no distension.     Palpations: Abdomen is soft. There is no mass.     Tenderness: There is abdominal tenderness in the epigastric area. There is no guarding.  Musculoskeletal:     Cervical back: Normal range of motion.  Skin:    General: Skin is warm and dry.  Neurological:     Mental Status: She is alert and oriented to person, place, and time.  Psychiatric:        Behavior: Behavior normal.     ED Results / Procedures / Treatments   Labs (all labs ordered are listed, but only abnormal results are displayed) Labs Reviewed  COMPREHENSIVE METABOLIC PANEL WITH GFR - Abnormal; Notable for the following components:      Result Value   Glucose, Bld 128 (*)    Creatinine, Ser 1.11 (*)    Albumin 3.2 (*)    ALT 127 (*)    GFR, Estimated 54 (*)    All other components within normal  limits  CBC - Abnormal; Notable for the following components:   WBC 12.1 (*)    All other components within normal limits  URINALYSIS, ROUTINE W REFLEX MICROSCOPIC - Abnormal; Notable for the following components:   Leukocytes,Ua TRACE (*)    Bacteria, UA RARE (*)    All other components within normal limits  ACETAMINOPHEN LEVEL - Abnormal; Notable for the following components:   Acetaminophen (Tylenol), Serum <10 (*)    All other components within normal limits  LIPASE, BLOOD  HEPATITIS PANEL, ACUTE    EKG None  Radiology US ABDOMEN LIMITED RUQ (LIVER/GB) Result Date: 05/12/2023 CLINICAL DATA:  212411 Elevated liver enzymes 212411. EXAM: ULTRASOUND ABDOMEN LIMITED RIGHT UPPER QUADRANT COMPARISON:  None Available. FINDINGS: Gallbladder: Physiologically distended. No abnormal wall thickening or pericholecystic free fluid. Several dependent gallstones noted with largest measuring up to 1.7 cm. The technologist noted  negative sonographic Murphy's sign. Common bile duct: Diameter: Up to 5.8 mm.  No intrahepatic bile duct dilation. Liver: There is poor sound beam penetration to the deep / posterior aspects of the liver as a result of increased hepatic echogenicity which reduces the sensitivity of ultrasound for the detection of focal masses. That being said, no focal mass is identified. Portal vein is patent on color Doppler imaging with normal direction of blood flow towards the liver. Other: None. IMPRESSION: *Cholelithiasis without acute cholecystitis. *Increased hepatic echogenicity, a nonspecific finding that is most commonly seen on the basis of steatosis in the absence of known liver disease. Electronically Signed   By: Jules Schick M.D.   On: 05/12/2023 14:15    Procedures Procedures    Medications Ordered in ED Medications - No data to display  ED Course/ Medical Decision Making/ A&P Clinical Course as of 05/12/23 1506  Fri May 12, 2023  1416 ALT(!): 127 [AH]    Clinical Course User Index [AH] Arthor Captain, PA-C                                 Medical Decision Making Amount and/or Complexity of Data Reviewed Labs: ordered. Decision-making details documented in ED Course. Radiology: ordered.  Risk Prescription drug management.   This patient presents to the ED with chief complaint(s) of epigastric abdominal pain, elevated liver enzymes with pertinent past medical history of H. pylori which further complicates the presenting complaint. The complaint involves an extensive differential diagnosis and treatment options and also carries with it a high risk of complications and morbidity.    Differential diagnosis of epigastric pain includes: Functional or nonulcer dyspepsia PUD, GERD, Gastritis, (NSAIDs, alcohol, stress, H. pylori, pernicious anemia), pancreatitis or pancreatic cancer, overeating indigestion (high-fat foods, coffee), drugs (aspirin, antibiotics (eg, macrolides, metronidazole),  corticosteroids, digoxin, narcotics, theophylline), gastroparesis, lactose intolerance, malabsorption gastric cancer, parasitic infection, (Giardia, Strongyloides, Ascaris) cholelithiasis, choledocholithiasis, or cholangitis, ACS, pericarditis, pneumonia, abdominal hernia, pregnancy, intestinal ischemia, esophageal rupture, gastric volvulus, hepatitis.     The initial plan is to order RUQ [place], Hepatitis panel and tylenol level in additions to standard abdominal labs  Additional Tests and treatment considered: Patient is low risk /for ACS therefore do not feel that ACS workup is indicated.  Additional history obtained: Additional history obtained from  husband Records reviewed  primary care labs  Reassessment and review (also see workup area): Lab Tests: I Ordered, and personally interpreted labs.  The pertinent results include:    Labs reviewed, patient's hepatitis panel is negative.  Negative  Tylenol level.  CMP shows only an ALT of 127. Below are previous outpatient labs from hepatic function panel taken 2 days ago.  These are obviously improving significantly.  Alkaline Phosphatase 158 High      70 72  AST 92 High      17 18  ALT 277 High          Imaging Studies: I ordered and independently visualized and interpreted the following imaging Ultrasound right upper quadrant   which showed multiple large gallstones without evidence of increased size of common bile duct or acute cholecystitis The interpretation of the imaging was limited to assessing for emergent pathology, for which purpose it was ordered.  Consultations Obtained: I personally called [place] surgery office to establish referral and to get the patient the closest follow-up appointment  Medicines ordered and prescription drug management: I considered this additional medications: Levsin and Zofran to go home with     Social Determinants of Health: Has established primary care transportation and health  insurance   Complexity of problems addressed: Patient's presentation is most consistent with  acute complicated illness/injury requiring diagnostic workup During patient's assessment  Disposition: After consideration of the diagnostic results and the patient's response to treatment,  I feel that the patent would benefit from discharge strict return precautions, close outpatient follow-up with [facility] surgery, dietary modifications. .          Final Clinical Impression(s) / ED Diagnoses Final diagnoses:  Calculus of gallbladder without cholecystitis without obstruction    Rx / DC Orders ED Discharge Orders          Ordered    ondansetron (ZOFRAN) 4 MG tablet  Every 8 hours PRN        05/12/23 1439    hyoscyamine (LEVSIN/SL) 0.125 MG SL tablet  Every 4 hours PRN,   Status:  Discontinued        05/12/23 1439    hyoscyamine (LEVSIN/SL) 0.125 MG SL tablet  Every 4 hours PRN        05/12/23 1439              Arthor Captain, PA-C 05/12/23 1506    Elayne Snare K, DO 05/12/23 1617

## 2023-05-12 NOTE — ED Triage Notes (Signed)
 Pt states she has had intermittent abdominal pain for past few months. Pt states that she went to PCP and her liver enzymes were elevated and was sent to ED for eval. Pt c/o abdominal pain and fatigue, denies  nausea, vomiting, diarrhea or constipation.

## 2023-05-13 LAB — H. PYLORI ANTIGEN, STOOL: H pylori Ag, Stl: NEGATIVE

## 2023-05-15 ENCOUNTER — Encounter: Payer: Self-pay | Admitting: Internal Medicine

## 2023-05-15 NOTE — Telephone Encounter (Signed)
 Most  likely from  gall bladder attack  .and should get better  with time and resolution of  issue   But we can follow up

## 2023-05-15 NOTE — Progress Notes (Signed)
 Stool H pylori negative  I think the gallstones are causing  the pains you are having   Agree with surgical referral consult as per the ED provider( I was out of town the last 3 days )

## 2023-05-15 NOTE — Progress Notes (Signed)
 I see that  you had to go to ed and the ultrasound showed  gall stones .  The ct scan didn't show these  stones and that happens sometimes.    I agree that the pain you have had acts like gall bladder pain except  location  of pain in abdomen .  The liver abnormality can happen with an attack.  I agree with the surgery consult  asap.  that the ED  advised . Let us know if we need to help in any way ( I was out of town  the last 3 days )

## 2023-05-16 NOTE — Telephone Encounter (Signed)
 Spoke to pt earlier. Made a virtual appt with provider for tomorrow at 9:15am. Please see lab result note.

## 2023-05-16 NOTE — Progress Notes (Unsigned)
 Virtual Visit via Video Note  I connected with Jackie Lewis on 05/17/23 at  9:15 AM EDT by a video enabled telemedicine application and verified that I am speaking with the correct person using two identifiers. Location patient: home Location provider:work office Persons participating in the virtual visit: patient, provider   Patient aware  of the limitations of evaluation and management by telemedicine and  availability of in person appointments. and agreed to proceed.   HPI: Jackie Lewis presents for video visit with spouse as fu of  ed visits x 3 for abd pain  and   [place] noted gallstones without cholecysitis . Wbc was elevated and had transaminitis . Since then  on liquid diet to avoid attacks and fu labs improving .  Has appt next week with Surgery office .   Here to discuss  fu    ROS: See pertinent positives and negatives per HPI.  Past Medical History:  Diagnosis Date   Abnormal thyroid blood test    Allergic rhinitis    Allergy    Anxiety    Carpal tunnel    Bil   Diabetes mellitus without complication (HCC)    GERD (gastroesophageal reflux disease)    Headache(784.0)    Hearing loss    Bil/ worse on left side.   Recurrent UTI    Scleritis    hx   Thyroid disease     Past Surgical History:  Procedure Laterality Date   ESOPHAGOGASTRODUODENOSCOPY  3/09   EXCISION RADIAL HEAD  2015   right elbow   INNER EAR SURGERY     hole in right ear/ surg left ear 2-3 times   INNER EAR SURGERY     UPPER GASTROINTESTINAL ENDOSCOPY      Family History  Problem Relation Age of Onset   Stroke Father    Parkinsonism Father    Uterine cancer Sister 51   Heart disease Sister    Colon cancer Maternal Aunt    Breast cancer Paternal Aunt    Lung cancer Paternal Uncle        heavy smoker   Hemolytic uremic syndrome Daughter        atypical  under rx    Breast cancer Daughter 39       CHEK2+   Other Daughter        CHEK2+   Other Son        traumatic brain injury 2011    Breast cancer Cousin        paternal   Prostate cancer Cousin 40       maternal first cousin (also her husband), CHEK2+   Thyroid disease Other    Hypertension Other    Hyperlipidemia Other     Social History   Tobacco Use   Smoking status: Never   Smokeless tobacco: Never  Vaping Use   Vaping status: Never Used  Substance Use Topics   Alcohol use: Never   Drug use: Never      Current Outpatient Medications:    Cholecalciferol (VITAMIN D3) 1000 units CAPS, Take 1 capsule by mouth daily., Disp: , Rfl:    fluticasone (FLONASE) 50 MCG/ACT nasal spray, SPRAY 2 SPRAYS INTO EACH NOSTRIL EVERY DAY (Patient taking differently: as needed.), Disp: 48 mL, Rfl: 3   ipratropium (ATROVENT) 0.03 % nasal spray, Place 2 sprays into both nostrils every 12 (twelve) hours., Disp: 30 mL, Rfl: 12   levothyroxine (SYNTHROID) 88 MCG tablet, TAKE 1 TABLET BY MOUTH DAILY BEFORE BREAKFAST., Disp:  90 tablet, Rfl: 3   levothyroxine (SYNTHROID) 88 MCG tablet, Take 88 mcg by mouth daily., Disp: , Rfl:    loratadine (CLARITIN) 10 MG tablet, Take 10 mg by mouth daily as needed for allergies., Disp: , Rfl:    triamcinolone (KENALOG) 0.025 % cream, APPLY TO AFFECTED AREA TWICE A DAY AS DIRECTED, Disp: 80 g, Rfl: 1   albuterol (VENTOLIN HFA) 108 (90 Base) MCG/ACT inhaler, Inhale 1-2 puffs into the lungs every 6 (six) hours as needed for wheezing or shortness of breath. (Patient not taking: Reported on 05/17/2023), Disp: 18 g, Rfl: 1   benzonatate (TESSALON) 100 MG capsule, Take 1 capsule (100 mg total) by mouth 3 (three) times daily as needed. (Patient not taking: Reported on 03/16/2023), Disp: 30 capsule, Rfl: 0   calcium carbonate (OS-CAL) 1250 (500 Ca) MG chewable tablet, Chew 2 tablets by mouth daily. (Patient not taking: Reported on 03/16/2023), Disp: , Rfl:    dicyclomine (BENTYL) 20 MG tablet, Take 20 mg by mouth 4 (four) times daily. (Patient not taking: Reported on 05/12/2023), Disp: , Rfl:    hyoscyamine  (LEVSIN/SL) 0.125 MG SL tablet, Place 1 tablet (0.125 mg total) under the tongue every 4 (four) hours as needed for cramping (pain). Up to 1.25 mg daily (Patient not taking: Reported on 05/17/2023), Disp: 20 tablet, Rfl: 0   ibuprofen (ADVIL,MOTRIN) 400 MG tablet, Take by mouth. USING PRN FOR KNEE PAIN (Patient not taking: Reported on 05/17/2023), Disp: , Rfl:    omeprazole (PRILOSEC) 40 MG capsule, TAKE 1 CAPSULE (40 MG TOTAL) BY MOUTH IN THE MORNING (Patient not taking: Reported on 05/17/2023), Disp: 90 capsule, Rfl: 1   ondansetron (ZOFRAN) 4 MG tablet, Take 1 tablet (4 mg total) by mouth every 8 (eight) hours as needed for nausea or vomiting. (Patient not taking: Reported on 05/17/2023), Disp: 10 tablet, Rfl: 0   SODIUM FLUORIDE 5000 SENSITIVE 1.1-5 % PSTE, Take by mouth as directed. (Patient not taking: Reported on 05/17/2023), Disp: , Rfl:   Current Facility-Administered Medications:    0.9 %  sodium chloride infusion, 500 mL, Intravenous, Continuous, Pyrtle, Carie Caddy, MD  EXAM: BP Readings from Last 3 Encounters:  05/12/23 (!) 120/44  03/16/23 130/80  02/27/23 126/78    VITALS per patient if applicable:  GENERAL: alert, oriented, appears well and in no acute distress non icteric   HEENT: atraumatic, conjunttiva clear, no obvious abnormalities on inspection of external nose and ears  NECK: normal movements of the head and neck  LUNGS: on inspection no signs of respiratory distress, breathing rate appears normal, no obvious gross SOB, gasping or wheezing  CV: no obvious cyanosis  MS: moves all visible extremities without noticeable abnormality  PSYCH/NEURO: pleasant and cooperative, no obvious depression or anxiety, speech and thought processing grossly intact Lab Results  Component Value Date   WBC 12.1 (H) 05/12/2023   HGB 12.0 05/12/2023   HCT 37.6 05/12/2023   PLT 239 05/12/2023   GLUCOSE 128 (H) 05/12/2023   CHOL 185 03/16/2023   TRIG 112.0 03/16/2023   HDL 62.30 03/16/2023    LDLDIRECT 113.9 06/22/2010   LDLCALC 101 (H) 03/16/2023   ALT 127 (H) 05/12/2023   AST 35 05/12/2023   NA 138 05/12/2023   K 4.0 05/12/2023   CL 104 05/12/2023   CREATININE 1.11 (H) 05/12/2023   BUN 13 05/12/2023   CO2 24 05/12/2023   TSH 3.88 05/10/2023   HGBA1C 5.9 03/16/2023   MICROALBUR 1.0 03/16/2023   Record review  ASSESSMENT AND PLAN:  Discussed the following assessment and plan:    ICD-10-CM   1. Intermittent generalized abdominal pain  R10.84 Hepatic Function Panel    Basic metabolic panel with GFR    CBC with Differential/Platelet    2. Gall stones  K80.20 Hepatic Function Panel    Basic metabolic panel with GFR    CBC with Differential/Platelet    3. Transaminitis  R74.01 Hepatic Function Panel    Basic metabolic panel with GFR    CBC with Differential/Platelet     Continue light eating low fat  for now  Agree that all sx and lab  c/w gall bladder  attacks except description of location  in abdomen( peri and below umbilicus)    disc  ct scan  can miss and not  a  complete fo of gall stones.  They agree .   Plan fu lab next week  perhaps before surgery appt   to ensure  going in right direction.  Counseled.   Expectant management and discussion of plan and treatment with opportunity to ask questions and all were answered. The patient agreed with the plan and demonstrated an understanding of the instructions.   Advised to call back or seek an in-person evaluation if worsening  or having  further concerns  in interim. No follow-ups on file.    Berniece Andreas, MD

## 2023-05-17 ENCOUNTER — Telehealth (INDEPENDENT_AMBULATORY_CARE_PROVIDER_SITE_OTHER): Admitting: Internal Medicine

## 2023-05-17 ENCOUNTER — Encounter: Payer: Self-pay | Admitting: Internal Medicine

## 2023-05-17 DIAGNOSIS — R1084 Generalized abdominal pain: Secondary | ICD-10-CM

## 2023-05-17 DIAGNOSIS — K8020 Calculus of gallbladder without cholecystitis without obstruction: Secondary | ICD-10-CM

## 2023-05-17 DIAGNOSIS — R7401 Elevation of levels of liver transaminase levels: Secondary | ICD-10-CM | POA: Diagnosis not present

## 2023-05-22 ENCOUNTER — Other Ambulatory Visit (INDEPENDENT_AMBULATORY_CARE_PROVIDER_SITE_OTHER)

## 2023-05-22 DIAGNOSIS — R1084 Generalized abdominal pain: Secondary | ICD-10-CM | POA: Diagnosis not present

## 2023-05-22 DIAGNOSIS — R7401 Elevation of levels of liver transaminase levels: Secondary | ICD-10-CM

## 2023-05-22 DIAGNOSIS — E039 Hypothyroidism, unspecified: Secondary | ICD-10-CM | POA: Diagnosis not present

## 2023-05-22 DIAGNOSIS — K8020 Calculus of gallbladder without cholecystitis without obstruction: Secondary | ICD-10-CM

## 2023-05-22 LAB — CBC WITH DIFFERENTIAL/PLATELET
Basophils Absolute: 0 K/uL (ref 0.0–0.1)
Basophils Relative: 0.7 % (ref 0.0–3.0)
Eosinophils Absolute: 0.3 K/uL (ref 0.0–0.7)
Eosinophils Relative: 4.6 % (ref 0.0–5.0)
HCT: 36.8 % (ref 36.0–46.0)
Hemoglobin: 12.3 g/dL (ref 12.0–15.0)
Lymphocytes Relative: 30.4 % (ref 12.0–46.0)
Lymphs Abs: 2.1 K/uL (ref 0.7–4.0)
MCHC: 33.4 g/dL (ref 30.0–36.0)
MCV: 83.9 fl (ref 78.0–100.0)
Monocytes Absolute: 0.3 K/uL (ref 0.1–1.0)
Monocytes Relative: 5 % (ref 3.0–12.0)
Neutro Abs: 4.1 K/uL (ref 1.4–7.7)
Neutrophils Relative %: 59.3 % (ref 43.0–77.0)
Platelets: 307 K/uL (ref 150.0–400.0)
RBC: 4.38 Mil/uL (ref 3.87–5.11)
RDW: 14.9 % (ref 11.5–15.5)
WBC: 7 K/uL (ref 4.0–10.5)

## 2023-05-22 LAB — T4, FREE: Free T4: 1.09 ng/dL (ref 0.60–1.60)

## 2023-05-22 LAB — HEPATIC FUNCTION PANEL
ALT: 29 U/L (ref 0–35)
AST: 20 U/L (ref 0–37)
Albumin: 4.2 g/dL (ref 3.5–5.2)
Alkaline Phosphatase: 79 U/L (ref 39–117)
Bilirubin, Direct: 0.1 mg/dL (ref 0.0–0.3)
Total Bilirubin: 0.3 mg/dL (ref 0.2–1.2)
Total Protein: 7.9 g/dL (ref 6.0–8.3)

## 2023-05-22 LAB — BASIC METABOLIC PANEL WITH GFR
BUN: 16 mg/dL (ref 6–23)
CO2: 26 meq/L (ref 19–32)
Calcium: 9.5 mg/dL (ref 8.4–10.5)
Chloride: 105 meq/L (ref 96–112)
Creatinine, Ser: 0.7 mg/dL (ref 0.40–1.20)
GFR: 88.45 mL/min (ref >60.00)
Glucose, Bld: 97 mg/dL (ref 70–99)
Potassium: 3.8 meq/L (ref 3.5–5.1)
Sodium: 140 meq/L (ref 135–145)

## 2023-05-22 LAB — TSH: TSH: 2.18 u[IU]/mL (ref 0.35–5.50)

## 2023-05-23 ENCOUNTER — Other Ambulatory Visit: Payer: Self-pay | Admitting: Surgery

## 2023-05-23 ENCOUNTER — Other Ambulatory Visit: Payer: Self-pay | Admitting: Internal Medicine

## 2023-05-23 DIAGNOSIS — Z0000 Encounter for general adult medical examination without abnormal findings: Secondary | ICD-10-CM

## 2023-05-24 ENCOUNTER — Ambulatory Visit
Admission: RE | Admit: 2023-05-24 | Discharge: 2023-05-24 | Disposition: A | Discharge status: Home / Self Care | Source: Ambulatory Visit | Attending: Internal Medicine | Admitting: Internal Medicine

## 2023-05-24 ENCOUNTER — Encounter: Payer: Self-pay | Admitting: Internal Medicine

## 2023-05-24 DIAGNOSIS — Z0000 Encounter for general adult medical examination without abnormal findings: Secondary | ICD-10-CM

## 2023-05-24 NOTE — Progress Notes (Signed)
 Fu labs are now all in normal range  including liver . Reassuring

## 2023-05-29 ENCOUNTER — Other Ambulatory Visit: Payer: Self-pay | Admitting: Internal Medicine

## 2023-05-29 DIAGNOSIS — R928 Other abnormal and inconclusive findings on diagnostic imaging of breast: Secondary | ICD-10-CM

## 2023-05-30 ENCOUNTER — Other Ambulatory Visit (INDEPENDENT_AMBULATORY_CARE_PROVIDER_SITE_OTHER): Payer: BC Managed Care – PPO

## 2023-05-30 DIAGNOSIS — E039 Hypothyroidism, unspecified: Secondary | ICD-10-CM | POA: Diagnosis not present

## 2023-05-30 DIAGNOSIS — R7309 Other abnormal glucose: Secondary | ICD-10-CM | POA: Diagnosis not present

## 2023-05-30 DIAGNOSIS — Z79899 Other long term (current) drug therapy: Secondary | ICD-10-CM

## 2023-05-30 DIAGNOSIS — R030 Elevated blood-pressure reading, without diagnosis of hypertension: Secondary | ICD-10-CM

## 2023-05-30 DIAGNOSIS — H7192 Unspecified cholesteatoma, left ear: Secondary | ICD-10-CM | POA: Diagnosis not present

## 2023-05-30 DIAGNOSIS — E669 Obesity, unspecified: Secondary | ICD-10-CM

## 2023-05-30 LAB — CBC WITH DIFFERENTIAL/PLATELET
Basophils Absolute: 0 10*3/uL (ref 0.0–0.1)
Basophils Relative: 0.8 % (ref 0.0–3.0)
Eosinophils Absolute: 0.3 10*3/uL (ref 0.0–0.7)
Eosinophils Relative: 5.3 % — ABNORMAL HIGH (ref 0.0–5.0)
HCT: 37.8 % (ref 36.0–46.0)
Hemoglobin: 12.6 g/dL (ref 12.0–15.0)
Lymphocytes Relative: 35.7 % (ref 12.0–46.0)
Lymphs Abs: 1.9 10*3/uL (ref 0.7–4.0)
MCHC: 33.3 g/dL (ref 30.0–36.0)
MCV: 83.2 fl (ref 78.0–100.0)
Monocytes Absolute: 0.3 10*3/uL (ref 0.1–1.0)
Monocytes Relative: 6.4 % (ref 3.0–12.0)
Neutro Abs: 2.7 10*3/uL (ref 1.4–7.7)
Neutrophils Relative %: 51.8 % (ref 43.0–77.0)
Platelets: 229 10*3/uL (ref 150.0–400.0)
RBC: 4.55 Mil/uL (ref 3.87–5.11)
RDW: 14.8 % (ref 11.5–15.5)
WBC: 5.2 10*3/uL (ref 4.0–10.5)

## 2023-05-30 LAB — HEMOGLOBIN A1C: Hgb A1c MFr Bld: 5.8 % (ref 4.6–6.5)

## 2023-05-30 LAB — BASIC METABOLIC PANEL WITH GFR
BUN: 17 mg/dL (ref 6–23)
CO2: 27 meq/L (ref 19–32)
Calcium: 9.6 mg/dL (ref 8.4–10.5)
Chloride: 103 meq/L (ref 96–112)
Creatinine, Ser: 0.65 mg/dL (ref 0.40–1.20)
GFR: 90.03 mL/min (ref >60.00)
Glucose, Bld: 105 mg/dL — ABNORMAL HIGH (ref 70–99)
Potassium: 3.9 meq/L (ref 3.5–5.1)
Sodium: 138 meq/L (ref 135–145)

## 2023-05-30 LAB — LIPID PANEL
Cholesterol: 187 mg/dL (ref 0–200)
HDL: 63.3 mg/dL (ref >39.00)
LDL Cholesterol: 100 mg/dL — ABNORMAL HIGH (ref 0–99)
NonHDL: 124.02
Total CHOL/HDL Ratio: 3
Triglycerides: 118 mg/dL (ref 0.0–149.0)
VLDL: 23.6 mg/dL (ref 0.0–40.0)

## 2023-05-30 LAB — HEPATIC FUNCTION PANEL
ALT: 23 U/L (ref 0–35)
AST: 21 U/L (ref 0–37)
Albumin: 4.2 g/dL (ref 3.5–5.2)
Alkaline Phosphatase: 77 U/L (ref 39–117)
Bilirubin, Direct: 0.1 mg/dL (ref 0.0–0.3)
Total Bilirubin: 0.4 mg/dL (ref 0.2–1.2)
Total Protein: 7.5 g/dL (ref 6.0–8.3)

## 2023-05-30 LAB — TSH: TSH: 3.71 u[IU]/mL (ref 0.35–5.50)

## 2023-05-30 NOTE — Progress Notes (Signed)
 Blood work stable   borderline blood sugar but no diabetes   and liver  tests continued normal   thyroid in range

## 2023-06-05 NOTE — Progress Notes (Signed)
 Chief Complaint  Patient presents with   Annual Exam    Pt reports her GI is retire and would like a new referral.     HPI: Patient  Jerrye 4  68 y.o. comes in today for yearly visit   ENT  followed after surgery .  To have cholecystectomy next week and no recent attacks  but has changed diet for now.  Needs reerral to Gi here for colon is overdue   on q 5 year   w polyps   last 2017? Dr Bridgett Camps There was some  changes when asked for  gi referral to address abd painb now decided from gallstones and not another issue.   Health Maintenance  Topic Date Due   COVID-19 Vaccine (4 - 2024-25 season) 06/22/2023 (Originally 10/16/2022)   Zoster Vaccines- Shingrix (2 of 2) 06/26/2023 (Originally 04/21/2021)   Colonoscopy  08/06/2023 (Originally 02/01/2021)   INFLUENZA VACCINE  09/15/2023   Medicare Annual Wellness (AWV)  12/27/2023   MAMMOGRAM  05/23/2025   DTaP/Tdap/Td (3 - Td or Tdap) 06/14/2025   Pneumonia Vaccine 48+ Years old  Completed   DEXA SCAN  Completed   Hepatitis C Screening  Completed   HPV VACCINES  Aged Out   Meningococcal B Vaccine  Aged Out   Health Maintenance Review LIFESTYLE:  Exercise:  outside    yard work  Tobacco/ETS:n Alcohol: n Sugar beverages:   no Sleep:  average 5+ hours  Drug use: no     ROS:  GEN/ HEENT: No fever, significant weight changes sweats headaches vision problems hearing changes, CV/ PULM; No chest pain shortness of breath cough, syncope,edema  change in exercise tolerance. GI /GU: No  vomiting, change in bowel habits. No blood in the stool. No significant GU symptoms. SKIN/HEME: ,no acute skin rashes suspicious lesions or bleeding. No lymphadenopathy, nodules, masses.  NEURO/ PSYCH:  No neurologic signs such as weakness numbness. No depression anxiety. IMM/ Allergy: No unusual infections.  Allergy .   REST of 12 system review negative except as per HPI   Past Medical History:  Diagnosis Date   Abnormal thyroid  blood test     Allergic rhinitis    Allergy    Anxiety    Carpal tunnel    Bil   Diabetes mellitus without complication (HCC)    GERD (gastroesophageal reflux disease)    Headache(784.0)    Hearing loss    Bil/ worse on left side.   Recurrent UTI    Scleritis    hx   Thyroid  disease     Past Surgical History:  Procedure Laterality Date   ESOPHAGOGASTRODUODENOSCOPY  3/09   EXCISION RADIAL HEAD  2015   right elbow   INNER EAR SURGERY     hole in right ear/ surg left ear 2-3 times   INNER EAR SURGERY     UPPER GASTROINTESTINAL ENDOSCOPY      Family History  Problem Relation Age of Onset   Stroke Father    Parkinsonism Father    Uterine cancer Sister 95   Heart disease Sister    Colon cancer Maternal Aunt    Breast cancer Paternal Aunt    Lung cancer Paternal Uncle        heavy smoker   Hemolytic uremic syndrome Daughter        atypical  under rx    Breast cancer Daughter 52       CHEK2+   Other Daughter        CHEK2+  Other Son        traumatic brain injury 2011   Breast cancer Cousin        paternal   Prostate cancer Cousin 91       maternal first cousin (also her husband), CHEK2+   Thyroid  disease Other    Hypertension Other    Hyperlipidemia Other     Social History   Socioeconomic History   Marital status: Married    Spouse name: Not on file   Number of children: Not on file   Years of education: Not on file   Highest education level: Not on file  Occupational History   Not on file  Tobacco Use   Smoking status: Never   Smokeless tobacco: Never  Vaping Use   Vaping status: Never Used  Substance and Sexual Activity   Alcohol use: Never   Drug use: Never   Sexual activity: Not Currently  Other Topics Concern   Not on file  Social History Narrative   ** Merged History Encounter **       Married hh of 3- Regular exercise- no 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 Daughter graduated from [employer]  grad school in [place] [employer]   Got hus living in [place] Daughter   In [place]   Social Drivers of Health   Financial Resource Strain: Low Risk  (06/01/2022)   Overall Financial Resource Strain (CARDIA)    Difficulty of Paying Living Expenses: Not very hard  Food Insecurity: No Food Insecurity (06/01/2022)   Hunger Vital Sign    Worried About Running Out of Food in the Last Year: Never true    Ran Out of Food in the Last Year: Never true  Transportation Needs: No Transportation Needs (06/01/2022)   PRAPARE - Administrator, Civil Service (Medical): No    Lack of Transportation (Non-Medical): No  Physical Activity: Inactive (12/26/2022)   Exercise Vital Sign    Days of Exercise per Week: 0 days    Minutes of Exercise per Session: 0 min  Stress: No Stress Concern Present (12/26/2022)   [employer] of Occupational Health - Occupational Stress Questionnaire    Feeling of Stress : Only a little  Social Connections: Moderately Isolated (12/26/2022)   Social Connection and Isolation Panel [NHANES]    Frequency of Communication with Friends and Family: More than three times a week    Frequency of Social Gatherings with Friends and Family: Twice a week    Attends Religious Services: Never    Database administrator or Organizations: No    Attends Banker Meetings: Never    Marital Status: Married    Outpatient Medications Prior to Visit  Medication Sig Dispense Refill   Cholecalciferol (VITAMIN D3) 1000 units CAPS Take 1,000 Units by mouth daily.     triamcinolone  (KENALOG ) 0.025 % cream APPLY TO AFFECTED AREA TWICE A DAY AS DIRECTED 80 g 1   levothyroxine  (SYNTHROID ) 88 MCG tablet TAKE 1 TABLET BY MOUTH DAILY BEFORE BREAKFAST. 90 tablet 3   albuterol  (VENTOLIN   HFA) 108 (90 Base) MCG/ACT inhaler Inhale 1-2 puffs into the  lungs every 6 (six) hours as needed for wheezing or shortness of breath. (Patient not taking: Reported on 05/09/2023) 18 g 1   fluticasone  (FLONASE ) 50 MCG/ACT nasal spray SPRAY 2 SPRAYS INTO EACH NOSTRIL EVERY DAY (Patient not taking: No sig reported) 48 mL 3   hyoscyamine  (LEVSIN/SL) 0.125 MG SL tablet Place 1 tablet (0.125 mg total) under the tongue every 4 (four) hours as needed for cramping (pain). Up to 1.25 mg daily (Patient not taking: Reported on 05/17/2023) 20 tablet 0   ipratropium (ATROVENT ) 0.03 % nasal spray Place 2 sprays into both nostrils every 12 (twelve) hours. (Patient not taking: Reported on 06/05/2023) 30 mL 12   omeprazole  (PRILOSEC) 40 MG capsule TAKE 1 CAPSULE (40 MG TOTAL) BY MOUTH IN THE MORNING (Patient not taking: Reported on 05/17/2023) 90 capsule 1   ondansetron  (ZOFRAN ) 4 MG tablet Take 1 tablet (4 mg total) by mouth every 8 (eight) hours as needed for nausea or vomiting. (Patient not taking: Reported on 05/17/2023) 10 tablet 0   benzonatate  (TESSALON ) 100 MG capsule Take 1 capsule (100 mg total) by mouth 3 (three) times daily as needed. (Patient not taking: Reported on 03/16/2023) 30 capsule 0   Facility-Administered Medications Prior to Visit  Medication Dose Route Frequency Provider Last Rate Last Admin   0.9 %  sodium chloride  infusion  500 mL Intravenous Continuous Pyrtle, Amber Bail, MD         EXAM:  BP 110/70 (BP Location: Left Arm, Patient Position: Sitting, Cuff Size: Large)   Pulse 77   Temp 98 F (36.7 C) (Oral)   Ht 5' 0.9" (1.547 m)   Wt 205 lb 6.4 oz (93.2 kg)   SpO2 97%   BMI 38.94 kg/m   Body mass index is 38.94 kg/m. Wt Readings from Last 3 Encounters:  06/06/23 205 lb 6.4 oz (93.2 kg)  05/12/23 211 lb 10.3 oz (96 kg)  03/16/23 209 lb 3.2 oz (94.9 kg)    Physical Exam: Vital signs reviewed UJW:JXBJ is a well-developed well-nourished alert cooperative    who appearsr stated age in no  acute distress.  HEENT: normocephalic atraumatic , Eyes: PERRL EOM's full, conjunctiva clear, Nares: paten,t no deformity discharge or tenderness., Ears: no deformity EAC's clearleft eac with surgical changes   . Mouth: clear OP, no lesions, edema.  Moist mucous membranes. Dentition in adequate repair. NECK: supple without masses, thyromegaly or bruits. CHEST/PULM:  Clear to auscultation and percussion breath sounds equal no wheeze , rales or rhonchi. No chest wall deformities or tenderness. Breast: normal by inspection . No dimpling, discharge, masses, tenderness or discharge . CV: PMI is nondisplaced, S1 S2 no gallops, murmurs, rubs. Peripheral pulses are full without delay.No JVD .  ABDOMEN: Bowel sounds normal nontender  No guard or rebound, no hepato splenomegal no CVA tenderness.  Extremtities:  No clubbing cyanosis or edema, no acute joint swelling or redness no focal atrophy NEURO:  Oriented x3, cranial nerves 3-12 appear to be intact, no obvious focal weakness,gait within normal limits no abnormal reflexes or asymmetrical SKIN: No acute rashes normal turgor, color, no bruising or petechiae. PSYCH: Oriented, good eye contact, no obvious depression anxiety, cognition and judgment appear normal. LN: no cervical axillary adenopathy  Lab Results  Component Value Date   WBC 5.2 05/30/2023   HGB 12.6 05/30/2023   HCT 37.8 05/30/2023   PLT 229.0 05/30/2023   GLUCOSE 105 (H) 05/30/2023   CHOL 187 05/30/2023   TRIG 118.0 05/30/2023   HDL  63.30 05/30/2023   LDLDIRECT 113.9 06/22/2010   LDLCALC 100 (H) 05/30/2023   ALT 23 05/30/2023   AST 21 05/30/2023   NA 138 05/30/2023   K 3.9 05/30/2023   CL 103 05/30/2023   CREATININE 0.65 05/30/2023   BUN 17 05/30/2023   CO2 27 05/30/2023   TSH 3.71 05/30/2023   HGBA1C 5.8 05/30/2023   MICROALBUR 1.0 03/16/2023    BP Readings from Last 3 Encounters:  06/06/23 110/70  05/12/23 (!) 120/44  03/16/23 130/80    Lab results reviewed with  patient   ASSESSMENT AND PLAN:  Discussed the following assessment and plan:    ICD-10-CM   1. Hypothyroidism, unspecified type  E03.9     2. Gall stones  K80.20     3. Medication management  Z79.899     4. History of Helicobacter pylori infection  Z86.19     5. 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 History of colon polyps  Z86.0100 Ambulatory referral to Gastroenterology    Minimal fasting hyperglycemia but A1c in acceptable range   Disc with patient  lsi  Follow  every 6-12 months as indicated  Glad  she is doing better and  gall stone issues should explain all of her recent abd gi difficulties    Expectant management.  Return in about 1 year (around 06/05/2024).  Patient Care Team: Milany Geck K, MD as PCP - General (Internal Medicine) ent ortho  Shaolin Armas K, MD Patient Instructions  Good to see you today .  Refilled thyroid  medication today  same dosage.   When possible work on healthy weight control even 5 # can help prevent diabetes or  other  metabolic problems  Will do a new referral to Gi about updating colonoscopy.   Otherwise yearly check  .  Tavaria Mackins K. Nevin Grizzle M.D.

## 2023-06-06 ENCOUNTER — Ambulatory Visit (INDEPENDENT_AMBULATORY_CARE_PROVIDER_SITE_OTHER): Payer: BC Managed Care – PPO | Admitting: Internal Medicine

## 2023-06-06 ENCOUNTER — Encounter: Payer: Self-pay | Admitting: Internal Medicine

## 2023-06-06 VITALS — BP 110/70 | HR 77 | Temp 98.0°F | Ht 60.9 in | Wt 205.4 lb

## 2023-06-06 DIAGNOSIS — K8020 Calculus of gallbladder without cholecystitis without obstruction: Secondary | ICD-10-CM | POA: Diagnosis not present

## 2023-06-06 DIAGNOSIS — Z860100 Personal history of colon polyps, unspecified: Secondary | ICD-10-CM

## 2023-06-06 DIAGNOSIS — Z79899 Other long term (current) drug therapy: Secondary | ICD-10-CM | POA: Diagnosis not present

## 2023-06-06 DIAGNOSIS — H7192 Unspecified cholesteatoma, left ear: Secondary | ICD-10-CM

## 2023-06-06 DIAGNOSIS — Z8619 Personal history of other infectious and parasitic diseases: Secondary | ICD-10-CM

## 2023-06-06 DIAGNOSIS — E039 Hypothyroidism, unspecified: Secondary | ICD-10-CM | POA: Diagnosis not present

## 2023-06-06 MED ORDER — LEVOTHYROXINE SODIUM 88 MCG PO TABS: 88.0000 ug | ORAL_TABLET | Freq: Every day | ORAL | 3 refills | Status: AC

## 2023-06-06 NOTE — Patient Instructions (Addendum)
 Good to see you today .  Refilled thyroid  medication today  same dosage.   When possible work on healthy weight control even 5 # can help prevent diabetes or  other  metabolic problems  Will do a new referral to Gi about updating colonoscopy.   Otherwise yearly check  .

## 2023-06-08 NOTE — Patient Instructions (Signed)
 SURGICAL WAITING ROOM VISITATION Patients having surgery or a procedure may have no more than 2 support people in the waiting area - these visitors may rotate in the visitor waiting room.   If the patient needs to stay at the hospital during part of their recovery, the visitor guidelines for inpatient rooms apply.  PRE-OP VISITATION  Pre-op nurse will coordinate an appropriate time for 1 support person to accompany the patient in pre-op.  This support person may not rotate.  This visitor will be contacted when the time is appropriate for the visitor to come back in the pre-op area.  Please refer to the [facility] website for the visitor guidelines for Inpatients (after your surgery is over and you are in a regular room).  You are not required to quarantine at this time prior to your surgery. However, you must do this: Hand Hygiene often Do NOT share personal items Notify your provider if you are in close contact with someone who has COVID or you develop fever 100.4 or greater, new onset of sneezing, cough, sore throat, shortness of breath or body aches.  If you test positive for Covid or have been in contact with anyone that has tested positive in the last 10 days please notify you surgeon.    Your procedure is scheduled on:   TUESDAY  June 13, 2023   Report to [facility] Main Entrance: [address] entrance where the [employer] is available.   Report to admitting at:  05:45   AM  Call this number if you have any questions or problems the morning of surgery [phone]  FOLLOW ANY ADDITIONAL PRE OP INSTRUCTIONS YOU RECEIVED FROM YOUR SURGEON'S OFFICE!!!  Do not eat food after Midnight the night prior to your surgery/procedure.  After Midnight you may have the following liquids until  05:00 AM DAY OF SURGERY  Clear Liquid Diet Water Black Coffee (sugar ok, NO MILK/CREAM OR CREAMERS)  Tea (sugar ok, NO MILK/CREAM OR CREAMERS) regular and decaf                              Plain Jell-O  with no fruit (NO RED)                                           Fruit ices (not with fruit pulp, NO RED)                                     Popsicles (NO RED)                                                                  Juice: NO CITRUS JUICES: only apple, WHITE grape, WHITE cranberry Sports drinks like Gatorade or Powerade (NO RED)                 Oral Hygiene is also important to reduce your risk of infection.        Remember - BRUSH YOUR TEETH THE MORNING OF SURGERY WITH YOUR REGULAR TOOTHPASTE  Do NOT smoke after Midnight the night before surgery.  STOP TAKING all Vitamins, Herbs and supplements 1 week before your surgery.   Take ONLY these medicines the morning of surgery with A SIP OF WATER: levothyroxine                    You may not have any metal on your body including hair pins, jewelry, and body piercing  Do not wear make-up, lotions, powders, perfumes or deodorant  Do not wear nail polish including gel and S&S, artificial / acrylic nails, or any other type of covering on natural nails including finger and toenails. If you have artificial nails, gel coating, etc., that needs to be removed by a nail salon, Please have this removed prior to surgery. Not doing so may mean that your surgery could be cancelled or delayed if the Surgeon or anesthesia staff feels like they are unable to monitor you safely.   Do not shave 48 hours prior to surgery to avoid nicks in your skin which may contribute to postoperative infections.   Contacts, Hearing Aids, dentures or bridgework may not be worn into surgery. DENTURES WILL BE REMOVED PRIOR TO SURGERY PLEASE DO NOT APPLY "Poly grip" OR ADHESIVES!!!  Patients discharged on the day of surgery will not be allowed to drive home.  Someone NEEDS to stay with you for the first 24 hours after anesthesia.  Do not bring your home medications to the hospital. The Pharmacy will dispense medications listed on your medication list to  you during your admission in the Hospital.  Please read over the following fact sheets you were given: IF YOU HAVE QUESTIONS ABOUT YOUR PRE-OP INSTRUCTIONS, PLEASE CALL [phone]   [facility] - Preparing for Surgery Before surgery, you can play an important role.  Because skin is not sterile, your skin needs to be as free of germs as possible.  You can reduce the number of germs on your skin by washing with CHG (chlorahexidine gluconate) soap before surgery.  CHG is an antiseptic cleaner which kills germs and bonds with the skin to continue killing germs even after washing. Please DO NOT use if you have an allergy to CHG or antibacterial soaps.  If your skin becomes reddened/irritated stop using the CHG and inform your nurse when you arrive at Short Stay. Do not shave (including legs and underarms) for at least 48 hours prior to the first CHG shower.  You may shave your face/neck.  Please follow these instructions carefully:  1.  Shower with CHG Soap the night before surgery and the  morning of surgery.  2.  If you choose to wash your hair, wash your hair first as usual with your normal  shampoo.  3.  After you shampoo, rinse your hair and body thoroughly to remove the shampoo.                             4.  Use CHG as you would any other liquid soap.  You can apply chg directly to the skin and wash.  Gently with a scrungie or clean washcloth.  5.  Apply the CHG Soap to your body ONLY FROM THE NECK DOWN.   Do not use on face/ open                           Wound or open sores. Avoid contact with eyes, ears  mouth and genitals (private parts).                       Wash face,  Genitals (private parts) with your normal soap.             6.  Wash thoroughly, paying special attention to the area where your  surgery  will be performed.  7.  Thoroughly rinse your body with warm water from the neck down.  8.  DO NOT shower/wash with your normal soap after using and rinsing off the CHG Soap.             9.  Pat yourself dry with a clean towel.            10.  Wear clean pajamas.            11.  Place clean sheets on your bed the night of your first shower and do not  sleep with pets.  ON THE DAY OF SURGERY : Do not apply any lotions/deodorants the morning of surgery.  Please wear clean clothes to the hospital/surgery center.     FAILURE TO FOLLOW THESE INSTRUCTIONS MAY RESULT IN THE CANCELLATION OF YOUR SURGERY  PATIENT SIGNATURE_________________________________  NURSE SIGNATURE__________________________________  ________________________________________________________________________

## 2023-06-08 NOTE — Progress Notes (Signed)
 The patient was identified using 2 approved identifiers. All issues noted in this document were discussed and addressed, Jackie Lewis           voiced understanding and agreement with all preoperative instructions. The patient was emailed the surgery instructions per  her request.       [name]  The patient was instructed to call our Pharmacy [phone]) and the Admitting Office [phone] or [phone]) to complete their Pre-surgical Interview.   COVID Vaccine received:  []  No [x]  Yes Date of any COVID positive Test in last 90 days:  PCP - Daphine Eagle, MD  Cardiologist -   Chest x-ray -  EKG -  will do DOS if needed.  Stress Test -  ECHO -  Cardiac Cath -   Bowel Prep - [x]  No  []   Yes ______  Pacemaker / ICD device [x]  No []  Yes   Spinal Cord Stimulator:[x]  No []  Yes       History of Sleep Apnea? [x]  No []  Yes   CPAP used?- [x]  No []  Yes    Does the patient monitor blood sugar?   [x]  N/A   []  No []  Yes  Patient has: [x]  NO Hx DM   []  Pre-DM   []  DM1  []   DM2 Last A1c was: 5.8  on   05-30-2023    Blood Thinner / Instructions:  none Aspirin Instructions:  none  ERAS Protocol Ordered: []  No  [x]  Yes PRE-SURGERY [x]  ENSURE  []  G2   Patient is to be NPO after: 0500  Dental hx: []  Dentures:  []  N/A      []  Bridge or Partial:                   []  Loose or Damaged teeth:   Comments: patient had all blood work done at her PCP office on 05-30-23.   Activity level: Patient is able / unable to climb a flight of stairs without difficulty; []  No CP  []  No SOB, but would have ___   Patient can / can not perform ADLs without assistance.   Anesthesia review: GERD, HOH-  anxiety  Patient denies shortness of breath, fever, cough and chest pain at PAT appointment.  Patient verbalized understanding and agreement to the Pre-Surgical Instructions that were given to them at this PAT appointment. Patient was also educated of the need to review these PAT instructions again  prior to his/her surgery.I reviewed the appropriate phone numbers to call if they have any and questions or concerns.

## 2023-06-09 ENCOUNTER — Other Ambulatory Visit: Payer: Self-pay

## 2023-06-09 ENCOUNTER — Encounter (HOSPITAL_COMMUNITY): Payer: Self-pay

## 2023-06-09 ENCOUNTER — Encounter (HOSPITAL_COMMUNITY)
Admission: RE | Admit: 2023-06-09 | Discharge: 2023-06-09 | Disposition: A | Discharge status: Home / Self Care | Source: Ambulatory Visit | Attending: Surgery | Admitting: Surgery

## 2023-06-09 VITALS — BP 152/81 | HR 78 | Temp 98.4°F | Resp 20 | Ht 61.0 in | Wt 200.0 lb

## 2023-06-09 DIAGNOSIS — I10 Essential (primary) hypertension: Secondary | ICD-10-CM | POA: Insufficient documentation

## 2023-06-09 DIAGNOSIS — Z01810 Encounter for preprocedural cardiovascular examination: Secondary | ICD-10-CM | POA: Insufficient documentation

## 2023-06-09 DIAGNOSIS — Z01818 Encounter for other preprocedural examination: Secondary | ICD-10-CM

## 2023-06-09 HISTORY — DX: Other complications of anesthesia, initial encounter: T88.59XA

## 2023-06-09 HISTORY — DX: Essential (primary) hypertension: I10

## 2023-06-09 HISTORY — DX: Unspecified osteoarthritis, unspecified site: M19.90

## 2023-06-09 HISTORY — DX: Hypothyroidism, unspecified: E03.9

## 2023-06-12 NOTE — Anesthesia Preprocedure Evaluation (Signed)
 Anesthesia Evaluation  Patient identified by MRN, date of birth, ID band Patient awake    Reviewed: Allergy & Precautions, NPO status , Patient's Chart, lab work & pertinent test results  Airway Mallampati: II  TM Distance: >3 FB Neck ROM: Full    Dental no notable dental hx.    Pulmonary neg pulmonary ROS   Pulmonary exam normal        Cardiovascular hypertension,  Rhythm:Regular Rate:Normal     Neuro/Psych  Headaches  Anxiety        GI/Hepatic Neg liver ROS,GERD  Medicated,,  Endo/Other  diabetesHypothyroidism    Renal/GU   negative genitourinary   Musculoskeletal  (+) Arthritis , Osteoarthritis,    Abdominal Normal abdominal exam  (+)   Peds  Hematology Lab Results      Component                Value               Date                      WBC                      5.2                 05/30/2023                HGB                      12.6                05/30/2023                HCT                      37.8                05/30/2023                MCV                      83.2                05/30/2023                PLT                      229.0               05/30/2023              Anesthesia Other Findings   Reproductive/Obstetrics                             Anesthesia Physical Anesthesia Plan  ASA: 2  Anesthesia Plan: General   Post-op Pain Management: Celebrex PO (pre-op)* and Ofirmev  IV (intra-op)*   Induction: Intravenous  PONV Risk Score and Plan: 3 and Ondansetron , Dexamethasone, Treatment may vary due to age or medical condition and Midazolam  Airway Management Planned: Mask and Oral ETT  Additional Equipment: None  Intra-op Plan:   Post-operative Plan: Extubation in OR  Informed Consent: I have reviewed the patients History and Physical, chart, labs and discussed the procedure including the risks, benefits and alternatives for the proposed anesthesia with the  patient or authorized representative who has  indicated his/her understanding and acceptance.     Dental advisory given  Plan Discussed with: CRNA  Anesthesia Plan Comments:        Anesthesia Quick Evaluation

## 2023-06-12 NOTE — H&P (Signed)
 REFERRING PHYSICIAN: Ebony Goldstein* PROVIDER: Debi Fall, MD MRN: [id number] DOB: May 29, 1955  Subjective   Chief Complaint: New Consultation (Gallbladder)  History of Present Illness: Jackie Lewis is a 68 y.o. female who is seen as an office consultation for evaluation of New Consultation (Gallbladder)  This is a pleasant 68 year old female who is referred here for symptomatic gallstones. Over the last 3 months she has been having intermittent epigastric and right upper quadrant abdominal pain with bloating and nausea after fatty meals. The pain can last for many hours at times. She denies jaundice. Bowel movements have been normal. Most recently she presented to the emergency department. She had an unremarkable CT of her abdomen and pelvis. Her ultrasound showed gallstones with the largest measuring 1.7 cm. The bile duct was 5.8 m. At that time her AST, ALT, and alkaline phosphatase were elevated as well as her white blood count. These have all since returned to normal. Currently today she is pain-free. She has no cardiopulmonary issues.  Review of Systems: A complete review of systems was obtained from the patient. I have reviewed this information and discussed as appropriate with the patient. See HPI as well for other ROS.  ROS   Medical History: Past Medical History:  Diagnosis Date  Thyroid  disease   Patient Active Problem List  Diagnosis  Disorder of middle ear and mastoid  Trigger thumb of left hand  Closed displaced fracture of head of radius  Chronic pain of right knee  Right anterior knee pain  Tear of articular cartilage of right knee as current injury  Locking of right knee  Sensorineural hearing loss (SNHL) of right ear with restricted hearing of left ear  Chronic eczematous otitis externa of right ear  Cerumen impaction  Bilateral impacted cerumen  Rhinitis, chronic  Mixed conductive and sensorineural hearing loss of left ear with restricted  hearing of right ear  Cholesteatoma, left   Past Surgical History:  Procedure Laterality Date  TYMPANOPLASTY W/MASTOIDECTOMY W/OSSICULAR CHAIN RECONSTRUCTION Left 12/05/2022  Procedure: Revision Tympanoplasty with canal wall down mastoidectomy and ossicular chain; Surgeon: Kaylie, David Marcus, MD; Location: [facility] OR; Service: Otolaryngology Head and Neck; Laterality: Left;  GRAFT EAR CARTILAGE TO EAR AUTOGENOUS Left 12/05/2022  Procedure: GRAFT; EAR CARTILAGE, AUTOGENOUS, TO EAR (INCLUDES OBTAININGGRAFT); Surgeon: Kaylie, David Marcus, MD; Location: [facility] OR; Service: Otolaryngology Head and Neck; Laterality: Left;  MICROSURGERY Left 12/05/2022  Procedure: MICROSURGICAL TECHNIQUES, REQUIRING USE OF OPERATING MICROSCOPE (LIST IN ADDITION TO PRIMARY PROCEDURE); Surgeon: Kaylie, David Marcus, MD; Location: [facility] OR; Service: Otolaryngology Head and Neck; Laterality: Left;  RECONSTRUCTION EXTERNAL AUDITORY CANAL Left 12/05/2022  Procedure: RECONSTRUCTION OF EXTERNAL AUDITORY CANAL (MEATOPLASTY) (EG, FOR STENOSIS DUE TO INJURY, INFECTION) (SEPARATE PROCEDURE); Surgeon: Kaylie, David Marcus, MD; Location: [facility] OR; Service: Otolaryngology Head and Neck; Laterality: Left;  EXAMINATION UNDER ANESTHESIA EAR/NOSE/THROAT  LAPAROSCOPIC TUBAL LIGATION  REPAIR TYMPANIC MEMBRANE Bilateral    Allergies  Allergen Reactions  Epinephrine Unknown  REACTION: dental injection got tachycardia  Nitrofurantoin Monohyd/M-Cryst Rash   Current Outpatient Medications on File Prior to Visit  Medication Sig Dispense Refill  calcium carbonate 500 mg calcium (1,250 mg) chewable tablet Take by mouth  cholecalciferol (VITAMIN D3) 1,000 unit capsule Take by mouth.  Compound Medication Ciprofloxacin  55mg  Clotrimazole 36mg  Boric Acid 16mg  Micronized Dexamethasone 3mg  3 puffs to affected ear twice daily 6 each 2  dicyclomine (BENTYL) 20 mg tablet Take 1 tablet (20 mg total) by mouth every 6 (six) hours As  needed for  abdominal pain 120 tablet 11  ibuprofen  (ADVIL ,MOTRIN ) 400 MG tablet Take 400 mg by mouth continuously as needed for Pain  levothyroxine  (SYNTHROID , LEVOTHROID) 88 MCG tablet Take 1 tablet (88 mcg total) by mouth once daily Take on an empty stomach with a glass of water at least 30-60 minutes before breakfast.  loratadine (CLARITIN REDITABS) 10 mg dissolvable tablet Take 10 mg by mouth once daily  omeprazole  (PRILOSEC) 40 MG DR capsule Take 40 mg by mouth every morning  triamcinolone  0.025 % cream  amoxicillin  (AMOXIL ) 500 MG capsule TAKE 4 CAPSULES BY MOUTH 1 HOUR PRIOR TO DENTAL APPOINTMENT (Patient not taking: Reported on 05/02/2023) 12  amoxicillin  (AMOXIL ) 500 MG tablet (Patient not taking: Reported on 05/23/2023) 0  ciprofloxacin -dexamethasone (CIPRODEX) otic suspension Insert 3-4 drops into ear canal while lying down, affected ear up. Stay in that position for 5 minutes. (Patient not taking: Reported on 05/23/2023) 7.5 mL 6   No current facility-administered medications on file prior to visit.   Family History  Problem Relation Age of Onset  Hearing loss Mother  Cancer Sister  Heart disease Sister  Allergies Son  Asthma Son  Neurological disorder Son  Thyroid  disease Neg Hx    Social History   Tobacco Use  Smoking Status Never  Smokeless Tobacco Never    Social History   Socioeconomic History  Marital status: Married  Tobacco Use  Smoking status: Never  Smokeless tobacco: Never  Vaping Use  Vaping status: Never Used  Substance and Sexual Activity  Alcohol use: No  Alcohol/week: 0.0 standard drinks of alcohol  Drug use: No   Social Drivers of Corporate investment banker Strain: Low Risk (06/01/2022)  Received from [facility]  Overall Financial Resource Strain (CARDIA)  Difficulty of Paying Living Expenses: Not very hard  Food Insecurity: No Food Insecurity (06/01/2022)  Received from [facility]  Hunger Vital Sign  Worried About Running Out of Food in  the Last Year: Never true  Ran Out of Food in the Last Year: Never true  Transportation Needs: No Transportation Needs (06/01/2022)  Received from [facility] - Transportation  Lack of Transportation (Medical): No  Lack of Transportation (Non-Medical): No  Physical Activity: Inactive (12/26/2022)  Received from [facility]  Exercise Vital Sign  Days of Exercise per Week: 0 days  Minutes of Exercise per Session: 0 min  Stress: No Stress Concern Present (12/26/2022)  Received from [facility] - Occupational Stress Questionnaire  Feeling of Stress : Only a little  Social Connections: Moderately Isolated (12/26/2022)  Received from [facility]  Social Connection and Isolation Panel [NHANES]  Frequency of Communication with Friends and Family: More than three times a week  Frequency of Social Gatherings with Friends and Family: Twice a week  Attends Religious Services: Never  Database administrator or Organizations: No  Attends Engineer, structural: Never  Marital Status: Married   Objective:   Vitals:   BP: (!) 152/96  Pulse: 86  Temp: 36.7 C (98 F)  SpO2: 96%  Weight: 94.4 kg (208 lb 3.2 oz)  Height: 156.2 cm (5' 1.5")  PainSc: 0-No pain   Body mass index is 38.7 kg/m.  Physical Exam   She appears well on exam  Her abdomen is soft and obese. There is no tenderness in the right upper quadrant. There are no palpable masses. There is no hepatomegaly.  Labs, Imaging and Diagnostic Testing: I have reviewed her notes  in the electronic medical records as well as her ultrasound and CT scan  Assessment and Plan:   Diagnoses and all orders for this visit:  Symptomatic cholelithiasis   I gave the patient and her husband literature regarding gallstones and we discussed removal of the gallbladder. Given her symptoms, a laparoscopic cholecystectomy is recommended. We discussed potential complications without surgery.  She is very interested in having her gallbladder removed given the symptoms she has been having. I explained the surgeon procedure in detail. We discussed the risks which includes but is not limited to bleeding, infection, injury to surrounding structures, the need to convert to an open procedure, bile duct injury, bile leak, cardiopulmonary issues with anesthesia, postoperative recovery, DVT, etc. She understands and wished to proceed with surgery which will be scheduled

## 2023-06-13 ENCOUNTER — Other Ambulatory Visit: Payer: Self-pay

## 2023-06-13 ENCOUNTER — Encounter (HOSPITAL_COMMUNITY)
Admission: RE | Disposition: A | Discharge status: Home / Self Care | Payer: Self-pay | Source: Home / Self Care | Attending: Surgery

## 2023-06-13 ENCOUNTER — Encounter (HOSPITAL_COMMUNITY): Payer: Self-pay | Admitting: Surgery

## 2023-06-13 ENCOUNTER — Ambulatory Visit (HOSPITAL_BASED_OUTPATIENT_CLINIC_OR_DEPARTMENT_OTHER): Payer: Self-pay | Admitting: Anesthesiology

## 2023-06-13 ENCOUNTER — Ambulatory Visit (HOSPITAL_COMMUNITY)
Admission: RE | Admit: 2023-06-13 | Discharge: 2023-06-13 | Disposition: A | Discharge status: Home / Self Care | Attending: Surgery | Admitting: Surgery

## 2023-06-13 ENCOUNTER — Ambulatory Visit (HOSPITAL_COMMUNITY): Payer: Self-pay | Admitting: Anesthesiology

## 2023-06-13 DIAGNOSIS — K219 Gastro-esophageal reflux disease without esophagitis: Secondary | ICD-10-CM | POA: Diagnosis not present

## 2023-06-13 DIAGNOSIS — I10 Essential (primary) hypertension: Secondary | ICD-10-CM | POA: Diagnosis not present

## 2023-06-13 DIAGNOSIS — K8010 Calculus of gallbladder with chronic cholecystitis without obstruction: Secondary | ICD-10-CM

## 2023-06-13 DIAGNOSIS — E119 Type 2 diabetes mellitus without complications: Secondary | ICD-10-CM | POA: Diagnosis not present

## 2023-06-13 DIAGNOSIS — K8020 Calculus of gallbladder without cholecystitis without obstruction: Secondary | ICD-10-CM | POA: Diagnosis present

## 2023-06-13 HISTORY — PX: CHOLECYSTECTOMY: SHX55

## 2023-06-13 LAB — GLUCOSE, CAPILLARY: Glucose-Capillary: 138 mg/dL — ABNORMAL HIGH (ref 70–99)

## 2023-06-13 SURGERY — LAPAROSCOPIC CHOLECYSTECTOMY
Anesthesia: General

## 2023-06-13 MED ORDER — ROCURONIUM BROMIDE 100 MG/10ML IV SOLN
INTRAVENOUS | Status: DC | PRN
  Administered 2023-06-13: 50 mg via INTRAVENOUS

## 2023-06-13 MED ORDER — PROPOFOL 500 MG/50ML IV EMUL
INTRAVENOUS | Status: AC
  Filled 2023-06-13: qty 50

## 2023-06-13 MED ORDER — DEXAMETHASONE SODIUM PHOSPHATE 10 MG/ML IJ SOLN
INTRAMUSCULAR | Status: AC
  Filled 2023-06-13: qty 1

## 2023-06-13 MED ORDER — FENTANYL CITRATE PF 50 MCG/ML IJ SOSY
25.0000 ug | PREFILLED_SYRINGE | INTRAMUSCULAR | Status: DC | PRN
  Administered 2023-06-13: 50 ug via INTRAVENOUS

## 2023-06-13 MED ORDER — ONDANSETRON HCL 4 MG/2ML IJ SOLN
INTRAMUSCULAR | Status: AC
  Filled 2023-06-13: qty 2

## 2023-06-13 MED ORDER — BUPIVACAINE HCL (PF) 0.5 % IJ SOLN
INTRAMUSCULAR | Status: DC | PRN
  Administered 2023-06-13: 20 mL

## 2023-06-13 MED ORDER — ACETAMINOPHEN 500 MG PO TABS
1000.0000 mg | ORAL_TABLET | ORAL | Status: DC
  Filled 2023-06-13: qty 2

## 2023-06-13 MED ORDER — ROCURONIUM BROMIDE 10 MG/ML (PF) SYRINGE
PREFILLED_SYRINGE | INTRAVENOUS | Status: AC
  Filled 2023-06-13: qty 10

## 2023-06-13 MED ORDER — CEFAZOLIN SODIUM-DEXTROSE 2-4 GM/100ML-% IV SOLN
2.0000 g | INTRAVENOUS | Status: AC
  Administered 2023-06-13: 2 g via INTRAVENOUS
  Filled 2023-06-13: qty 100

## 2023-06-13 MED ORDER — TRAMADOL HCL 50 MG PO TABS: 50.0000 mg | ORAL_TABLET | Freq: Four times a day (QID) | ORAL | 0 refills | Status: AC | PRN

## 2023-06-13 MED ORDER — ACETAMINOPHEN 10 MG/ML IV SOLN: 1000.0000 mg | Freq: Once | INTRAVENOUS | Status: DC | PRN

## 2023-06-13 MED ORDER — ENSURE PRE-SURGERY PO LIQD
296.0000 mL | Freq: Once | ORAL | Status: DC
Start: 2023-06-14 — End: 2023-06-13
  Filled 2023-06-13: qty 296

## 2023-06-13 MED ORDER — ORAL CARE MOUTH RINSE: 15.0000 mL | Freq: Once | OROMUCOSAL | Status: AC

## 2023-06-13 MED ORDER — MIDAZOLAM HCL 2 MG/2ML IJ SOLN
INTRAMUSCULAR | Status: AC
Start: 2023-06-13 — End: ?
  Filled 2023-06-13: qty 2

## 2023-06-13 MED ORDER — LIDOCAINE HCL (CARDIAC) PF 100 MG/5ML IV SOSY
PREFILLED_SYRINGE | INTRAVENOUS | Status: DC | PRN
  Administered 2023-06-13: 100 mg via INTRAVENOUS

## 2023-06-13 MED ORDER — CELECOXIB 200 MG PO CAPS
200.0000 mg | ORAL_CAPSULE | Freq: Once | ORAL | Status: AC
  Administered 2023-06-13: 200 mg via ORAL
  Filled 2023-06-13: qty 1

## 2023-06-13 MED ORDER — PROPOFOL 10 MG/ML IV BOLUS
INTRAVENOUS | Status: DC | PRN
  Administered 2023-06-13: 200 mg via INTRAVENOUS

## 2023-06-13 MED ORDER — CHLORHEXIDINE GLUCONATE CLOTH 2 % EX PADS: 6.0000 | MEDICATED_PAD | Freq: Once | CUTANEOUS | Status: DC

## 2023-06-13 MED ORDER — DEXAMETHASONE SODIUM PHOSPHATE 10 MG/ML IJ SOLN
INTRAMUSCULAR | Status: DC | PRN
Start: 2023-06-13 — End: 2023-06-13
  Administered 2023-06-13: 4 mg via INTRAVENOUS

## 2023-06-13 MED ORDER — LIDOCAINE HCL (PF) 2 % IJ SOLN
INTRAMUSCULAR | Status: AC
  Filled 2023-06-13: qty 5

## 2023-06-13 MED ORDER — ACETAMINOPHEN 500 MG PO TABS: 1000.0000 mg | ORAL_TABLET | Freq: Once | ORAL | Status: DC

## 2023-06-13 MED ORDER — SUGAMMADEX SODIUM 200 MG/2ML IV SOLN
INTRAVENOUS | Status: DC | PRN
  Administered 2023-06-13: 200 mg via INTRAVENOUS

## 2023-06-13 MED ORDER — BUPIVACAINE HCL (PF) 0.5 % IJ SOLN
INTRAMUSCULAR | Status: AC
  Filled 2023-06-13: qty 30

## 2023-06-13 MED ORDER — FENTANYL CITRATE PF 50 MCG/ML IJ SOSY
PREFILLED_SYRINGE | INTRAMUSCULAR | Status: DC
Start: 2023-06-13 — End: 2023-06-13
  Filled 2023-06-13: qty 1

## 2023-06-13 MED ORDER — LACTATED RINGERS IV SOLN: INTRAVENOUS | Status: DC

## 2023-06-13 MED ORDER — ONDANSETRON HCL 4 MG/2ML IJ SOLN
INTRAMUSCULAR | Status: DC | PRN
  Administered 2023-06-13: 4 mg via INTRAVENOUS

## 2023-06-13 MED ORDER — CHLORHEXIDINE GLUCONATE 0.12 % MT SOLN
15.0000 mL | Freq: Once | OROMUCOSAL | Status: AC
  Administered 2023-06-13: 15 mL via OROMUCOSAL

## 2023-06-13 MED ORDER — FENTANYL CITRATE (PF) 100 MCG/2ML IJ SOLN
INTRAMUSCULAR | Status: AC
  Filled 2023-06-13: qty 2

## 2023-06-13 MED ORDER — 0.9 % SODIUM CHLORIDE (POUR BTL) OPTIME
TOPICAL | Status: DC | PRN
  Administered 2023-06-13: 1000 mL

## 2023-06-13 SURGICAL SUPPLY — 28 items
BAG COUNTER SPONGE SURGICOUNT (BAG) IMPLANT
CABLE HIGH FREQUENCY MONO STRZ (ELECTRODE) ×1 IMPLANT
CHLORAPREP W/TINT 26 (MISCELLANEOUS) ×1 IMPLANT
CLIP APPLIE 5 13 M/L LIGAMAX5 (MISCELLANEOUS) ×1 IMPLANT
COVER MAYO STAND XLG (MISCELLANEOUS) IMPLANT
COVER SURGICAL LIGHT HANDLE (MISCELLANEOUS) ×1 IMPLANT
DERMABOND ADVANCED .7 DNX12 (GAUZE/BANDAGES/DRESSINGS) ×1 IMPLANT
DRAPE C-ARM 42X120 X-RAY (DRAPES) IMPLANT
ELECT REM PT RETURN 15FT ADLT (MISCELLANEOUS) ×1 IMPLANT
GLOVE BIO SURGEON STRL SZ7.5 (GLOVE) ×1 IMPLANT
GOWN STRL REUS W/ TWL XL LVL3 (GOWN DISPOSABLE) ×1 IMPLANT
HEMOSTAT SURGICEL 4X8 (HEMOSTASIS) IMPLANT
IRRIGATION SUCT STRKRFLW 2 WTP (MISCELLANEOUS) ×1 IMPLANT
KIT BASIN OR (CUSTOM PROCEDURE TRAY) ×1 IMPLANT
KIT TURNOVER KIT A (KITS) IMPLANT
PENCIL SMOKE EVACUATOR (MISCELLANEOUS) IMPLANT
SCISSORS LAP 5X35 DISP (ENDOMECHANICALS) ×1 IMPLANT
SET CHOLANGIOGRAPH MIX (MISCELLANEOUS) IMPLANT
SET TUBE SMOKE EVAC HIGH FLOW (TUBING) ×1 IMPLANT
SLEEVE Z-THREAD 5X100MM (TROCAR) ×2 IMPLANT
SPIKE FLUID TRANSFER (MISCELLANEOUS) ×1 IMPLANT
SUT MNCRL AB 4-0 PS2 18 (SUTURE) ×1 IMPLANT
SUT VICRYL 0 UR6 27IN ABS (SUTURE) ×1 IMPLANT
SYSTEM BAG RETRIEVAL 10MM (BASKET) ×1 IMPLANT
TOWEL OR 17X26 10 PK STRL BLUE (TOWEL DISPOSABLE) ×1 IMPLANT
TRAY LAPAROSCOPIC (CUSTOM PROCEDURE TRAY) ×1 IMPLANT
TROCAR BALLN 12MMX100 BLUNT (TROCAR) ×1 IMPLANT
TROCAR Z-THREAD OPTICAL 5X100M (TROCAR) ×1 IMPLANT

## 2023-06-13 NOTE — Interval H&P Note (Signed)
 History and Physical Interval Note:no change in H and P  06/13/2023 6:48 AM  Jackie Lewis  has presented today for surgery, with the diagnosis of SYMPTOMATIC GALLSTONES.  The various methods of treatment have been discussed with the patient and family. After consideration of risks, benefits and other options for treatment, the patient has consented to  Procedure(s) with comments: LAPAROSCOPIC CHOLECYSTECTOMY (N/A) - LAPAROSCOPIC CHOLECYSTECTOMY as a surgical intervention.  The patient's history has been reviewed, patient examined, no change in status, stable for surgery.  I have reviewed the patient's chart and labs.  Questions were answered to the patient's satisfaction.     Oza Blumenthal

## 2023-06-13 NOTE — Discharge Instructions (Signed)
 [facility] ______[facility] LAPAROSCOPIC SURGERY: POST OP INSTRUCTIONS Always review your discharge instruction sheet given to you by the facility where your surgery was performed. IF YOU HAVE DISABILITY OR FAMILY LEAVE FORMS, YOU MUST BRING THEM TO THE OFFICE FOR PROCESSING.   DO NOT GIVE THEM TO YOUR DOCTOR.  A prescription for pain medication may be given to you upon discharge.  Take your pain medication as prescribed, if needed.  If narcotic pain medicine is not needed, then you may take acetaminophen  (Tylenol ) or ibuprofen  (Advil ) as needed. Take your usually prescribed medications unless otherwise directed. If you need a refill on your pain medication, please contact your pharmacy.  They will contact our office to request authorization. Prescriptions will not be filled after 5pm or on week-ends. You should follow a light diet the first few days after arrival home, such as soup and crackers, etc.  Be sure to include lots of fluids daily. Most patients will experience some swelling and bruising in the area of the incisions.  Ice packs will help.  Swelling and bruising can take several days to resolve.  It is common to experience some constipation if taking pain medication after surgery.  Increasing fluid intake and taking a stool softener (such as Colace) will usually help or prevent this problem from occurring.  A mild laxative (Milk of Magnesia or Miralax) should be taken according to package instructions if there are no bowel movements after 48 hours. Unless discharge instructions indicate otherwise, you may remove your bandages 24-48 hours after surgery, and you may shower at that time.  You may have steri-strips (small skin tapes) in place directly over the incision.  These strips should be left on the skin for 7-10 days.  If your surgeon used skin glue on the incision, you may shower in 24 hours.  The glue will flake off over the next 2-3 weeks.  Any sutures or staples will be  removed at the office during your follow-up visit. ACTIVITIES:  You may resume regular (light) daily activities beginning the next day--such as daily self-care, walking, climbing stairs--gradually increasing activities as tolerated.  You may have sexual intercourse when it is comfortable.  Refrain from any heavy lifting or straining until approved by your doctor. You may drive when you are no longer taking prescription pain medication, you can comfortably wear a seatbelt, and you can safely maneuver your car and apply brakes. RETURN TO WORK:  __________________________________________________________ Jackie Lewis should see your doctor in the office for a follow-up appointment approximately 2-3 weeks after your surgery.  Make sure that you call for this appointment within a day or two after you arrive home to insure a convenient appointment time. OTHER INSTRUCTIONS: YOU MAY SHOWER STARTING TOMORROW ICE PACK AND TYLENOL  ALSO FOR PAIN NO LIFTING MORE THAN 15 POUNDS FOR 2 WEEKS __________________________________________________________________________________________________________________________ __________________________________________________________________________________________________________________________ WHEN TO CALL YOUR DOCTOR: Fever over 101.0 Inability to urinate Continued bleeding from incision. Increased pain, redness, or drainage from the incision. Increasing abdominal pain  The clinic staff is available to answer your questions during regular business hours.  Please don't hesitate to call and ask to speak to one of the nurses for clinical concerns.  If you have a medical emergency, go to the nearest emergency room or call 911.  A surgeon from [facility] is always on call at the hospital. [address], [address], [place], [place]  [address] ? [address] [address], [place], [place]   [address] [phone] ? [phone] ? FAX [phone] Web site:  [email or web address]

## 2023-06-13 NOTE — Anesthesia Procedure Notes (Signed)
 Procedure Name: Intubation Date/Time: 06/13/2023 7:56 AM  Performed by: Norvell Beers, CRNAPre-anesthesia Checklist: Patient identified, Emergency Drugs available, Suction available and Patient being monitored Patient Re-evaluated:Patient Re-evaluated prior to induction Oxygen Delivery Method: Circle system utilized Preoxygenation: Pre-oxygenation with 100% oxygen Induction Type: IV induction Ventilation: Mask ventilation without difficulty Laryngoscope Size: Mac and 3 Grade View: Grade I Tube type: Oral Tube size: 7.5 mm Number of attempts: 1 Airway Equipment and Method: Stylet Placement Confirmation: ETT inserted through vocal cords under direct vision, positive ETCO2 and breath sounds checked- equal and bilateral Secured at: 23 cm Tube secured with: Tape Dental Injury: Teeth and Oropharynx as per pre-operative assessment  Comments: Cords clear, atraumatic.

## 2023-06-13 NOTE — Transfer of Care (Signed)
 Immediate Anesthesia Transfer of Care Note  Patient: Jackie Lewis  Procedure(s) Performed: LAPAROSCOPIC CHOLECYSTECTOMY  Patient Location: PACU  Anesthesia Type:General  Level of Consciousness: awake and patient cooperative  Airway & Oxygen Therapy: Patient Spontanous Breathing and Patient connected to face mask  Post-op Assessment: Report given to RN and Post -op Vital signs reviewed and stable  Post vital signs: Reviewed and stable  Last Vitals:  Vitals Value Taken Time  BP 151/75 06/13/23 0839  Temp    Pulse 73 06/13/23 0841  Resp 22 06/13/23 0841  SpO2 100 % 06/13/23 0841  Vitals shown include unfiled device data.  Last Pain:  Vitals:   06/13/23 0659  TempSrc:   PainSc: 0-No pain         Complications: No notable events documented.

## 2023-06-13 NOTE — Op Note (Signed)
 Laparoscopic Cholecystectomy Procedure Note  Indications: This patient presents with symptomatic gallbladder disease and will undergo laparoscopic cholecystectomy.  Pre-operative Diagnosis: symptomatic cholelithiasis  Post-operative Diagnosis: same  Surgeon: Oza Blumenthal   Assistants: none  Anesthesia: General endotracheal anesthesia  ASA Class: 2  Procedure Details  The patient was seen again in the Holding Room. The risks, benefits, complications, treatment options, and expected outcomes were discussed with the patient. The possibilities of reaction to medication, pulmonary aspiration, perforation of viscus, bleeding, recurrent infection, finding a normal gallbladder, the need for additional procedures, failure to diagnose a condition, the possible need to convert to an open procedure, and creating a complication requiring transfusion or operation were discussed with the patient. The likelihood of improving the patient's symptoms with return to their baseline status is good.  The patient and/or family concurred with the proposed plan, giving informed consent. The site of surgery properly noted. The patient was taken to Operating Room, identified as Jackie Lewis and the procedure verified as Laparoscopic Cholecystectomy with Intraoperative Cholangiogram. A Time Out was held and the above information confirmed.  Prior to the induction of general anesthesia, antibiotic prophylaxis was administered. General endotracheal anesthesia was then administered and tolerated well. After the induction, the abdomen was prepped with Chloraprep and draped in sterile fashion. The patient was positioned in the supine position.  Local anesthetic agent was injected into the skin near the umbilicus and an incision made. We dissected down to the abdominal fascia with blunt dissection.  The fascia was incised vertically and we entered the peritoneal cavity bluntly.  A pursestring suture of 0-Vicryl was placed  around the fascial opening.  The Hasson cannula was inserted and secured with the stay suture.  Pneumoperitoneum was then created with CO2 and tolerated well without any adverse changes in the patient's vital signs. A 5-mm port was placed in the subxiphoid position.  Two 5-mm ports were placed in the right upper quadrant. All skin incisions were infiltrated with a local anesthetic agent before making the incision and placing the trocars.   We positioned the patient in reverse Trendelenburg, tilted slightly to the patient's left.  The gallbladder was identified and appeared chronically inflamed.   The fundus grasped and retracted cephalad. Adhesions were lysed bluntly and with the electrocautery taking care not to injure any adjacent organs or viscus. The infundibulum was grasped and retracted laterally, exposing the peritoneum overlying the triangle of Calot. This was then divided and exposed in a blunt fashion. The cystic duct was clearly identified and bluntly dissected circumferentially. A critical view of the cystic duct and cystic artery was obtained.  The cystic duct was then ligated with clips and divided. The cystic artery was, dissected free, ligated with clips and divided as well.   The gallbladder was dissected from the liver bed in retrograde fashion with the electrocautery. The gallbladder was removed and placed in an Endocatch sac. The liver bed was irrigated and inspected. Hemostasis was achieved with the electrocautery. Copious irrigation was utilized and was repeatedly aspirated until clear.  The gallbladder and Endocatch sac were then removed through the umbilical port site.  The pursestring suture was used to close the umbilical fascia.    We again inspected the right upper quadrant for hemostasis.  Pneumoperitoneum was released as we removed the trocars.  4-0 Monocryl was used to close the skin.   Skin glue was then applied. The patient was then extubated and brought to the recovery room in  stable condition. Instrument, sponge, and  needle counts were correct at closure and at the conclusion of the case.   Findings: Chronic cholecystitis with Cholelithiasis  Estimated Blood Loss: Minimal         Drains: none         Specimens: Gallbladder           Complications: None; patient tolerated the procedure well.         Disposition: PACU - hemodynamically stable.         Condition: stable

## 2023-06-13 NOTE — Anesthesia Postprocedure Evaluation (Signed)
 Anesthesia Post Note  Patient: Jackie Lewis  Procedure(s) Performed: LAPAROSCOPIC CHOLECYSTECTOMY     Patient location during evaluation: PACU Anesthesia Type: General Level of consciousness: awake and alert Pain management: pain level controlled Vital Signs Assessment: post-procedure vital signs reviewed and stable Respiratory status: spontaneous breathing, nonlabored ventilation, respiratory function stable and patient connected to nasal cannula oxygen Cardiovascular status: blood pressure returned to baseline and stable Postop Assessment: no apparent nausea or vomiting Anesthetic complications: no   No notable events documented.  Last Vitals:  Vitals:   06/13/23 0955 06/13/23 1000  BP: (!) 151/80 (!) 141/74  Pulse: 66 72  Resp: 14   Temp: 36.4 C   SpO2: 94% 93%    Last Pain:  Vitals:   06/13/23 0955  TempSrc: Oral  PainSc: 4                  Shandale Malak P Geron Mulford

## 2023-06-14 ENCOUNTER — Encounter (HOSPITAL_COMMUNITY): Payer: Self-pay | Admitting: Surgery

## 2023-06-14 LAB — SURGICAL PATHOLOGY

## 2023-06-20 ENCOUNTER — Other Ambulatory Visit: Payer: Self-pay | Admitting: Internal Medicine

## 2023-06-20 ENCOUNTER — Ambulatory Visit
Admission: RE | Admit: 2023-06-20 | Discharge: 2023-06-20 | Disposition: A | Discharge status: Home / Self Care | Source: Ambulatory Visit | Attending: Internal Medicine | Admitting: Internal Medicine

## 2023-06-20 ENCOUNTER — Encounter: Payer: Self-pay | Admitting: Internal Medicine

## 2023-06-20 DIAGNOSIS — N6310 Unspecified lump in the right breast, unspecified quadrant: Secondary | ICD-10-CM

## 2023-06-20 DIAGNOSIS — R928 Other abnormal and inconclusive findings on diagnostic imaging of breast: Secondary | ICD-10-CM

## 2023-06-22 ENCOUNTER — Other Ambulatory Visit

## 2023-06-23 ENCOUNTER — Ambulatory Visit
Admission: RE | Admit: 2023-06-23 | Discharge: 2023-06-23 | Disposition: A | Discharge status: Home / Self Care | Source: Ambulatory Visit | Attending: Internal Medicine | Admitting: Internal Medicine

## 2023-06-23 DIAGNOSIS — N6310 Unspecified lump in the right breast, unspecified quadrant: Secondary | ICD-10-CM

## 2023-06-24 ENCOUNTER — Other Ambulatory Visit: Payer: Self-pay

## 2023-06-24 ENCOUNTER — Emergency Department (HOSPITAL_COMMUNITY)

## 2023-06-24 ENCOUNTER — Inpatient Hospital Stay (HOSPITAL_COMMUNITY)
Admission: EM | Admit: 2023-06-24 | Discharge: 2023-06-26 | DRG: 440 | Disposition: A | Discharge status: Home / Self Care | Attending: Internal Medicine | Admitting: Internal Medicine

## 2023-06-24 ENCOUNTER — Encounter (HOSPITAL_COMMUNITY): Payer: Self-pay | Admitting: *Deleted

## 2023-06-24 DIAGNOSIS — Z9049 Acquired absence of other specified parts of digestive tract: Secondary | ICD-10-CM

## 2023-06-24 DIAGNOSIS — R7989 Other specified abnormal findings of blood chemistry: Secondary | ICD-10-CM

## 2023-06-24 DIAGNOSIS — R17 Unspecified jaundice: Secondary | ICD-10-CM

## 2023-06-24 DIAGNOSIS — E66812 Obesity, class 2: Secondary | ICD-10-CM | POA: Diagnosis present

## 2023-06-24 DIAGNOSIS — E119 Type 2 diabetes mellitus without complications: Secondary | ICD-10-CM | POA: Diagnosis present

## 2023-06-24 DIAGNOSIS — K8689 Other specified diseases of pancreas: Secondary | ICD-10-CM | POA: Diagnosis present

## 2023-06-24 DIAGNOSIS — R748 Abnormal levels of other serum enzymes: Secondary | ICD-10-CM

## 2023-06-24 DIAGNOSIS — K831 Obstruction of bile duct: Secondary | ICD-10-CM | POA: Diagnosis present

## 2023-06-24 DIAGNOSIS — K851 Biliary acute pancreatitis without necrosis or infection: Principal | ICD-10-CM

## 2023-06-24 DIAGNOSIS — Z79890 Hormone replacement therapy (postmenopausal): Secondary | ICD-10-CM

## 2023-06-24 DIAGNOSIS — E039 Hypothyroidism, unspecified: Secondary | ICD-10-CM | POA: Diagnosis present

## 2023-06-24 DIAGNOSIS — K760 Fatty (change of) liver, not elsewhere classified: Secondary | ICD-10-CM | POA: Diagnosis present

## 2023-06-24 DIAGNOSIS — H9190 Unspecified hearing loss, unspecified ear: Secondary | ICD-10-CM | POA: Diagnosis present

## 2023-06-24 DIAGNOSIS — Z87440 Personal history of urinary (tract) infections: Secondary | ICD-10-CM

## 2023-06-24 DIAGNOSIS — N6310 Unspecified lump in the right breast, unspecified quadrant: Secondary | ICD-10-CM

## 2023-06-24 DIAGNOSIS — Z823 Family history of stroke: Secondary | ICD-10-CM

## 2023-06-24 DIAGNOSIS — Z803 Family history of malignant neoplasm of breast: Secondary | ICD-10-CM

## 2023-06-24 DIAGNOSIS — Z800 Family history of malignant neoplasm of digestive organs: Secondary | ICD-10-CM

## 2023-06-24 DIAGNOSIS — Z8049 Family history of malignant neoplasm of other genital organs: Secondary | ICD-10-CM

## 2023-06-24 DIAGNOSIS — Z888 Allergy status to other drugs, medicaments and biological substances status: Secondary | ICD-10-CM

## 2023-06-24 DIAGNOSIS — I10 Essential (primary) hypertension: Secondary | ICD-10-CM | POA: Diagnosis present

## 2023-06-24 DIAGNOSIS — R739 Hyperglycemia, unspecified: Secondary | ICD-10-CM

## 2023-06-24 DIAGNOSIS — R1010 Upper abdominal pain, unspecified: Principal | ICD-10-CM

## 2023-06-24 DIAGNOSIS — Z8249 Family history of ischemic heart disease and other diseases of the circulatory system: Secondary | ICD-10-CM

## 2023-06-24 DIAGNOSIS — Z6837 Body mass index (BMI) 37.0-37.9, adult: Secondary | ICD-10-CM

## 2023-06-24 DIAGNOSIS — Z91014 Allergy to mammalian meats: Secondary | ICD-10-CM

## 2023-06-24 DIAGNOSIS — Z801 Family history of malignant neoplasm of trachea, bronchus and lung: Secondary | ICD-10-CM

## 2023-06-24 LAB — URINALYSIS, ROUTINE W REFLEX MICROSCOPIC
Bilirubin Urine: NEGATIVE
Glucose, UA: NEGATIVE mg/dL
Hgb urine dipstick: NEGATIVE
Ketones, ur: NEGATIVE mg/dL
Leukocytes,Ua: NEGATIVE
Nitrite: NEGATIVE
Protein, ur: NEGATIVE mg/dL
Specific Gravity, Urine: 1.019 (ref 1.005–1.030)
pH: 6 (ref 5.0–8.0)

## 2023-06-24 LAB — COMPREHENSIVE METABOLIC PANEL WITH GFR
ALT: 449 U/L — ABNORMAL HIGH (ref 0–44)
AST: 335 U/L — ABNORMAL HIGH (ref 15–41)
Albumin: 3.8 g/dL (ref 3.5–5.0)
Alkaline Phosphatase: 196 U/L — ABNORMAL HIGH (ref 38–126)
Anion gap: 10 (ref 5–15)
BUN: 10 mg/dL (ref 8–23)
CO2: 24 mmol/L (ref 22–32)
Calcium: 9.7 mg/dL (ref 8.9–10.3)
Chloride: 101 mmol/L (ref 98–111)
Creatinine, Ser: 0.65 mg/dL (ref 0.44–1.00)
GFR, Estimated: >60 mL/min (ref >60)
Glucose, Bld: 141 mg/dL — ABNORMAL HIGH (ref 70–99)
Potassium: 3.7 mmol/L (ref 3.5–5.1)
Sodium: 135 mmol/L (ref 135–145)
Total Bilirubin: 2.7 mg/dL — ABNORMAL HIGH (ref 0.0–1.2)
Total Protein: 8.1 g/dL (ref 6.5–8.1)

## 2023-06-24 LAB — CBC
HCT: 38.8 % (ref 36.0–46.0)
Hemoglobin: 12.5 g/dL (ref 12.0–15.0)
MCH: 26.9 pg (ref 26.0–34.0)
MCHC: 32.2 g/dL (ref 30.0–36.0)
MCV: 83.6 fL (ref 80.0–100.0)
Platelets: 259 10*3/uL (ref 150–400)
RBC: 4.64 MIL/uL (ref 3.87–5.11)
RDW: 14.8 % (ref 11.5–15.5)
WBC: 9 10*3/uL (ref 4.0–10.5)
nRBC: 0 % (ref 0.0–0.2)

## 2023-06-24 LAB — LIPASE, BLOOD: Lipase: 130 U/L — ABNORMAL HIGH (ref 11–51)

## 2023-06-24 MED ORDER — ONDANSETRON HCL 4 MG/2ML IJ SOLN
4.0000 mg | Freq: Once | INTRAMUSCULAR | Status: AC
  Administered 2023-06-25: 4 mg via INTRAVENOUS
  Filled 2023-06-24 (×2): qty 2

## 2023-06-24 MED ORDER — MORPHINE SULFATE (PF) 4 MG/ML IV SOLN
4.0000 mg | Freq: Once | INTRAVENOUS | Status: AC
  Administered 2023-06-25: 4 mg via INTRAVENOUS
  Filled 2023-06-24 (×2): qty 1

## 2023-06-24 NOTE — ED Triage Notes (Signed)
 The pt had her gallbladder  removed 12 days ago since yesterday she has had increased and pain and rt flank  she reports  discolored urine  no temp

## 2023-06-24 NOTE — ED Provider Notes (Signed)
 [facility] AT [facility] Provider Note   CSN: [id number] Arrival date & time: 06/24/23  2250     History {Add pertinent medical, surgical, social history, OB history to HPI:1} Chief Complaint  Patient presents with   Abdominal Pain    Jackie Lewis is a 68 y.o. female.  The history is provided by the patient.  Abdominal Pain She has history of hypertension, diabetes, GERD and is 12 days status post laparoscopic cholecystectomy and comes in complaining of upper abdominal pain which started yesterday.  Pain only lasted a few hours but recurred today and has been present all day.  Pain does radiate to her back.  There is associated nausea but no vomiting.  She is also noted that her urine has turned orange in color.  She denies fever, chills, sweats.  She has not taken anything for pain or nausea.   Home Medications Prior to Admission medications   Medication Sig Start Date End Date Taking? Authorizing Provider  albuterol  (VENTOLIN  HFA) 108 (90 Base) MCG/ACT inhaler Inhale 1-2 puffs into the lungs every 6 (six) hours as needed for wheezing or shortness of breath. Patient not taking: Reported on 05/09/2023 05/21/19   Panosh, Wanda K, MD  Cholecalciferol (VITAMIN D3) 1000 units CAPS Take 1,000 Units by mouth daily.    [provider]  fluticasone  (FLONASE ) 50 MCG/ACT nasal spray SPRAY 2 SPRAYS INTO EACH NOSTRIL EVERY DAY Patient not taking: No sig reported 09/10/19   Denson Flake, MD  hyoscyamine  (LEVSIN/SL) 0.125 MG SL tablet Place 1 tablet (0.125 mg total) under the tongue every 4 (four) hours as needed for cramping (pain). Up to 1.25 mg daily Patient not taking: Reported on 05/17/2023 05/12/23   Harris, Abigail, PA-C  ipratropium (ATROVENT ) 0.03 % nasal spray Place 2 sprays into both nostrils every 12 (twelve) hours. Patient not taking: Reported on 06/05/2023 08/17/22   Mardene Shake, FNP  levothyroxine  (SYNTHROID ) 88 MCG tablet Take 1 tablet (88 mcg  total) by mouth daily before breakfast. 06/06/23   Panosh, Wanda K, MD  omeprazole  (PRILOSEC) 40 MG capsule TAKE 1 CAPSULE (40 MG TOTAL) BY MOUTH IN THE MORNING Patient not taking: Reported on 05/17/2023 03/24/23   Panosh, Joaquim Muir, MD  ondansetron  (ZOFRAN ) 4 MG tablet Take 1 tablet (4 mg total) by mouth every 8 (eight) hours as needed for nausea or vomiting. Patient not taking: Reported on 05/17/2023 05/12/23   Harris, Abigail, PA-C  traMADol  (ULTRAM ) 50 MG tablet Take 1 tablet (50 mg total) by mouth every 6 (six) hours as needed. 06/13/23   Oza Blumenthal, MD  triamcinolone  (KENALOG ) 0.025 % cream APPLY TO AFFECTED AREA TWICE A DAY AS DIRECTED 11/17/22   Webb, Padonda B, FNP      Allergies    Epinephrine, Nitrofurantoin monohyd macro, Epinephrine, and Pork-derived products    Review of Systems   Review of Systems  Gastrointestinal:  Positive for abdominal pain.  All other systems reviewed and are negative.   Physical Exam Updated Vital Signs BP (!) 166/80 (BP Location: Left Wrist)   Pulse 90   Temp 97.6 F (36.4 C)   Resp (!) 22   Ht 5\' 1"  (1.549 m)   Wt 90.7 kg   SpO2 99%   BMI 37.78 kg/m  Physical Exam Vitals and nursing note reviewed.   68 year old female, resting comfortably and in no acute distress. Vital signs are significant for elevated blood pressure and borderline elevated respiratory rate. Oxygen saturation is  99%, which is normal. Head is normocephalic and atraumatic. PERRLA, EOMI. Oropharynx is clear.  Sclerae appear slightly icteric. Back is nontender and there is no CVA tenderness. Lungs are clear without rales, wheezes, or rhonchi. Chest is nontender. Heart has regular rate and rhythm without murmur. Abdomen is soft, flat, with mild tenderness in the epigastric area and right upper quadrant.  There is no rebound or guarding. Extremities have no cyanosis or edema. Skin is warm and dry without rash. Neurologic: Awake and alert, moves all extremities equally.  ED  Results / Procedures / Treatments   Labs (all labs ordered are listed, but only abnormal results are displayed) Labs Reviewed  LIPASE, BLOOD  COMPREHENSIVE METABOLIC PANEL WITH GFR  CBC  URINALYSIS, ROUTINE W REFLEX MICROSCOPIC    EKG None  Radiology US  BREAST ASPIRATION RIGHT Result Date: 06/23/2023 CLINICAL DATA:  68 year old female presenting for ultrasound-guided aspiration of a screen detected mass in the RIGHT breast. EXAM: ULTRASOUND GUIDED RIGHT BREAST CYST ASPIRATION COMPARISON:  Previous exam(s). PROCEDURE: The patient and I discussed the procedure of ultrasound-guided aspiration including benefits and alternatives. We discussed the high likelihood of a successful procedure. We discussed the risks of the procedure including infection, bleeding, tissue injury, and inadequate sampling. Informed written consent was given. The usual time out protocol was performed immediately prior to the procedure. Using sterile technique, 1% lidocaine , under direct ultrasound visualization, needle aspiration of the mass in the right breast 3 o'clock position 6 cm from nipple was performed. Approximately 1 cc of clear yellow fluid was aspirated, with complete resolution of the mass at the completion of the procedure. This confirms the mass is consistent with a benign cyst. Aspirated fluid had a benign appearance and was therefore discarded. IMPRESSION: Ultrasound-guided aspiration of a benign cyst in the RIGHT breast 3 o'clock position no apparent complications. RECOMMENDATIONS: Screening mammogram in 1 year. Electronically Signed   By: Sande Cromer M.D.   On: 06/23/2023 15:48    Procedures Procedures  Cardiac monitor shows normal sinus rhythm, per my interpretation.  Medications Ordered in ED Medications  morphine (PF) 4 MG/ML injection 4 mg (has no administration in time range)  ondansetron  (ZOFRAN ) injection 4 mg (has no administration in time range)    ED Course/ Medical Decision Making/  A&P   {   Click here for ABCD2, HEART and other calculatorsREFRESH Note before signing :1}                              Medical Decision Making  Upper abdominal pain and patient who is status post recent laparoscopic cholecystectomy.  Change in urine color is concerning for jaundice, concern is for common bile duct stone.  I have ordered laboratory workup of CBC, comprehensive metabolic panel, lipase, urinalysis and I have ordered right upper quadrant ultrasound.  I have ordered morphine for pain and ondansetron  for nausea.  {Document critical care time when appropriate:1} {Document review of labs and clinical decision tools ie heart score, Chads2Vasc2 etc:1}  {Document your independent review of radiology images, and any outside records:1} {Document your discussion with family members, caretakers, and with consultants:1} {Document social determinants of health affecting pt's care:1} {Document your decision making why or why not admission, treatments were needed:1} Final Clinical Impression(s) / ED Diagnoses Final diagnoses:  None    Rx / DC Orders ED Discharge Orders     None

## 2023-06-25 ENCOUNTER — Inpatient Hospital Stay (HOSPITAL_COMMUNITY)

## 2023-06-25 ENCOUNTER — Encounter (HOSPITAL_COMMUNITY): Payer: Self-pay | Admitting: Family Medicine

## 2023-06-25 ENCOUNTER — Emergency Department (HOSPITAL_COMMUNITY)

## 2023-06-25 ENCOUNTER — Other Ambulatory Visit: Payer: Self-pay

## 2023-06-25 DIAGNOSIS — E119 Type 2 diabetes mellitus without complications: Secondary | ICD-10-CM | POA: Diagnosis present

## 2023-06-25 DIAGNOSIS — K838 Other specified diseases of biliary tract: Secondary | ICD-10-CM | POA: Diagnosis not present

## 2023-06-25 DIAGNOSIS — K831 Obstruction of bile duct: Secondary | ICD-10-CM

## 2023-06-25 DIAGNOSIS — Z800 Family history of malignant neoplasm of digestive organs: Secondary | ICD-10-CM | POA: Diagnosis not present

## 2023-06-25 DIAGNOSIS — Z801 Family history of malignant neoplasm of trachea, bronchus and lung: Secondary | ICD-10-CM | POA: Diagnosis not present

## 2023-06-25 DIAGNOSIS — I10 Essential (primary) hypertension: Secondary | ICD-10-CM | POA: Diagnosis present

## 2023-06-25 DIAGNOSIS — R17 Unspecified jaundice: Secondary | ICD-10-CM | POA: Diagnosis present

## 2023-06-25 DIAGNOSIS — Z803 Family history of malignant neoplasm of breast: Secondary | ICD-10-CM | POA: Diagnosis not present

## 2023-06-25 DIAGNOSIS — K859 Acute pancreatitis without necrosis or infection, unspecified: Secondary | ICD-10-CM

## 2023-06-25 DIAGNOSIS — E039 Hypothyroidism, unspecified: Secondary | ICD-10-CM

## 2023-06-25 DIAGNOSIS — Z87440 Personal history of urinary (tract) infections: Secondary | ICD-10-CM | POA: Diagnosis not present

## 2023-06-25 DIAGNOSIS — K760 Fatty (change of) liver, not elsewhere classified: Secondary | ICD-10-CM | POA: Diagnosis present

## 2023-06-25 DIAGNOSIS — H9190 Unspecified hearing loss, unspecified ear: Secondary | ICD-10-CM | POA: Diagnosis present

## 2023-06-25 DIAGNOSIS — Z9049 Acquired absence of other specified parts of digestive tract: Secondary | ICD-10-CM | POA: Diagnosis not present

## 2023-06-25 DIAGNOSIS — Z8249 Family history of ischemic heart disease and other diseases of the circulatory system: Secondary | ICD-10-CM | POA: Diagnosis not present

## 2023-06-25 DIAGNOSIS — R748 Abnormal levels of other serum enzymes: Secondary | ICD-10-CM | POA: Diagnosis not present

## 2023-06-25 DIAGNOSIS — Z8049 Family history of malignant neoplasm of other genital organs: Secondary | ICD-10-CM | POA: Diagnosis not present

## 2023-06-25 DIAGNOSIS — Z823 Family history of stroke: Secondary | ICD-10-CM | POA: Diagnosis not present

## 2023-06-25 DIAGNOSIS — Z79890 Hormone replacement therapy (postmenopausal): Secondary | ICD-10-CM | POA: Diagnosis not present

## 2023-06-25 DIAGNOSIS — Z91014 Allergy to mammalian meats: Secondary | ICD-10-CM | POA: Diagnosis not present

## 2023-06-25 DIAGNOSIS — K8689 Other specified diseases of pancreas: Secondary | ICD-10-CM | POA: Diagnosis present

## 2023-06-25 DIAGNOSIS — R7989 Other specified abnormal findings of blood chemistry: Secondary | ICD-10-CM | POA: Diagnosis not present

## 2023-06-25 DIAGNOSIS — Z888 Allergy status to other drugs, medicaments and biological substances status: Secondary | ICD-10-CM | POA: Diagnosis not present

## 2023-06-25 DIAGNOSIS — E66812 Obesity, class 2: Secondary | ICD-10-CM | POA: Diagnosis present

## 2023-06-25 DIAGNOSIS — Z6837 Body mass index (BMI) 37.0-37.9, adult: Secondary | ICD-10-CM | POA: Diagnosis not present

## 2023-06-25 DIAGNOSIS — K851 Biliary acute pancreatitis without necrosis or infection: Secondary | ICD-10-CM | POA: Diagnosis present

## 2023-06-25 LAB — COMPREHENSIVE METABOLIC PANEL WITH GFR
ALT: 458 U/L — ABNORMAL HIGH (ref 0–44)
AST: 328 U/L — ABNORMAL HIGH (ref 15–41)
Albumin: 3.5 g/dL (ref 3.5–5.0)
Alkaline Phosphatase: 179 U/L — ABNORMAL HIGH (ref 38–126)
Anion gap: 11 (ref 5–15)
BUN: 10 mg/dL (ref 8–23)
CO2: 24 mmol/L (ref 22–32)
Calcium: 9.2 mg/dL (ref 8.9–10.3)
Chloride: 102 mmol/L (ref 98–111)
Creatinine, Ser: 0.71 mg/dL (ref 0.44–1.00)
GFR, Estimated: >60 mL/min (ref >60)
Glucose, Bld: 139 mg/dL — ABNORMAL HIGH (ref 70–99)
Potassium: 3.8 mmol/L (ref 3.5–5.1)
Sodium: 137 mmol/L (ref 135–145)
Total Bilirubin: 3.6 mg/dL — ABNORMAL HIGH (ref 0.0–1.2)
Total Protein: 7.2 g/dL (ref 6.5–8.1)

## 2023-06-25 LAB — CBC
HCT: 39 % (ref 36.0–46.0)
Hemoglobin: 12.8 g/dL (ref 12.0–15.0)
MCH: 27 pg (ref 26.0–34.0)
MCHC: 32.8 g/dL (ref 30.0–36.0)
MCV: 82.3 fL (ref 80.0–100.0)
Platelets: 229 10*3/uL (ref 150–400)
RBC: 4.74 MIL/uL (ref 3.87–5.11)
RDW: 14.8 % (ref 11.5–15.5)
WBC: 8.4 10*3/uL (ref 4.0–10.5)
nRBC: 0 % (ref 0.0–0.2)

## 2023-06-25 LAB — HIV ANTIBODY (ROUTINE TESTING W REFLEX): HIV Screen 4th Generation wRfx: NONREACTIVE

## 2023-06-25 LAB — MAGNESIUM: Magnesium: 1.9 mg/dL (ref 1.7–2.4)

## 2023-06-25 MED ORDER — ONDANSETRON HCL 4 MG PO TABS: 4.0000 mg | ORAL_TABLET | Freq: Four times a day (QID) | ORAL | Status: DC | PRN

## 2023-06-25 MED ORDER — GADOBUTROL 1 MMOL/ML IV SOLN
10.0000 mL | Freq: Once | INTRAVENOUS | Status: AC | PRN
  Administered 2023-06-25: 10 mL via INTRAVENOUS

## 2023-06-25 MED ORDER — LACTATED RINGERS IV SOLN: INTRAVENOUS | Status: AC

## 2023-06-25 MED ORDER — FENTANYL CITRATE PF 50 MCG/ML IJ SOSY
12.5000 ug | PREFILLED_SYRINGE | INTRAMUSCULAR | Status: DC | PRN
  Administered 2023-06-25: 25 ug via INTRAVENOUS
  Filled 2023-06-25: qty 1

## 2023-06-25 MED ORDER — IBUPROFEN 400 MG PO TABS: 400.0000 mg | ORAL_TABLET | Freq: Four times a day (QID) | ORAL | Status: DC | PRN

## 2023-06-25 MED ORDER — LEVOTHYROXINE SODIUM 88 MCG PO TABS
88.0000 ug | ORAL_TABLET | Freq: Every day | ORAL | Status: DC
  Administered 2023-06-25 – 2023-06-26 (×2): 88 ug via ORAL
  Filled 2023-06-25 (×2): qty 1

## 2023-06-25 MED ORDER — ONDANSETRON HCL 4 MG/2ML IJ SOLN: 4.0000 mg | Freq: Four times a day (QID) | INTRAMUSCULAR | Status: DC | PRN

## 2023-06-25 NOTE — Progress Notes (Signed)
 New Admission Note:   Arrival Method: Via stretcher from the ED Mental Orientation:  A & O x4 Telemetry: None Assessment: Completed Skin:  Scabbed Lap Cholecystectomy Scars - Procedure 06/13/2023 IV:  LAFA PIV Pain: Mid Upper Abdominal Pain Tubes:  None Safety Measures: Safety Fall Prevention Plan has been given, discussed and signed Admission: Completed 5 MW Orientation: Patient has been orientated to the room, unit and staff.  Family:  Daughter at bedside  Patient is able to ambulate to bathroom with standby assistance.  Cell phone and clothing at the bedside.  Orders have been reviewed and implemented. Will continue to monitor the patient. Call light has been placed within reach and bed alarm has been activated.   Necia Bali RN Phone number: [phone]

## 2023-06-25 NOTE — Progress Notes (Signed)
 No charge  Patient seen and examined this morning, events overnight, H&P reviewed and I agree with the assessment and plan.  68 year old female with history of hypothyroidism, symptomatic cholelithiasis status postcholecystectomy 4/29 comes in upper abdominal pain, nausea.  She recovered well, the day prior to admission a little while after she had food has experienced upper abdomen pain, wrapping around her back, constant, severe.  She has some nausea but no vomiting.  She was found to have elevated LFTs, CBD and intrahepatic duct dilation, GI and surgery were consulted and she was admitted to the hospital  Principal problem Acute pancreatitis -MRCP showing pancreatic inflammation, it would be quite unusual for this to be a postoperative complication but it can happen.  Alternatively I wonder whether she had a choledocholithiasis and has passed a stone recently.  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  She would benefit from weight loss  Elevated LFTs-still high today, continue to monitor  Juanice Warburton M. Aldona Amel, MD, PhD [facility]  Between 7 am - 7 pm you can contact me via Amion (for emergencies) or Securechat (non urgent matters).  I am not available 7 pm - 7 am, please contact night coverage MD/APP via Amion

## 2023-06-25 NOTE — Plan of Care (Signed)

## 2023-06-25 NOTE — Consult Note (Addendum)
 Attending physician's note   I have taken a history, reviewed the chart, and examined the patient. I performed a substantive portion of this encounter, including complete performance of at least one of the key components, in conjunction with the APP. I agree with the APP's note, impression, and recommendations with my edits.   68 year old female with medical history as outlined below, including recent laparoscopic cholecystectomy on 4/29 (symptomatic cholelithiasis), presents with epigastric pain radiating to the back/shoulder and nausea.  She had been doing well after her cholecystectomy, but onset of symptoms on 06/23/23, and worsening pain after breakfast on 5/10, prompting her to come to the ER.  Admission evaluation notable for the following: - WBC 9, H/H 12.5/38.8 - AST/ALT 335/449, ALP 196 --> 328/458/179 today - T. bili 2.7 --> 3.6 today - Lipase 130 - MRCP: 1 cm CBD, mild intrahepatic duct dilation, no choledocholithiasis, diffuse pancreatic edema with stranding and fluid c/w acute pancreatitis, but no PD dilation.  Patient reports pain has centrally resolved.  Asking to try eating again.  1) Elevated liver enzymes 2) Elevated bilirubin 3) Cholelithiasis s/p cholecystectomy 4) Mild acute pancreatitis Recent laparoscopic ccy on 06/13/2023, presents with acute onset epigastric pain and nausea without emesis.  Not sure if this represents retained stone that has since passed (no CDL on MRCP) or if symptoms were more so attributed to the mild pancreatitis noted on MRCP (would suspect gallstone pancreatitis).  Lipase only mildly elevated 130.  No hemoconcentration, normal renal function.  BISAP score 1.  Nonetheless, symptoms much improved. - Will trial clears - Pain control and antiemetics per primary Hospital service - Continue IV fluids until pain resolved and tolerating p.o. - Daily liver enzymes, CBC, BMP - No plan for ERCP at this juncture - GI service will continue to follow.   Dr. General Kenner will assume her ongoing inpatient GI care starting tomorrow morning  Harry Lindau, DO, [place] [phone] office         Consultation  Referring Provider: [facility] Primary Care Physician:  Reginal Capra, MD Primary Gastroenterologist: [facility]  Reason for Consultation: Acute right upper quadrant abdominal pain and nausea and patient status post cystectomy on 06/13/2023, elevated LFTs and lipase.  HPI: Jackie Lewis is a 68 y.o. female, generally in good health with history of hypothyroidism and hypertension who underwent laparoscopic cholecystectomy on 06/13/2023 for symptomatic cholelithiasis.  At time of surgery she was found to have dense of chronic cholecystitis and cholelithiasis.  Preop labs showed normal LFTs, no IOC was done.  Patient says she felt well until Friday, 06/23/2023 when she developed nausea, queasiness and mild abdominal discomfort.  Yesterday morning she ate breakfast and then developed more intense epigastric pain radiating to her back and shoulder associated with nausea but no vomiting.  This pain progressed, was severe and she presented to the emergency room. Labs showed elevated lipase of 130 WBC 9.0/hemoglobin 12.5/macro 38.8/platelets 259 Potassium 3.7/BUN 10/creatinine 0.65 T. bili 2.7/alk phos 196/AST 335/ALT 449  MRI/MRCP last evening showed diffuse hepatic steatosis, no focal enhancing liver lesions, status postcholecystectomy, there is mild common bile duct dilation up to 1 cm, previously 8 mm and mild intrahepatic ductal dilation, no choledocholithiasis.  Diffuse periportal edema noted and evidence for diffuse pancreatic edema with peripancreatic fat stranding and fluid.  There is fluid and tissue stranding into the small bowel mesentery and bilateral retroperitoneum, no evidence of pancreatic necrosis  Patient says she feels better today she is having significantly less abdominal pain,  is not nauseated and feels that she  could tolerate something to eat.  Labs today-BBC 8.4/hemoglobin 12.8/hematocrit 39.0 T. bili 3.6/alk phos 179/AST 328/ALT 458 stable No lipase this a.m.   Past Medical History:  Diagnosis Date   Abnormal thyroid  blood test    Allergic rhinitis    Allergy    Anxiety    Arthritis    Carpal tunnel    Bil   Complication of anesthesia    years ago, she was awake when she was intubate for ear surgery, terrified that this will happen again   Diabetes mellitus without complication (HCC)    GERD (gastroesophageal reflux disease)    Headache(784.0)    Hearing loss    Bil/ worse on left side.   Hypertension    Hypothyroidism    Recurrent UTI    Scleritis    hx   Thyroid  disease     Past Surgical History:  Procedure Laterality Date   CHOLECYSTECTOMY N/A 06/13/2023   Procedure: LAPAROSCOPIC CHOLECYSTECTOMY;  Surgeon: Oza Blumenthal, MD;  Location: [facility]S;  Service: General;  Laterality: N/A;  LAPAROSCOPIC CHOLECYSTECTOMY   ESOPHAGOGASTRODUODENOSCOPY  04/2007   EXCISION RADIAL HEAD  2015   right elbow   FRACTURE SURGERY Right    ORIF at elbow   INNER EAR SURGERY     hole in right ear/ surg left ear 2-3 times   INNER EAR SURGERY     numerous including reconstruction of left inner ear   TUBAL LIGATION     UPPER GASTROINTESTINAL ENDOSCOPY      Prior to Admission medications   Medication Sig Start Date End Date Taking? Authorizing Provider  Ca Phosphate-Cholecalciferol (EQL CALCIUM GUMMIES PO) Take 2 tablets by mouth daily.   Yes [provider]  Cholecalciferol (VITAMIN D3) 1000 units CAPS Take 1,000 Units by mouth daily.   Yes [provider]  levothyroxine  (SYNTHROID ) 88 MCG tablet Take 1 tablet (88 mcg total) by mouth daily before breakfast. 06/06/23  Yes Panosh, Wanda K, MD  omeprazole  (PRILOSEC) 40 MG capsule TAKE 1 CAPSULE (40 MG TOTAL) BY MOUTH IN THE MORNING 03/24/23  Yes Panosh, Wanda K, MD  traMADol  (ULTRAM ) 50 MG tablet Take 1 tablet (50 mg total) by  mouth every 6 (six) hours as needed. 06/13/23  Yes Oza Blumenthal, MD  triamcinolone  (KENALOG ) 0.025 % cream APPLY TO AFFECTED AREA TWICE A DAY AS DIRECTED Patient taking differently: Apply 1 Application topically daily as needed (for itching). 11/17/22  Yes Webb, Padonda B, FNP    Current Facility-Administered Medications  Medication Dose Route Frequency Provider Last Rate Last Admin   fentaNYL  (SUBLIMAZE ) injection 12.5-50 mcg  12.5-50 mcg Intravenous Q2H PRN Opyd, Timothy S, MD   25 mcg at 06/25/23 0450   ibuprofen  (ADVIL ) tablet 400 mg  400 mg Oral Q6H PRN Opyd, Timothy S, MD       lactated ringers  infusion   Intravenous Continuous Opyd, Timothy S, MD 85 mL/hr at 06/25/23 0454 New Bag at 06/25/23 0454   levothyroxine  (SYNTHROID ) tablet 88 mcg  88 mcg Oral QAC breakfast Opyd, Timothy S, MD   88 mcg at 06/25/23 0831   ondansetron  (ZOFRAN ) tablet 4 mg  4 mg Oral Q6H PRN Opyd, Timothy S, MD       Or   ondansetron  (ZOFRAN ) injection 4 mg  4 mg Intravenous Q6H PRN Opyd, Santana Cue, MD        Allergies as of 06/24/2023 - Review Complete 06/24/2023  Allergen Reaction Noted  Epinephrine  04/29/2008   Nitrofurantoin monohyd macro Rash 06/29/2010   Epinephrine  05/12/2023   Pork-derived products  06/09/2023    Family History  Problem Relation Age of Onset   Stroke Father    Parkinsonism Father    Uterine cancer Sister 38   Heart disease Sister    Colon cancer Maternal Aunt    Breast cancer Paternal Aunt    Lung cancer Paternal Uncle        heavy smoker   Hemolytic uremic syndrome Daughter        atypical  under rx    Breast cancer Daughter 54       CHEK2+   Other Daughter        CHEK2+   Other Son        traumatic brain injury 2011   Breast cancer Cousin        paternal   Prostate cancer Cousin 58       maternal first cousin (also her husband), CHEK2+   Thyroid  disease Other    Hypertension Other    Hyperlipidemia Other     Social History   Socioeconomic History    Marital status: Married    Spouse name: Not on file   Number of children: Not on file   Years of education: Not on file   Highest education level: Not on file  Occupational History   Not on file  Tobacco Use   Smoking status: Never   Smokeless tobacco: Never  Vaping Use   Vaping status: Never Used  Substance and Sexual Activity   Alcohol use: Never   Drug use: Never   Sexual activity: Not Currently  Other Topics Concern   Not on file  Social History Narrative   ** Merged History Encounter **       Married hh of 3- Regular exercise- no 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 Daughter graduated from [employer]  grad school in [employer]   Got hus living in [place] Daughter   In [place]   Social Drivers of Health   Financial Resource Strain: Low Risk  (06/01/2022)   Overall Financial Resource Strain (CARDIA)    Difficulty of Paying Living Expenses: Not very hard  Food Insecurity: No Food Insecurity (06/25/2023)   Hunger Vital Sign    Worried About Running Out of Food in the Last Year: Never true    Ran Out of Food in the Last Year: Never true  Transportation Needs: No Transportation Needs (06/25/2023)   PRAPARE - Administrator, Civil Service (Medical): No    Lack of Transportation (Non-Medical): No  Physical Activity: Inactive (12/26/2022)   Exercise Vital Sign    Days of Exercise per Week: 0 days    Minutes of Exercise per Session: 0 min  Stress: No Stress Concern Present (12/26/2022)   [employer] of Occupational Health - Occupational Stress Questionnaire    Feeling of Stress : Only a little  Social Connections: Moderately Isolated (06/25/2023)   Social Connection and Isolation Panel [NHANES]     Frequency of Communication with Friends and Family: More than three times a  week    Frequency of Social Gatherings with Friends and Family: Twice a week    Attends Religious Services: Never    Database administrator or Organizations: No    Attends Banker Meetings: Never    Marital Status: Married  Catering manager Violence: Not At Risk (06/25/2023)   Humiliation, Afraid, Rape, and Kick questionnaire    Fear of Current or Ex-Partner: No    Emotionally Abused: No    Physically Abused: No    Sexually Abused: No    Review of Systems: Pertinent positive and negative review of systems were noted in the above HPI section.  All other review of systems was otherwise negative.   Physical Exam: Vital signs in last 24 hours: Temp:  [97.6 F (36.4 C)-98.4 F (36.9 C)] 98.2 F (36.8 C) (05/11 0828) Pulse Rate:  [85-93] 85 (05/11 0828) Resp:  [17-26] 19 (05/11 0828) BP: (113-167)/(71-84) 113/71 (05/11 0828) SpO2:  [94 %-100 %] 94 % (05/11 0828) Weight:  [90.7 kg-91.3 kg] 91.3 kg (05/11 0304) Last BM Date : 06/23/23 General:   Alert,  Well-developed, older female, pleasant and cooperative in NAD-daughter at bedside Head:  Normocephalic and atraumatic. Eyes:  Sclera clear, no icterus.   Conjunctiva pink. Ears:  Normal auditory acuity. Nose:  No deformity, discharge,  or lesions. Mouth:  No deformity or lesions.   Neck:  Supple; no masses or thyromegaly. Lungs:  Clear throughout to auscultation.   No wheezes, crackles, or rhonchi.  Heart:  Regular rate and rhythm; no murmurs, clicks, rubs,  or gallops. Abdomen: Soft, bowel sounds are present, she does have tenderness across the upper abdomen, no guarding or rebound, no mass or hepatosplenomegaly Rectal:  not done Msk:  Symmetrical without gross deformities. . Pulses:  Normal pulses noted. Extremities:  Without clubbing or edema. Neurologic:  Alert and  oriented x4;  grossly normal neurologically. Skin:  Intact without  significant lesions or rashes.. Psych:  Alert and cooperative. Normal mood and affect.  Intake/Output from previous day: 05/10 0701 - 05/11 0700 In: 78.7 [P.O.:60; I.V.:18.7] Out: 400 [Urine:400] Intake/Output this shift: No intake/output data recorded.  Lab Results: Recent Labs    06/24/23 2315 06/25/23 0932  WBC 9.0 8.4  HGB 12.5 12.8  HCT 38.8 39.0  PLT 259 229   BMET Recent Labs    06/24/23 2315 06/25/23 0932  NA 135 137  K 3.7 3.8  CL 101 102  CO2 24 24  GLUCOSE 141* 139*  BUN 10 10  CREATININE 0.65 0.71  CALCIUM 9.7 9.2   LFT Recent Labs    06/25/23 0932  PROT 7.2  ALBUMIN 3.5  AST 328*  ALT 458*  ALKPHOS 179*  BILITOT 3.6*   PT/INR No results for input(s): "LABPROT", "INR" in the last 72 hours. Hepatitis Panel No results for input(s): "HEPBSAG", "HCVAB", "HEPAIGM", "HEPBIGM" in the last 72 hours.    IMPRESSION:  #68 68 year old female status post laparoscopic cholecystectomy on 06/13/2023 for symptomatic cholelithiasis.  Patient did well with surgery, had normal LFTs preoperatively. Patient developed acute onset of nausea and mild epigastric pain on Friday, 06/23/2023 which then worsened yesterday after she ate breakfast, was intense constant and unrelenting and presented to the emergency room.  Noted to have elevated LFTs as above and elevated lipase at 130. MRI/MRCP shows mild common bile duct dilation but no choledocholithiasis.  She does have evidence of acute pancreatitis with peripancreatic fluid and edema.  Suspect that she may have passed a stone  or sludge, precipitating acute pancreatitis  LFTs are stable today but not yet trending down, patient feels much better  #2 hypothyroidism #3.  Hypertension  PLAN: Start clear liquid diet Analgesics and antiemetics as needed Lactated Ringer's today at 150 an hour for the next 24 hours Follow-up labs in a.m. Need to follow over the next 24 to 48 hours, hopefully LFTs will start trending  down. At this time no indication for ERCP, GI will continue to follow with you   Jackie EsterwoodPA-C  06/25/2023, 11:14 AM

## 2023-06-25 NOTE — Progress Notes (Signed)
 Assessment & Plan: 68 yo female s/p lap chole by Dr. Lucienne Ryder - AST/ALT elevation, lipase elevation and T bili of 2.7 - USN = CBD is dilated to 12 mm from 5.8 mm previously - MRI/MRCP - acute pancreatitis, no evidence choledocholithiasis   FEN - CLD VTE - lovenox ID - none  Patient now essentially pain free.  Exam with mild tenderness.  Likely passed retained CBD stone.  Discussed with patient and family at bedside.  Will start CLD now.  Repeat labs in AM.  If continued improvement, advance diet in AM and possible discharge later tomorrow.        Jackie Billow, MD [facility] A [facility] practice Office: [phone]        Chief Complaint: Abd pain after lap chole  Subjective: Patient in bed, more comfortable.  Family at bedside.  Objective: Vital signs in last 24 hours: Temp:  [97.6 F (36.4 C)-98.4 F (36.9 C)] 98.2 F (36.8 C) (05/11 0828) Pulse Rate:  [85-93] 85 (05/11 0828) Resp:  [17-26] 19 (05/11 0828) BP: (113-167)/(71-84) 113/71 (05/11 0828) SpO2:  [94 %-100 %] 94 % (05/11 0828) Weight:  [90.7 kg-91.3 kg] 91.3 kg (05/11 0304) Last BM Date : 06/23/23  Intake/Output from previous day: 05/10 0701 - 05/11 0700 In: 78.7 [P.O.:60; I.V.:18.7] Out: 400 [Urine:400] Intake/Output this shift: No intake/output data recorded.  Physical Exam: Abdomen - soft, non-distended, minimal tenderness  Lab Results:  Recent Labs    06/24/23 2315 06/25/23 0932  WBC 9.0 8.4  HGB 12.5 12.8  HCT 38.8 39.0  PLT 259 229   BMET Recent Labs    06/24/23 2315 06/25/23 0932  NA 135 137  K 3.7 3.8  CL 101 102  CO2 24 24  GLUCOSE 141* 139*  BUN 10 10  CREATININE 0.65 0.71  CALCIUM 9.7 9.2   PT/INR No results for input(s): "LABPROT", "INR" in the last 72 hours. Comprehensive Metabolic Panel:    Component Value Date/Time   NA 137 06/25/2023 0932   NA 135 06/24/2023 2315   K 3.8 06/25/2023 0932   K 3.7 06/24/2023 2315   CL 102 06/25/2023 0932   CL  101 06/24/2023 2315   CO2 24 06/25/2023 0932   CO2 24 06/24/2023 2315   BUN 10 06/25/2023 0932   BUN 10 06/24/2023 2315   CREATININE 0.71 06/25/2023 0932   CREATININE 0.65 06/24/2023 2315   CREATININE 0.69 09/02/2019 1034   GLUCOSE 139 (H) 06/25/2023 0932   GLUCOSE 141 (H) 06/24/2023 2315   GLUCOSE 84 12/15/2005 1144   CALCIUM 9.2 06/25/2023 0932   CALCIUM 9.7 06/24/2023 2315   AST 328 (H) 06/25/2023 0932   AST 335 (H) 06/24/2023 2315   ALT 458 (H) 06/25/2023 0932   ALT 449 (H) 06/24/2023 2315   ALKPHOS 179 (H) 06/25/2023 0932   ALKPHOS 196 (H) 06/24/2023 2315   BILITOT 3.6 (H) 06/25/2023 0932   BILITOT 2.7 (H) 06/24/2023 2315   PROT 7.2 06/25/2023 0932   PROT 8.1 06/24/2023 2315   ALBUMIN 3.5 06/25/2023 0932   ALBUMIN 3.8 06/24/2023 2315    Studies/Results: MR ABDOMEN MRCP W WO CONTAST Result Date: 06/25/2023 CLINICAL DATA:  Jaundice. EXAM: MRI ABDOMEN WITHOUT AND WITH CONTRAST (INCLUDING MRCP) TECHNIQUE: Multiplanar multisequence MR imaging of the abdomen was performed both before and after the administration of intravenous contrast. Heavily T2-weighted images of the biliary and pancreatic ducts were obtained, and three-dimensional MRCP images were rendered by post processing. CONTRAST:  10mL GADAVIST  GADOBUTROL 1 MMOL/ML IV SOLN COMPARISON:  CT AP 03/17/2023 and right upper quadrant ultrasound from 06/25/2023 FINDINGS: Lower chest: No acute findings. Hepatobiliary: Diffuse hepatic steatosis. No focal enhancing liver lesion. Status post cholecystectomy. Mild common bile duct dilatation measures up to 1 cm, previously 8 mm. Mild intrahepatic bile duct dilatation. No choledocholithiasis. Diffuse periportal edema is noted. Pancreas: No main duct dilatation or mass identified. Diffuse pancreatic edema with peripancreatic fat stranding and fluid. Fluid and soft tissue stranding extends into the small bowel mesentery and bilateral retroperitoneum. No signs of pancreatic necrosis. No  pseudocyst identified at this time. Spleen:  Within normal limits in size and appearance. Adrenals/Urinary Tract: Normal adrenal glands. No obstructive uropathy. Small cyst within the anterior cortex of the upper pole of right kidney measures 5 mm, image 18/5. No follow-up imaging recommended. No suspicious enhancing kidney lesions identified. Stomach/Bowel: Stomach appears normal. No pathologic dilatation of the large or small bowel loops. Moderate retained stool identified within the ascending colon. Vascular/Lymphatic: No pathologically enlarged lymph nodes identified. No abdominal aortic aneurysm demonstrated. The upper abdominal vascularity appears patent. Other: No discrete fluid collections identified at this time. Perihepatic ascites. Musculoskeletal: No suspicious bone lesions identified. IMPRESSION: 1. Diffuse pancreatic edema with peripancreatic fat stranding and fluid. Fluid and soft tissue stranding extends into the small bowel mesentery and bilateral retroperitoneum. Findings are compatible with acute pancreatitis. No signs of pancreatic necrosis. No pseudocyst identified at this time. 2. Status post cholecystectomy. Mild common bile duct dilatation measures up to 1 cm, previously 8 mm. Mild intrahepatic bile duct dilatation. No choledocholithiasis. 3. Diffuse hepatic steatosis. 4. Moderate retained stool identified within the ascending colon. Electronically Signed   By: Kimberley Penman M.D.   On: 06/25/2023 06:45   MR 3D Recon At Scanner Result Date: 06/25/2023 CLINICAL DATA:  Jaundice. EXAM: MRI ABDOMEN WITHOUT AND WITH CONTRAST (INCLUDING MRCP) TECHNIQUE: Multiplanar multisequence MR imaging of the abdomen was performed both before and after the administration of intravenous contrast. Heavily T2-weighted images of the biliary and pancreatic ducts were obtained, and three-dimensional MRCP images were rendered by post processing. CONTRAST:  10mL GADAVIST GADOBUTROL 1 MMOL/ML IV SOLN COMPARISON:  CT  AP 03/17/2023 and right upper quadrant ultrasound from 06/25/2023 FINDINGS: Lower chest: No acute findings. Hepatobiliary: Diffuse hepatic steatosis. No focal enhancing liver lesion. Status post cholecystectomy. Mild common bile duct dilatation measures up to 1 cm, previously 8 mm. Mild intrahepatic bile duct dilatation. No choledocholithiasis. Diffuse periportal edema is noted. Pancreas: No main duct dilatation or mass identified. Diffuse pancreatic edema with peripancreatic fat stranding and fluid. Fluid and soft tissue stranding extends into the small bowel mesentery and bilateral retroperitoneum. No signs of pancreatic necrosis. No pseudocyst identified at this time. Spleen:  Within normal limits in size and appearance. Adrenals/Urinary Tract: Normal adrenal glands. No obstructive uropathy. Small cyst within the anterior cortex of the upper pole of right kidney measures 5 mm, image 18/5. No follow-up imaging recommended. No suspicious enhancing kidney lesions identified. Stomach/Bowel: Stomach appears normal. No pathologic dilatation of the large or small bowel loops. Moderate retained stool identified within the ascending colon. Vascular/Lymphatic: No pathologically enlarged lymph nodes identified. No abdominal aortic aneurysm demonstrated. The upper abdominal vascularity appears patent. Other: No discrete fluid collections identified at this time. Perihepatic ascites. Musculoskeletal: No suspicious bone lesions identified. IMPRESSION: 1. Diffuse pancreatic edema with peripancreatic fat stranding and fluid. Fluid and soft tissue stranding extends into the small bowel mesentery and bilateral retroperitoneum. Findings are compatible with acute pancreatitis.  No signs of pancreatic necrosis. No pseudocyst identified at this time. 2. Status post cholecystectomy. Mild common bile duct dilatation measures up to 1 cm, previously 8 mm. Mild intrahepatic bile duct dilatation. No choledocholithiasis. 3. Diffuse hepatic  steatosis. 4. Moderate retained stool identified within the ascending colon. Electronically Signed   By: Kimberley Penman M.D.   On: 06/25/2023 06:45   US  Abdomen Limited Result Date: 06/25/2023 CLINICAL DATA:  Status post cholecystectomy 12 days ago presenting with epigastric pain and jaundice. EXAM: ULTRASOUND ABDOMEN LIMITED RIGHT UPPER QUADRANT COMPARISON:  May 12, 2023 FINDINGS: Gallbladder: Gallbladder is surgically absent. Common bile duct: Diameter: 12.4 mm Liver: No focal lesion identified. Intrahepatic biliary dilatation is noted. Diffusely increased echogenicity of the liver parenchyma is seen. Portal vein is patent on color Doppler imaging with normal direction of blood flow towards the liver. Other: None. IMPRESSION: 1. Findings consistent with history of prior cholecystectomy. 2. Hepatic steatosis. Electronically Signed   By: Virgle Grime M.D.   On: 06/25/2023 01:29   US  BREAST ASPIRATION RIGHT Result Date: 06/23/2023 CLINICAL DATA:  68 year old female presenting for ultrasound-guided aspiration of a screen detected mass in the RIGHT breast. EXAM: ULTRASOUND GUIDED RIGHT BREAST CYST ASPIRATION COMPARISON:  Previous exam(s). PROCEDURE: The patient and I discussed the procedure of ultrasound-guided aspiration including benefits and alternatives. We discussed the high likelihood of a successful procedure. We discussed the risks of the procedure including infection, bleeding, tissue injury, and inadequate sampling. Informed written consent was given. The usual time out protocol was performed immediately prior to the procedure. Using sterile technique, 1% lidocaine , under direct ultrasound visualization, needle aspiration of the mass in the right breast 3 o'clock position 6 cm from nipple was performed. Approximately 1 cc of clear yellow fluid was aspirated, with complete resolution of the mass at the completion of the procedure. This confirms the mass is consistent with a benign cyst. Aspirated  fluid had a benign appearance and was therefore discarded. IMPRESSION: Ultrasound-guided aspiration of a benign cyst in the RIGHT breast 3 o'clock position no apparent complications. RECOMMENDATIONS: Screening mammogram in 1 year. Electronically Signed   By: Sande Cromer M.D.   On: 06/23/2023 15:48      Jackie Lewis 06/25/2023  Patient ID: Jackie Lewis, female   DOB: 1956/01/20, 68 y.o.   MRN: [id number]

## 2023-06-25 NOTE — H&P (Signed)
 History and Physical    Jackie Lewis GEX:[id number] DOB: [birth year] DOA: 06/24/2023  PCP: Reginal Capra, MD   Patient coming from: Home   Chief Complaint: RUQ abdominal pain, nausea   HPI: Jackie Lewis is a 68 y.o. female with medical history significant for hypothyroidism and symptomatic cholelithiasis status post cholecystectomy on 06/13/2023, now presenting with upper abdominal pain and nausea.  Patient reports that she had been recovering well since her cholecystectomy until yesterday when she developed pain in the upper abdomen.  She describes a sharp, stabbing pain in the upper abdomen with radiation through to her back.  Pain subsided yesterday but returned overnight, has been constant, and severe.  She has had some nausea but no vomiting.  She denies fever or chills.  ED Course: Upon arrival to the ED, patient is found to be afebrile and saturating well on room air with normal HR and stable BP.  Labs are most notable for normal creatinine, normal WBC, alkaline phosphatase 196, AST 335, ALT 449, and total bilirubin 2.7.  Right upper quadrant ultrasound reveals hepatic steatosis, CBD 12.4 mm, and intrahepatic biliary dilatation.  ED physician sent a secure chat to GI with request for routine consultation and the patient was treated with morphine and Zofran .  Review of Systems:  All other systems reviewed and apart from HPI, are negative.  Past Medical History:  Diagnosis Date   Abnormal thyroid  blood test    Allergic rhinitis    Allergy    Anxiety    Arthritis    Carpal tunnel    Bil   Complication of anesthesia    years ago, she was awake when she was intubate for ear surgery, terrified that this will happen again   Diabetes mellitus without complication (HCC)    GERD (gastroesophageal reflux disease)    Headache(784.0)    Hearing loss    Bil/ worse on left side.   Hypertension    Hypothyroidism    Recurrent UTI    Scleritis    hx   Thyroid  disease     Past Surgical  History:  Procedure Laterality Date   CHOLECYSTECTOMY N/A 06/13/2023   Procedure: LAPAROSCOPIC CHOLECYSTECTOMY;  Surgeon: Oza Blumenthal, MD;  Location: [facility];  Service: General;  Laterality: N/A;  LAPAROSCOPIC CHOLECYSTECTOMY   ESOPHAGOGASTRODUODENOSCOPY  04/2007   EXCISION RADIAL HEAD  2015   right elbow   FRACTURE SURGERY Right    ORIF at elbow   INNER EAR SURGERY     hole in right ear/ surg left ear 2-3 times   INNER EAR SURGERY     numerous including reconstruction of left inner ear   TUBAL LIGATION     UPPER GASTROINTESTINAL ENDOSCOPY      Social History:   reports that she has never smoked. She has never used smokeless tobacco. She reports that she does not drink alcohol and does not use drugs.  Allergies  Allergen Reactions   Epinephrine     REACTION: dental injection got tachycardia   Nitrofurantoin Monohyd Macro Rash   Epinephrine     Unknown rxn.   Pork-Derived Products     DOESN'T EAT PORK OR PORK PRODUCTS    Family History  Problem Relation Age of Onset   Stroke Father    Parkinsonism Father    Uterine cancer Sister 70   Heart disease Sister    Colon cancer Maternal Aunt    Breast cancer Paternal Aunt    Lung cancer Paternal Uncle  heavy smoker   Hemolytic uremic syndrome Daughter        atypical  under rx    Breast cancer Daughter 37       CHEK2+   Other Daughter        CHEK2+   Other Son        traumatic brain injury 2011   Breast cancer Cousin        paternal   Prostate cancer Cousin 41       maternal first cousin (also her husband), CHEK2+   Thyroid  disease Other    Hypertension Other    Hyperlipidemia Other      Prior to Admission medications   Medication Sig Start Date End Date Taking? Authorizing Provider  albuterol  (VENTOLIN  HFA) 108 (90 Base) MCG/ACT inhaler Inhale 1-2 puffs into the lungs every 6 (six) hours as needed for wheezing or shortness of breath. Patient not taking: Reported on 05/09/2023 05/21/19   Panosh, Wanda K, MD   Cholecalciferol (VITAMIN D3) 1000 units CAPS Take 1,000 Units by mouth daily.    [provider]  fluticasone  (FLONASE ) 50 MCG/ACT nasal spray SPRAY 2 SPRAYS INTO EACH NOSTRIL EVERY DAY Patient not taking: No sig reported 09/10/19   Denson Flake, MD  hyoscyamine  (LEVSIN/SL) 0.125 MG SL tablet Place 1 tablet (0.125 mg total) under the tongue every 4 (four) hours as needed for cramping (pain). Up to 1.25 mg daily Patient not taking: Reported on 05/17/2023 05/12/23   Harris, Abigail, PA-C  ipratropium (ATROVENT ) 0.03 % nasal spray Place 2 sprays into both nostrils every 12 (twelve) hours. Patient not taking: Reported on 06/05/2023 08/17/22   Mardene Shake, FNP  levothyroxine  (SYNTHROID ) 88 MCG tablet Take 1 tablet (88 mcg total) by mouth daily before breakfast. 06/06/23   Panosh, Wanda K, MD  omeprazole  (PRILOSEC) 40 MG capsule TAKE 1 CAPSULE (40 MG TOTAL) BY MOUTH IN THE MORNING Patient not taking: Reported on 05/17/2023 03/24/23   Panosh, Joaquim Muir, MD  ondansetron  (ZOFRAN ) 4 MG tablet Take 1 tablet (4 mg total) by mouth every 8 (eight) hours as needed for nausea or vomiting. Patient not taking: Reported on 05/17/2023 05/12/23   Harris, Abigail, PA-C  traMADol  (ULTRAM ) 50 MG tablet Take 1 tablet (50 mg total) by mouth every 6 (six) hours as needed. 06/13/23   Oza Blumenthal, MD  triamcinolone  (KENALOG ) 0.025 % cream APPLY TO AFFECTED AREA TWICE A DAY AS DIRECTED 11/17/22   Rondall Codding, FNP    Physical Exam: Vitals:   06/24/23 2330 06/25/23 0000 06/25/23 0030 06/25/23 0115  BP: (!) 148/83 (!) 155/84 (!) 141/73 (!) 167/79  Pulse: 89 87 89 93  Resp: (!) 26 17 19  (!) 24  Temp:      SpO2: 100% 100% 100% 100%  Weight:      Height:        Constitutional: NAD, calm  Eyes: PERTLA, lids and conjunctivae normal ENMT: Mucous membranes are moist. Posterior pharynx clear of any exudate or lesions.   Neck: supple, no masses  Respiratory:no wheezing, no crackles. No accessory muscle use.   Cardiovascular: S1 & S2 heard, regular rate and rhythm. No extremity edema. No significant JVD. Abdomen: Soft, tender in epigastrium. Bowel sounds active.  Musculoskeletal: no clubbing / cyanosis. No joint deformity upper and lower extremities.   Skin: no significant rashes, lesions, ulcers. Warm, dry, well-perfused. Neurologic: CN 2-12 grossly intact. Moving all extremities. Alert and oriented.  Psychiatric: Pleasant. Cooperative.    Labs and Imaging on  Admission: I have personally reviewed following labs and imaging studies  CBC: Recent Labs  Lab 06/24/23 2315  WBC 9.0  HGB 12.5  HCT 38.8  MCV 83.6  PLT 259   Basic Metabolic Panel: Recent Labs  Lab 06/24/23 2315  NA 135  K 3.7  CL 101  CO2 24  GLUCOSE 141*  BUN 10  CREATININE 0.65  CALCIUM 9.7   GFR: Estimated Creatinine Clearance: 68.1 mL/min (by C-G formula based on SCr of 0.65 mg/dL). Liver Function Tests: Recent Labs  Lab 06/24/23 2315  AST 335*  ALT 449*  ALKPHOS 196*  BILITOT 2.7*  PROT 8.1  ALBUMIN 3.8   Recent Labs  Lab 06/24/23 2315  LIPASE 130*   No results for input(s): "AMMONIA" in the last 168 hours. Coagulation Profile: No results for input(s): "INR", "PROTIME" in the last 168 hours. Cardiac Enzymes: No results for input(s): "CKTOTAL", "CKMB", "CKMBINDEX", "TROPONINI" in the last 168 hours. BNP (last 3 results) No results for input(s): "PROBNP" in the last 8760 hours. HbA1C: No results for input(s): "HGBA1C" in the last 72 hours. CBG: No results for input(s): "GLUCAP" in the last 168 hours. Lipid Profile: No results for input(s): "CHOL", "HDL", "LDLCALC", "TRIG", "CHOLHDL", "LDLDIRECT" in the last 72 hours. Thyroid  Function Tests: No results for input(s): "TSH", "T4TOTAL", "FREET4", "T3FREE", "THYROIDAB" in the last 72 hours. Anemia Panel: No results for input(s): "VITAMINB12", "FOLATE", "FERRITIN", "TIBC", "IRON", "RETICCTPCT" in the last 72 hours. Urine analysis:    Component  Value Date/Time   COLORURINE AMBER (A) 06/24/2023 2315   APPEARANCEUR CLEAR 06/24/2023 2315   LABSPEC 1.019 06/24/2023 2315   PHURINE 6.0 06/24/2023 2315   GLUCOSEU NEGATIVE 06/24/2023 2315   GLUCOSEU NEGATIVE 05/10/2023 0910   HGBUR NEGATIVE 06/24/2023 2315   HGBUR negative 06/18/2009 0803   BILIRUBINUR NEGATIVE 06/24/2023 2315   BILIRUBINUR Negative 03/16/2023 0951   KETONESUR NEGATIVE 06/24/2023 2315   PROTEINUR NEGATIVE 06/24/2023 2315   UROBILINOGEN 1.0 05/10/2023 0910   NITRITE NEGATIVE 06/24/2023 2315   LEUKOCYTESUR NEGATIVE 06/24/2023 2315   Sepsis Labs: @LABRCNTIP (procalcitonin:4,lacticidven:4) )No results found for this or any previous visit (from the past 240 hours).   Radiological Exams on Admission: US  Abdomen Limited Result Date: 06/25/2023 CLINICAL DATA:  Status post cholecystectomy 12 days ago presenting with epigastric pain and jaundice. EXAM: ULTRASOUND ABDOMEN LIMITED RIGHT UPPER QUADRANT COMPARISON:  May 12, 2023 FINDINGS: Gallbladder: Gallbladder is surgically absent. Common bile duct: Diameter: 12.4 mm Liver: No focal lesion identified. Intrahepatic biliary dilatation is noted. Diffusely increased echogenicity of the liver parenchyma is seen. Portal vein is patent on color Doppler imaging with normal direction of blood flow towards the liver. Other: None. IMPRESSION: 1. Findings consistent with history of prior cholecystectomy. 2. Hepatic steatosis. Electronically Signed   By: Virgle Grime M.D.   On: 06/25/2023 01:29   US  BREAST ASPIRATION RIGHT Result Date: 06/23/2023 CLINICAL DATA:  68 year old female presenting for ultrasound-guided aspiration of a screen detected mass in the RIGHT breast. EXAM: ULTRASOUND GUIDED RIGHT BREAST CYST ASPIRATION COMPARISON:  Previous exam(s). PROCEDURE: The patient and I discussed the procedure of ultrasound-guided aspiration including benefits and alternatives. We discussed the high likelihood of a successful procedure. We  discussed the risks of the procedure including infection, bleeding, tissue injury, and inadequate sampling. Informed written consent was given. The usual time out protocol was performed immediately prior to the procedure. Using sterile technique, 1% lidocaine , under direct ultrasound visualization, needle aspiration of the mass in the right breast 3 o'clock position  6 cm from nipple was performed. Approximately 1 cc of clear yellow fluid was aspirated, with complete resolution of the mass at the completion of the procedure. This confirms the mass is consistent with a benign cyst. Aspirated fluid had a benign appearance and was therefore discarded. IMPRESSION: Ultrasound-guided aspiration of a benign cyst in the RIGHT breast 3 o'clock position no apparent complications. RECOMMENDATIONS: Screening mammogram in 1 year. Electronically Signed   By: Sande Cromer M.D.   On: 06/23/2023 15:48    Assessment/Plan   1. Upper abdominal pain; elevated LFTs  - S/p cholecystectomy on 06/13/23, presents with pain and nausea, noted to have elevated alk phos, transaminases, and t bili  - CBD 12.4 mm and intrahepatic biliary dilatation noted on US   - No infectious s/s  - Check MRCP, continue pain-control, trend labs   2. Hypothyroidism  - Synthroid     DVT prophylaxis: SCDs  Code Status: Full   Level of Care: Level of care: Med-Surg Family Communication: Daughter at bedside  Disposition Plan:  Patient is from: home  Anticipated d/c is to: Home  Anticipated d/c date is: 06/28/23  Patient currently: pending pain-control, MRCP, specialist consultation  Consults called: ED physician sent secure chat to surgery and GI with request for routine consultation  Admission status: Inpatient     Walton Guppy, MD [facility]  06/25/2023, 2:25 AM

## 2023-06-25 NOTE — Consult Note (Signed)
 Jackie Lewis 1955-08-28  [id number].    Requesting MD: Alissa April Chief Complaint/Reason for Consult: pain after lap cholecystectomy  HPI:  68 yo female underwent lap cholecystectomy 11 days ago. She initially did really well with minimal pain. 2 days ago she began having epigastric pain that radiated to the back and right shoulder. She has had nausea. The pain has worsened over the last 2 days so she came to the ED.  ROS: ROS  Family History  Problem Relation Age of Onset   Stroke Father    Parkinsonism Father    Uterine cancer Sister 60   Heart disease Sister    Colon cancer Maternal Aunt    Breast cancer Paternal Aunt    Lung cancer Paternal Uncle        heavy smoker   Hemolytic uremic syndrome Daughter        atypical  under rx    Breast cancer Daughter 62       CHEK2+   Other Daughter        CHEK2+   Other Son        traumatic brain injury 2011   Breast cancer Cousin        paternal   Prostate cancer Cousin 82       maternal first cousin (also her husband), CHEK2+   Thyroid  disease Other    Hypertension Other    Hyperlipidemia Other     Past Medical History:  Diagnosis Date   Abnormal thyroid  blood test    Allergic rhinitis    Allergy    Anxiety    Arthritis    Carpal tunnel    Bil   Complication of anesthesia    years ago, she was awake when she was intubate for ear surgery, terrified that this will happen again   Diabetes mellitus without complication (HCC)    GERD (gastroesophageal reflux disease)    Headache(784.0)    Hearing loss    Bil/ worse on left side.   Hypertension    Hypothyroidism    Recurrent UTI    Scleritis    hx   Thyroid  disease     Past Surgical History:  Procedure Laterality Date   CHOLECYSTECTOMY N/A 06/13/2023   Procedure: LAPAROSCOPIC CHOLECYSTECTOMY;  Surgeon: Oza Blumenthal, MD;  Location: [facility];  Service: General;  Laterality: N/A;  LAPAROSCOPIC CHOLECYSTECTOMY   ESOPHAGOGASTRODUODENOSCOPY  04/2007   EXCISION  RADIAL HEAD  2015   right elbow   FRACTURE SURGERY Right    ORIF at elbow   INNER EAR SURGERY     hole in right ear/ surg left ear 2-3 times   INNER EAR SURGERY     numerous including reconstruction of left inner ear   TUBAL LIGATION     UPPER GASTROINTESTINAL ENDOSCOPY      Social History:  reports that she has never smoked. She has never used smokeless tobacco. She reports that she does not drink alcohol and does not use drugs.  Allergies:  Allergies  Allergen Reactions   Epinephrine     REACTION: dental injection got tachycardia   Nitrofurantoin Monohyd Macro Rash   Pork-Derived Products     DOESN'T EAT PORK OR PORK PRODUCTS    Facility-Administered Medications Prior to Admission  Medication Dose Route Frequency Provider Last Rate Last Admin   0.9 %  sodium chloride  infusion  500 mL Intravenous Continuous Pyrtle, Amber Bail, MD       Medications Prior to Admission  Medication  Sig Dispense Refill   Ca Phosphate-Cholecalciferol (EQL CALCIUM GUMMIES PO) Take 2 tablets by mouth daily.     Cholecalciferol (VITAMIN D3) 1000 units CAPS Take 1,000 Units by mouth daily.     levothyroxine  (SYNTHROID ) 88 MCG tablet Take 1 tablet (88 mcg total) by mouth daily before breakfast. 90 tablet 3   omeprazole  (PRILOSEC) 40 MG capsule TAKE 1 CAPSULE (40 MG TOTAL) BY MOUTH IN THE MORNING 90 capsule 1   traMADol  (ULTRAM ) 50 MG tablet Take 1 tablet (50 mg total) by mouth every 6 (six) hours as needed. 20 tablet 0   triamcinolone  (KENALOG ) 0.025 % cream APPLY TO AFFECTED AREA TWICE A DAY AS DIRECTED (Patient taking differently: Apply 1 Application topically daily as needed (for itching).) 80 g 1    Physical Exam: Blood pressure (!) 148/79, pulse 93, temperature 98.4 F (36.9 C), temperature source Oral, resp. rate (!) 21, height 5' 1.5" (1.562 m), weight 91.3 kg, SpO2 98%. Gen: NAD Resp: nonlabored CV: RRR Abd: soft, slight tenderness in epigastrium Neuro: Aox4  Results for orders placed or  performed during the hospital encounter of 06/24/23 (from the past 48 hours)  Lipase, blood     Status: Abnormal   Collection Time: 06/24/23 11:15 PM  Result Value Ref Range   Lipase 130 (H) 11 - 51 U/L    Comment: Performed at [facility], [address], [place], [place] [address]  Comprehensive metabolic panel     Status: Abnormal   Collection Time: 06/24/23 11:15 PM  Result Value Ref Range   Sodium 135 135 - 145 mmol/L   Potassium 3.7 3.5 - 5.1 mmol/L   Chloride 101 98 - 111 mmol/L   CO2 24 22 - 32 mmol/L   Glucose, Bld 141 (H) 70 - 99 mg/dL    Comment: Glucose reference range applies only to samples taken after fasting for at least 8 hours.   BUN 10 8 - 23 mg/dL   Creatinine, Ser 6.04 0.44 - 1.00 mg/dL   Calcium 9.7 8.9 - 54.0 mg/dL   Total Protein 8.1 6.5 - 8.1 g/dL   Albumin 3.8 3.5 - 5.0 g/dL   AST 981 (H) 15 - 41 U/L   ALT 449 (H) 0 - 44 U/L   Alkaline Phosphatase 196 (H) 38 - 126 U/L   Total Bilirubin 2.7 (H) 0.0 - 1.2 mg/dL   GFR, Estimated >19 >14 mL/min    Comment: (NOTE) Calculated using the CKD-EPI Creatinine Equation (2021)    Anion gap 10 5 - 15    Comment: Performed at [facility], [address] [place], [place] [address]  CBC     Status: None   Collection Time: 06/24/23 11:15 PM  Result Value Ref Range   WBC 9.0 4.0 - 10.5 K/uL   RBC 4.64 3.87 - 5.11 MIL/uL   Hemoglobin 12.5 12.0 - 15.0 g/dL   HCT 62.1 30.8 - 65.7 %   MCV 83.6 80.0 - 100.0 fL   MCH 26.9 26.0 - 34.0 pg   MCHC 32.2 30.0 - 36.0 g/dL   RDW 84.6 96.2 - 95.2 %   Platelets 259 150 - 400 K/uL   nRBC 0.0 0.0 - 0.2 %    Comment: Performed at [facility], [address] [place], [place] [address]  Urinalysis, Routine w reflex microscopic -Urine, Clean Catch     Status: Abnormal   Collection Time: 06/24/23 11:15 PM  Result Value Ref Range   Color,  Urine AMBER (A) YELLOW    Comment: BIOCHEMICALS MAY BE AFFECTED BY COLOR   APPearance CLEAR CLEAR   Specific Gravity, Urine  1.019 1.005 - 1.030   pH 6.0 5.0 - 8.0   Glucose, UA NEGATIVE NEGATIVE mg/dL   Hgb urine dipstick NEGATIVE NEGATIVE   Bilirubin Urine NEGATIVE NEGATIVE   Ketones, ur NEGATIVE NEGATIVE mg/dL   Protein, ur NEGATIVE NEGATIVE mg/dL   Nitrite NEGATIVE NEGATIVE   Leukocytes,Ua NEGATIVE NEGATIVE    Comment: Performed at [facility], [address], [place], [place] [address]   US  Abdomen Limited Result Date: 06/25/2023 CLINICAL DATA:  Status post cholecystectomy 12 days ago presenting with epigastric pain and jaundice. EXAM: ULTRASOUND ABDOMEN LIMITED RIGHT UPPER QUADRANT COMPARISON:  May 12, 2023 FINDINGS: Gallbladder: Gallbladder is surgically absent. Common bile duct: Diameter: 12.4 mm Liver: No focal lesion identified. Intrahepatic biliary dilatation is noted. Diffusely increased echogenicity of the liver parenchyma is seen. Portal vein is patent on color Doppler imaging with normal direction of blood flow towards the liver. Other: None. IMPRESSION: 1. Findings consistent with history of prior cholecystectomy. 2. Hepatic steatosis. Electronically Signed   By: Virgle Grime M.D.   On: 06/25/2023 01:29   US  BREAST ASPIRATION RIGHT Result Date: 06/23/2023 CLINICAL DATA:  68 year old female presenting for ultrasound-guided aspiration of a screen detected mass in the RIGHT breast. EXAM: ULTRASOUND GUIDED RIGHT BREAST CYST ASPIRATION COMPARISON:  Previous exam(s). PROCEDURE: The patient and I discussed the procedure of ultrasound-guided aspiration including benefits and alternatives. We discussed the high likelihood of a successful procedure. We discussed the risks of the procedure including infection, bleeding, tissue injury, and inadequate sampling. Informed written consent was given. The usual time out protocol was performed immediately prior to the procedure. Using sterile technique, 1% lidocaine , under direct ultrasound visualization, needle aspiration of the mass in the right breast 3 o'clock  position 6 cm from nipple was performed. Approximately 1 cc of clear yellow fluid was aspirated, with complete resolution of the mass at the completion of the procedure. This confirms the mass is consistent with a benign cyst. Aspirated fluid had a benign appearance and was therefore discarded. IMPRESSION: Ultrasound-guided aspiration of a benign cyst in the RIGHT breast 3 o'clock position no apparent complications. RECOMMENDATIONS: Screening mammogram in 1 year. Electronically Signed   By: Sande Cromer M.D.   On: 06/23/2023 15:48    Assessment/Plan 68 yo female s/p lap chole with AST/ALT elevation, lipase elevation and T bili of 2.7. On my review of US , CBD is dilated to 12 mm from 5.8 mm previously and there is no fluid collection in the gallbladder fossa. This is concerning for biliary obstruction/retained stone -MRI to further delineate anatomy and cause  FEN - NPO VTE - lovenox ID - no issues, will need coverage if found to have blockage on MRI Admit - admitted to med surg by hospitalist service  I reviewed last 24 h vitals and pain scores, last 48 h intake and output, last 24 h labs and trends, and last 24 h imaging results.  Alphonso Aschoff [facility] 06/25/2023, 4:37 AM Please see Amion for pager number during day hours 7:00am-4:30pm or 7:00am -11:30am on weekends

## 2023-06-26 DIAGNOSIS — K838 Other specified diseases of biliary tract: Secondary | ICD-10-CM | POA: Diagnosis not present

## 2023-06-26 DIAGNOSIS — K859 Acute pancreatitis without necrosis or infection, unspecified: Secondary | ICD-10-CM

## 2023-06-26 DIAGNOSIS — R7989 Other specified abnormal findings of blood chemistry: Secondary | ICD-10-CM

## 2023-06-26 DIAGNOSIS — K831 Obstruction of bile duct: Secondary | ICD-10-CM | POA: Diagnosis not present

## 2023-06-26 LAB — CBC
HCT: 36.9 % (ref 36.0–46.0)
Hemoglobin: 11.9 g/dL — ABNORMAL LOW (ref 12.0–15.0)
MCH: 27.3 pg (ref 26.0–34.0)
MCHC: 32.2 g/dL (ref 30.0–36.0)
MCV: 84.6 fL (ref 80.0–100.0)
Platelets: 197 10*3/uL (ref 150–400)
RBC: 4.36 MIL/uL (ref 3.87–5.11)
RDW: 15.1 % (ref 11.5–15.5)
WBC: 10.2 10*3/uL (ref 4.0–10.5)
nRBC: 0 % (ref 0.0–0.2)

## 2023-06-26 LAB — COMPREHENSIVE METABOLIC PANEL WITH GFR
ALT: 315 U/L — ABNORMAL HIGH (ref 0–44)
AST: 160 U/L — ABNORMAL HIGH (ref 15–41)
Albumin: 3 g/dL — ABNORMAL LOW (ref 3.5–5.0)
Alkaline Phosphatase: 162 U/L — ABNORMAL HIGH (ref 38–126)
Anion gap: 10 (ref 5–15)
BUN: 6 mg/dL — ABNORMAL LOW (ref 8–23)
CO2: 24 mmol/L (ref 22–32)
Calcium: 8.7 mg/dL — ABNORMAL LOW (ref 8.9–10.3)
Chloride: 104 mmol/L (ref 98–111)
Creatinine, Ser: 0.66 mg/dL (ref 0.44–1.00)
GFR, Estimated: >60 mL/min (ref >60)
Glucose, Bld: 93 mg/dL (ref 70–99)
Potassium: 3.9 mmol/L (ref 3.5–5.1)
Sodium: 138 mmol/L (ref 135–145)
Total Bilirubin: 1.6 mg/dL — ABNORMAL HIGH (ref 0.0–1.2)
Total Protein: 6.1 g/dL — ABNORMAL LOW (ref 6.5–8.1)

## 2023-06-26 LAB — LIPASE, BLOOD: Lipase: 450 U/L — ABNORMAL HIGH (ref 11–51)

## 2023-06-26 NOTE — Discharge Instructions (Signed)
 Follow with Panosh, Joaquim Muir, MD in 5-7 days  Please get a complete blood count and chemistry panel checked by your Primary MD at your next visit, and again as instructed by your Primary MD. Please get your medications reviewed and adjusted by your Primary MD.  Please request your Primary MD to go over all Hospital Tests and Procedure/Radiological results at the follow up, please get all Hospital records sent to your Prim MD by signing hospital release before you go home.  In some cases, there will be blood work, cultures and biopsy results pending at the time of your discharge. Please request that your primary care M.D. goes through all the records of your hospital data and follows up on these results.  If you had Pneumonia of Lung problems at the Hospital: Please get a 2 view Chest X ray done in 6-8 weeks after hospital discharge or sooner if instructed by your Primary MD.  If you have Congestive Heart Failure: Please call your Cardiologist or Primary MD anytime you have any of the following symptoms:  1) 3 pound weight gain in 24 hours or 5 pounds in 1 week  2) shortness of breath, with or without a dry hacking cough  3) swelling in the hands, feet or stomach  4) if you have to sleep on extra pillows at night in order to breathe  Follow cardiac low salt diet and 1.5 lit/day fluid restriction.  If you have diabetes Accuchecks 4 times/day, Once in AM empty stomach and then before each meal. Log in all results and show them to your primary doctor at your next visit. If any glucose reading is under 80 or above 300 call your primary MD immediately.  If you have Seizure/Convulsions/Epilepsy: Please do not drive, operate heavy machinery, participate in activities at heights or participate in high speed sports until you have seen by Primary MD or a Neurologist and advised to do so again. Per [place]  DMV statutes, patients with seizures are not allowed to drive until they have been  seizure-free for six months.  Use caution when using heavy equipment or power tools. Avoid working on ladders or at heights. Take showers instead of baths. Ensure the water temperature is not too high on the home water heater. Do not go swimming alone. Do not lock yourself in a room alone (i.e. bathroom). When caring for infants or small children, sit down when holding, feeding, or changing them to minimize risk of injury to the child in the event you have a seizure. Maintain good sleep hygiene. Avoid alcohol.   If you had Gastrointestinal Bleeding: Please ask your Primary MD to check a complete blood count within one week of discharge or at your next visit. Your endoscopic/colonoscopic biopsies that are pending at the time of discharge, will also need to followed by your Primary MD.  Get Medicines reviewed and adjusted. Please take all your medications with you for your next visit with your Primary MD  Please request your Primary MD to go over all hospital tests and procedure/radiological results at the follow up, please ask your Primary MD to get all Hospital records sent to his/her office.  If you experience worsening of your admission symptoms, develop shortness of breath, life threatening emergency, suicidal or homicidal thoughts you must seek medical attention immediately by calling 911 or calling your MD immediately  if symptoms less severe.  You must read complete instructions/literature along with all the possible adverse reactions/side effects for all the Medicines you  take and that have been prescribed to you. Take any new Medicines after you have completely understood and accpet all the possible adverse reactions/side effects.   Do not drive or operate heavy machinery when taking Pain medications.   Do not take more than prescribed Pain, Sleep and Anxiety Medications  Special Instructions: If you have smoked or chewed Tobacco  in the last 2 yrs please stop smoking, stop any regular  Alcohol  and or any Recreational drug use.  Wear Seat belts while driving.  Please note You were cared for by a hospitalist during your hospital stay. If you have any questions about your discharge medications or the care you received while you were in the hospital after you are discharged, you can call the unit and asked to speak with the hospitalist on call if the hospitalist that took care of you is not available. Once you are discharged, your primary care physician will handle any further medical issues. Please note that NO REFILLS for any discharge medications will be authorized once you are discharged, as it is imperative that you return to your primary care physician (or establish a relationship with a primary care physician if you do not have one) for your aftercare needs so that they can reassess your need for medications and monitor your lab values.  You can reach the hospitalist office at phone [phone] or fax [phone]   If you do not have a primary care physician, you can call [phone] for a physician referral.  Activity: As tolerated with Full fall precautions use walker/cane & assistance as needed    Diet: low fat  Disposition Home

## 2023-06-26 NOTE — Progress Notes (Signed)
 Daily Progress Note  DOA: 06/24/2023 Hospital Day: 3   Chief Complaint: Elevated LFTs / pancreatitis  ASSESSMENT    68 y.o. year old female with a medical history not limited to hypothyroidism and cholecystectomy s/p CCY on 4/29. Admitted 5/11 with abdominal pain , elevated LFTs and acute pancreatitis. See 5/11 GI consult note  Elevated LFTs / acute pancreatitis / mild biliary duct dilation without choledocholithiasis . Suspected biliary pancreatitis 2/2 to a passed stone.  Today:  LFTs continue to trend down. No nausea / no abdominal pain    PLAN   --Tolerating full liquids --Possibly home later today   Subjective   No abdominal pain . No nausea. Has a little pain "all over my body"  Objective    Recent Labs    06/24/23 2315 06/25/23 0932 06/26/23 0500  WBC 9.0 8.4 10.2  HGB 12.5 12.8 11.9*  HCT 38.8 39.0 36.9  MCV 83.6 82.3 84.6  PLT 259 229 197    Recent Labs    06/24/23 2315 06/25/23 0932 06/26/23 0500  NA 135 137 138  K 3.7 3.8 3.9  CL 101 102 104  CO2 24 24 24   GLUCOSE 141* 139* 93  BUN 10 10 6*  CREATININE 0.65 0.71 0.66  CALCIUM 9.7 9.2 8.7*   Recent Labs    06/24/23 2315 06/25/23 0932 06/26/23 0500  PROT 8.1 7.2 6.1*  ALBUMIN 3.8 3.5 3.0*  AST 335* 328* 160*  ALT 449* 458* 315*  ALKPHOS 196* 179* 162*  BILITOT 2.7* 3.6* 1.6*    Imaging:  MR 3D Recon At Scanner CLINICAL DATA:  Jaundice.  EXAM: MRI ABDOMEN WITHOUT AND WITH CONTRAST (INCLUDING MRCP)  TECHNIQUE: Multiplanar multisequence MR imaging of the abdomen was performed both before and after the administration of intravenous contrast. Heavily T2-weighted images of the biliary and pancreatic ducts were obtained, and three-dimensional MRCP images were rendered by post processing.  CONTRAST:  10mL GADAVIST GADOBUTROL 1 MMOL/ML IV SOLN  COMPARISON:  CT AP 03/17/2023 and right upper quadrant ultrasound from 06/25/2023  FINDINGS: Lower chest: No acute  findings.  Hepatobiliary: Diffuse hepatic steatosis. No focal enhancing liver lesion.  Status post cholecystectomy. Mild common bile duct dilatation measures up to 1 cm, previously 8 mm. Mild intrahepatic bile duct dilatation. No choledocholithiasis. Diffuse periportal edema is noted.  Pancreas: No main duct dilatation or mass identified. Diffuse pancreatic edema with peripancreatic fat stranding and fluid. Fluid and soft tissue stranding extends into the small bowel mesentery and bilateral retroperitoneum. No signs of pancreatic necrosis. No pseudocyst identified at this time.  Spleen:  Within normal limits in size and appearance.  Adrenals/Urinary Tract: Normal adrenal glands. No obstructive uropathy. Small cyst within the anterior cortex of the upper pole of right kidney measures 5 mm, image 18/5. No follow-up imaging recommended. No suspicious enhancing kidney lesions identified.  Stomach/Bowel: Stomach appears normal. No pathologic dilatation of the large or small bowel loops. Moderate retained stool identified within the ascending colon.  Vascular/Lymphatic: No pathologically enlarged lymph nodes identified. No abdominal aortic aneurysm demonstrated. The upper abdominal vascularity appears patent.  Other: No discrete fluid collections identified at this time. Perihepatic ascites.  Musculoskeletal: No suspicious bone lesions identified.  IMPRESSION: 1. Diffuse pancreatic edema with peripancreatic fat stranding and fluid. Fluid and soft tissue stranding extends into the small bowel mesentery and bilateral retroperitoneum. Findings are compatible with acute pancreatitis. No signs of pancreatic necrosis. No pseudocyst identified at this time. 2. Status post cholecystectomy. Mild common  bile duct dilatation measures up to 1 cm, previously 8 mm. Mild intrahepatic bile duct dilatation. No choledocholithiasis. 3. Diffuse hepatic steatosis. 4. Moderate retained stool  identified within the ascending colon.  Electronically Signed   By: Kimberley Penman M.D.   On: 06/25/2023 06:45 MR ABDOMEN MRCP W WO CONTAST CLINICAL DATA:  Jaundice.  EXAM: MRI ABDOMEN WITHOUT AND WITH CONTRAST (INCLUDING MRCP)  TECHNIQUE: Multiplanar multisequence MR imaging of the abdomen was performed both before and after the administration of intravenous contrast. Heavily T2-weighted images of the biliary and pancreatic ducts were obtained, and three-dimensional MRCP images were rendered by post processing.  CONTRAST:  10mL GADAVIST GADOBUTROL 1 MMOL/ML IV SOLN  COMPARISON:  CT AP 03/17/2023 and right upper quadrant ultrasound from 06/25/2023  FINDINGS: Lower chest: No acute findings.  Hepatobiliary: Diffuse hepatic steatosis. No focal enhancing liver lesion.  Status post cholecystectomy. Mild common bile duct dilatation measures up to 1 cm, previously 8 mm. Mild intrahepatic bile duct dilatation. No choledocholithiasis. Diffuse periportal edema is noted.  Pancreas: No main duct dilatation or mass identified. Diffuse pancreatic edema with peripancreatic fat stranding and fluid. Fluid and soft tissue stranding extends into the small bowel mesentery and bilateral retroperitoneum. No signs of pancreatic necrosis. No pseudocyst identified at this time.  Spleen:  Within normal limits in size and appearance.  Adrenals/Urinary Tract: Normal adrenal glands. No obstructive uropathy. Small cyst within the anterior cortex of the upper pole of right kidney measures 5 mm, image 18/5. No follow-up imaging recommended. No suspicious enhancing kidney lesions identified.  Stomach/Bowel: Stomach appears normal. No pathologic dilatation of the large or small bowel loops. Moderate retained stool identified within the ascending colon.  Vascular/Lymphatic: No pathologically enlarged lymph nodes identified. No abdominal aortic aneurysm demonstrated. The upper abdominal vascularity  appears patent.  Other: No discrete fluid collections identified at this time. Perihepatic ascites.  Musculoskeletal: No suspicious bone lesions identified.  IMPRESSION: 1. Diffuse pancreatic edema with peripancreatic fat stranding and fluid. Fluid and soft tissue stranding extends into the small bowel mesentery and bilateral retroperitoneum. Findings are compatible with acute pancreatitis. No signs of pancreatic necrosis. No pseudocyst identified at this time. 2. Status post cholecystectomy. Mild common bile duct dilatation measures up to 1 cm, previously 8 mm. Mild intrahepatic bile duct dilatation. No choledocholithiasis. 3. Diffuse hepatic steatosis. 4. Moderate retained stool identified within the ascending colon.  Electronically Signed   By: Kimberley Penman M.D.   On: 06/25/2023 06:45 US  Abdomen Limited CLINICAL DATA:  Status post cholecystectomy 12 days ago presenting with epigastric pain and jaundice.  EXAM: ULTRASOUND ABDOMEN LIMITED RIGHT UPPER QUADRANT  COMPARISON:  May 12, 2023  FINDINGS: Gallbladder:  Gallbladder is surgically absent.  Common bile duct:  Diameter: 12.4 mm  Liver:  No focal lesion identified. Intrahepatic biliary dilatation is noted. Diffusely increased echogenicity of the liver parenchyma is seen. Portal vein is patent on color Doppler imaging with normal direction of blood flow towards the liver.  Other: None.  IMPRESSION: 1. Findings consistent with history of prior cholecystectomy. 2. Hepatic steatosis.  Electronically Signed   By: Virgle Grime M.D.   On: 06/25/2023 01:29     Scheduled inpatient medications:   levothyroxine   88 mcg Oral QAC breakfast   Continuous inpatient infusions:  PRN inpatient medications: fentaNYL  (SUBLIMAZE ) injection, ibuprofen , ondansetron  **OR** ondansetron  (ZOFRAN ) IV  Vital signs in last 24 hours: Temp:  [98.3 F (36.8 C)-98.5 F (36.9 C)] 98.3 F (36.8 C) (05/12 0811) Pulse Rate:   [  77-84] 83 (05/12 0811) Resp:  [16-18] 18 (05/12 0811) BP: (106-124)/(47-67) 124/67 (05/12 0811) SpO2:  [95 %-98 %] 95 % (05/12 0811) Weight:  [93.2 kg] 93.2 kg (05/12 0614) Last BM Date : 06/23/23  Intake/Output Summary (Last 24 hours) at 06/26/2023 1027 Last data filed at 06/26/2023 0600 Gross per 24 hour  Intake 2751.58 ml  Output 1200 ml  Net 1551.58 ml    Intake/Output from previous day: 05/11 0701 - 05/12 0700 In: 2751.6 [P.O.:680; I.V.:2071.6] Out: 1200 [Urine:1200] Intake/Output this shift: No intake/output data recorded.   Physical Exam:  General: Alert female in NAD Heart:  Regular rate and rhythm.  Pulmonary: Normal respiratory effort Abdomen: Soft, nondistended, nontender. Normal bowel sounds. Extremities: No lower extremity edema  Neurologic: Alert and oriented Psych: Pleasant. Cooperative     LOS: 1 day   Mai Schwalbe ,NP 06/26/2023, 10:27 AM

## 2023-06-26 NOTE — Progress Notes (Signed)
 Subjective/Chief Complaint: Pt reports mild body aches but denies abdominal pain. Tolerated clear liquids   Objective: Vital signs in last 24 hours: Temp:  [98.2 F (36.8 C)-98.5 F (36.9 C)] 98.5 F (36.9 C) (05/12 0452) Pulse Rate:  [77-85] 79 (05/12 0452) Resp:  [16-19] 18 (05/12 0452) BP: (106-113)/(47-71) 107/47 (05/12 0452) SpO2:  [94 %-98 %] 96 % (05/12 0452) Weight:  [93.2 kg] 93.2 kg (05/12 0614) Last BM Date : 06/23/23  Intake/Output from previous day: 05/11 0701 - 05/12 0700 In: 2751.6 [P.O.:680; I.V.:2071.6] Out: 1200 [Urine:1200] Intake/Output this shift: Total I/O In: 1874.1 [P.O.:240; I.V.:1634.1] Out: 600 [Urine:600]  Exam: Awake and alert Looks comfortable Abdomen soft, obese, non-tender this morning  Lab Results:  Recent Labs    06/25/23 0932 06/26/23 0500  WBC 8.4 10.2  HGB 12.8 11.9*  HCT 39.0 36.9  PLT 229 197   BMET Recent Labs    06/25/23 0932 06/26/23 0500  NA 137 138  K 3.8 3.9  CL 102 104  CO2 24 24  GLUCOSE 139* 93  BUN 10 6*  CREATININE 0.71 0.66  CALCIUM 9.2 8.7*   PT/INR No results for input(s): "LABPROT", "INR" in the last 72 hours. ABG No results for input(s): "PHART", "HCO3" in the last 72 hours.  Invalid input(s): "PCO2", "PO2"  Studies/Results: MR ABDOMEN MRCP W WO CONTAST Result Date: 06/25/2023 CLINICAL DATA:  Jaundice. EXAM: MRI ABDOMEN WITHOUT AND WITH CONTRAST (INCLUDING MRCP) TECHNIQUE: Multiplanar multisequence MR imaging of the abdomen was performed both before and after the administration of intravenous contrast. Heavily T2-weighted images of the biliary and pancreatic ducts were obtained, and three-dimensional MRCP images were rendered by post processing. CONTRAST:  10mL GADAVIST GADOBUTROL 1 MMOL/ML IV SOLN COMPARISON:  CT AP 03/17/2023 and right upper quadrant ultrasound from 06/25/2023 FINDINGS: Lower chest: No acute findings. Hepatobiliary: Diffuse hepatic steatosis. No focal enhancing liver  lesion. Status post cholecystectomy. Mild common bile duct dilatation measures up to 1 cm, previously 8 mm. Mild intrahepatic bile duct dilatation. No choledocholithiasis. Diffuse periportal edema is noted. Pancreas: No main duct dilatation or mass identified. Diffuse pancreatic edema with peripancreatic fat stranding and fluid. Fluid and soft tissue stranding extends into the small bowel mesentery and bilateral retroperitoneum. No signs of pancreatic necrosis. No pseudocyst identified at this time. Spleen:  Within normal limits in size and appearance. Adrenals/Urinary Tract: Normal adrenal glands. No obstructive uropathy. Small cyst within the anterior cortex of the upper pole of right kidney measures 5 mm, image 18/5. No follow-up imaging recommended. No suspicious enhancing kidney lesions identified. Stomach/Bowel: Stomach appears normal. No pathologic dilatation of the large or small bowel loops. Moderate retained stool identified within the ascending colon. Vascular/Lymphatic: No pathologically enlarged lymph nodes identified. No abdominal aortic aneurysm demonstrated. The upper abdominal vascularity appears patent. Other: No discrete fluid collections identified at this time. Perihepatic ascites. Musculoskeletal: No suspicious bone lesions identified. IMPRESSION: 1. Diffuse pancreatic edema with peripancreatic fat stranding and fluid. Fluid and soft tissue stranding extends into the small bowel mesentery and bilateral retroperitoneum. Findings are compatible with acute pancreatitis. No signs of pancreatic necrosis. No pseudocyst identified at this time. 2. Status post cholecystectomy. Mild common bile duct dilatation measures up to 1 cm, previously 8 mm. Mild intrahepatic bile duct dilatation. No choledocholithiasis. 3. Diffuse hepatic steatosis. 4. Moderate retained stool identified within the ascending colon. Electronically Signed   By: Kimberley Penman M.D.   On: 06/25/2023 06:45   MR 3D Recon At  Scanner Result Date:  06/25/2023 CLINICAL DATA:  Jaundice. EXAM: MRI ABDOMEN WITHOUT AND WITH CONTRAST (INCLUDING MRCP) TECHNIQUE: Multiplanar multisequence MR imaging of the abdomen was performed both before and after the administration of intravenous contrast. Heavily T2-weighted images of the biliary and pancreatic ducts were obtained, and three-dimensional MRCP images were rendered by post processing. CONTRAST:  10mL GADAVIST GADOBUTROL 1 MMOL/ML IV SOLN COMPARISON:  CT AP 03/17/2023 and right upper quadrant ultrasound from 06/25/2023 FINDINGS: Lower chest: No acute findings. Hepatobiliary: Diffuse hepatic steatosis. No focal enhancing liver lesion. Status post cholecystectomy. Mild common bile duct dilatation measures up to 1 cm, previously 8 mm. Mild intrahepatic bile duct dilatation. No choledocholithiasis. Diffuse periportal edema is noted. Pancreas: No main duct dilatation or mass identified. Diffuse pancreatic edema with peripancreatic fat stranding and fluid. Fluid and soft tissue stranding extends into the small bowel mesentery and bilateral retroperitoneum. No signs of pancreatic necrosis. No pseudocyst identified at this time. Spleen:  Within normal limits in size and appearance. Adrenals/Urinary Tract: Normal adrenal glands. No obstructive uropathy. Small cyst within the anterior cortex of the upper pole of right kidney measures 5 mm, image 18/5. No follow-up imaging recommended. No suspicious enhancing kidney lesions identified. Stomach/Bowel: Stomach appears normal. No pathologic dilatation of the large or small bowel loops. Moderate retained stool identified within the ascending colon. Vascular/Lymphatic: No pathologically enlarged lymph nodes identified. No abdominal aortic aneurysm demonstrated. The upper abdominal vascularity appears patent. Other: No discrete fluid collections identified at this time. Perihepatic ascites. Musculoskeletal: No suspicious bone lesions identified. IMPRESSION: 1.  Diffuse pancreatic edema with peripancreatic fat stranding and fluid. Fluid and soft tissue stranding extends into the small bowel mesentery and bilateral retroperitoneum. Findings are compatible with acute pancreatitis. No signs of pancreatic necrosis. No pseudocyst identified at this time. 2. Status post cholecystectomy. Mild common bile duct dilatation measures up to 1 cm, previously 8 mm. Mild intrahepatic bile duct dilatation. No choledocholithiasis. 3. Diffuse hepatic steatosis. 4. Moderate retained stool identified within the ascending colon. Electronically Signed   By: Kimberley Penman M.D.   On: 06/25/2023 06:45   US  Abdomen Limited Result Date: 06/25/2023 CLINICAL DATA:  Status post cholecystectomy 12 days ago presenting with epigastric pain and jaundice. EXAM: ULTRASOUND ABDOMEN LIMITED RIGHT UPPER QUADRANT COMPARISON:  May 12, 2023 FINDINGS: Gallbladder: Gallbladder is surgically absent. Common bile duct: Diameter: 12.4 mm Liver: No focal lesion identified. Intrahepatic biliary dilatation is noted. Diffusely increased echogenicity of the liver parenchyma is seen. Portal vein is patent on color Doppler imaging with normal direction of blood flow towards the liver. Other: None. IMPRESSION: 1. Findings consistent with history of prior cholecystectomy. 2. Hepatic steatosis. Electronically Signed   By: Virgle Grime M.D.   On: 06/25/2023 01:29    Anti-infectives: Anti-infectives (From admission, onward)    None       Assessment/Plan: S/p lap chole on 4/29. Admitted with elevated LFT"s, pancreatitis on MRCP but no retained CBD stone seen  -clinically she is improving.  LFT"s are back down this morning.  WBC is normal.  Suspect she likely passed a small stone. -will advance to full liquids.  Could consider discharge later today if she tolerates fulls and would recommend a full liquid diet for the next day or so before resuming a regular diet   Oza Blumenthal 06/26/2023

## 2023-06-26 NOTE — Discharge Summary (Signed)
 Physician Discharge Summary  Jackie Lewis AVW:[id number] DOB: 1955-06-30 DOA: 06/24/2023  PCP: Reginal Capra, MD  Admit date: 06/24/2023 Discharge date: 06/26/2023  Admitted From: home Disposition:  home  Recommendations for Outpatient Follow-up:  Follow up with PCP in 1-2 weeks  Home Health: none Equipment/Devices: none  Discharge Condition: stable CODE STATUS: Full code  Summary: 68 year old female with history of hypothyroidism, symptomatic cholelithiasis status postcholecystectomy 4/29 comes in upper abdominal pain, nausea. She recovered well, the day prior to admission a little while after she had food has experienced upper abdomen pain, wrapping around her back, constant, severe. She has some nausea but no vomiting. She was found to have elevated LFTs, CBD and intrahepatic duct dilation, GI and surgery were consulted and she was admitted to the hospital   Hospital Course / Discharge diagnoses: Principal Problem:   Biliary obstruction Active Problems:   Hypothyroidism   Acute biliary pancreatitis without infection or necrosis   Elevated lipase   Elevated bilirubin   Elevated liver function tests   Principal problem Acute pancreatitis -patient with recent cholecystectomy comes into the hospital with recurrent abdominal pain and elevated LFTs.  CT scan as well as MRI/MRCP showed pancreatic inflammation, no choledocholithiasis.  It is possible that she has passed a stone.  General surgery as well as gastroenterology were consulted and followed patient while hospitalized.  Her symptoms resolved shortly after admission, and LFTs are starting to improve.  Her diet was advanced and she is tolerating without recurrent abdominal pain.  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  She would benefit from weight loss Elevated LFTs-improving  Sepsis ruled  out   Discharge Instructions   Allergies as of 06/26/2023       Reactions   Epinephrine    REACTION: dental injection got tachycardia   Nitrofurantoin Monohyd Macro Rash   Pork-derived Products    DOESN'T EAT PORK OR PORK PRODUCTS        Medication List     TAKE these medications    EQL CALCIUM GUMMIES PO Take 2 tablets by mouth daily.   levothyroxine  88 MCG tablet Commonly known as: SYNTHROID  Take 1 tablet (88 mcg total) by mouth daily before breakfast.   omeprazole  40 MG capsule Commonly known as: PRILOSEC TAKE 1 CAPSULE (40 MG TOTAL) BY MOUTH IN THE MORNING   traMADol  50 MG tablet Commonly known as: Ultram  Take 1 tablet (50 mg total) by mouth every 6 (six) hours as needed.   triamcinolone  0.025 % cream Commonly known as: KENALOG  APPLY TO AFFECTED AREA TWICE A DAY AS DIRECTED What changed:  how much to take how to take this when to take this reasons to take this additional instructions   Vitamin D3 25 MCG (1000 UT) Caps Take 1,000 Units by mouth daily.       Consultations: GI Surgery   Procedures/Studies:  MR ABDOMEN MRCP W WO CONTAST Result Date: 06/25/2023 CLINICAL DATA:  Jaundice. EXAM: MRI ABDOMEN WITHOUT AND WITH CONTRAST (INCLUDING MRCP) TECHNIQUE: Multiplanar multisequence MR imaging of the abdomen was performed both before and after the administration of intravenous contrast. Heavily T2-weighted images of the biliary and pancreatic ducts were obtained, and three-dimensional MRCP images were rendered by post processing. CONTRAST:  10mL GADAVIST GADOBUTROL 1 MMOL/ML IV SOLN COMPARISON:  CT AP 03/17/2023 and right upper quadrant ultrasound from 06/25/2023 FINDINGS: Lower chest: No acute findings. Hepatobiliary: Diffuse hepatic steatosis. No  focal enhancing liver lesion. Status post cholecystectomy. Mild common bile duct dilatation measures up to 1 cm, previously 8 mm. Mild intrahepatic bile duct dilatation. No choledocholithiasis. Diffuse periportal  edema is noted. Pancreas: No main duct dilatation or mass identified. Diffuse pancreatic edema with peripancreatic fat stranding and fluid. Fluid and soft tissue stranding extends into the small bowel mesentery and bilateral retroperitoneum. No signs of pancreatic necrosis. No pseudocyst identified at this time. Spleen:  Within normal limits in size and appearance. Adrenals/Urinary Tract: Normal adrenal glands. No obstructive uropathy. Small cyst within the anterior cortex of the upper pole of right kidney measures 5 mm, image 18/5. No follow-up imaging recommended. No suspicious enhancing kidney lesions identified. Stomach/Bowel: Stomach appears normal. No pathologic dilatation of the large or small bowel loops. Moderate retained stool identified within the ascending colon. Vascular/Lymphatic: No pathologically enlarged lymph nodes identified. No abdominal aortic aneurysm demonstrated. The upper abdominal vascularity appears patent. Other: No discrete fluid collections identified at this time. Perihepatic ascites. Musculoskeletal: No suspicious bone lesions identified. IMPRESSION: 1. Diffuse pancreatic edema with peripancreatic fat stranding and fluid. Fluid and soft tissue stranding extends into the small bowel mesentery and bilateral retroperitoneum. Findings are compatible with acute pancreatitis. No signs of pancreatic necrosis. No pseudocyst identified at this time. 2. Status post cholecystectomy. Mild common bile duct dilatation measures up to 1 cm, previously 8 mm. Mild intrahepatic bile duct dilatation. No choledocholithiasis. 3. Diffuse hepatic steatosis. 4. Moderate retained stool identified within the ascending colon. Electronically Signed   By: Kimberley Penman M.D.   On: 06/25/2023 06:45   MR 3D Recon At Scanner Result Date: 06/25/2023 CLINICAL DATA:  Jaundice. EXAM: MRI ABDOMEN WITHOUT AND WITH CONTRAST (INCLUDING MRCP) TECHNIQUE: Multiplanar multisequence MR imaging of the abdomen was performed both  before and after the administration of intravenous contrast. Heavily T2-weighted images of the biliary and pancreatic ducts were obtained, and three-dimensional MRCP images were rendered by post processing. CONTRAST:  10mL GADAVIST GADOBUTROL 1 MMOL/ML IV SOLN COMPARISON:  CT AP 03/17/2023 and right upper quadrant ultrasound from 06/25/2023 FINDINGS: Lower chest: No acute findings. Hepatobiliary: Diffuse hepatic steatosis. No focal enhancing liver lesion. Status post cholecystectomy. Mild common bile duct dilatation measures up to 1 cm, previously 8 mm. Mild intrahepatic bile duct dilatation. No choledocholithiasis. Diffuse periportal edema is noted. Pancreas: No main duct dilatation or mass identified. Diffuse pancreatic edema with peripancreatic fat stranding and fluid. Fluid and soft tissue stranding extends into the small bowel mesentery and bilateral retroperitoneum. No signs of pancreatic necrosis. No pseudocyst identified at this time. Spleen:  Within normal limits in size and appearance. Adrenals/Urinary Tract: Normal adrenal glands. No obstructive uropathy. Small cyst within the anterior cortex of the upper pole of right kidney measures 5 mm, image 18/5. No follow-up imaging recommended. No suspicious enhancing kidney lesions identified. Stomach/Bowel: Stomach appears normal. No pathologic dilatation of the large or small bowel loops. Moderate retained stool identified within the ascending colon. Vascular/Lymphatic: No pathologically enlarged lymph nodes identified. No abdominal aortic aneurysm demonstrated. The upper abdominal vascularity appears patent. Other: No discrete fluid collections identified at this time. Perihepatic ascites. Musculoskeletal: No suspicious bone lesions identified. IMPRESSION: 1. Diffuse pancreatic edema with peripancreatic fat stranding and fluid. Fluid and soft tissue stranding extends into the small bowel mesentery and bilateral retroperitoneum. Findings are compatible with  acute pancreatitis. No signs of pancreatic necrosis. No pseudocyst identified at this time. 2. Status post cholecystectomy. Mild common bile duct dilatation measures up to 1 cm, previously 8  mm. Mild intrahepatic bile duct dilatation. No choledocholithiasis. 3. Diffuse hepatic steatosis. 4. Moderate retained stool identified within the ascending colon. Electronically Signed   By: Kimberley Penman M.D.   On: 06/25/2023 06:45   US  Abdomen Limited Result Date: 06/25/2023 CLINICAL DATA:  Status post cholecystectomy 12 days ago presenting with epigastric pain and jaundice. EXAM: ULTRASOUND ABDOMEN LIMITED RIGHT UPPER QUADRANT COMPARISON:  May 12, 2023 FINDINGS: Gallbladder: Gallbladder is surgically absent. Common bile duct: Diameter: 12.4 mm Liver: No focal lesion identified. Intrahepatic biliary dilatation is noted. Diffusely increased echogenicity of the liver parenchyma is seen. Portal vein is patent on color Doppler imaging with normal direction of blood flow towards the liver. Other: None. IMPRESSION: 1. Findings consistent with history of prior cholecystectomy. 2. Hepatic steatosis. Electronically Signed   By: Virgle Grime M.D.   On: 06/25/2023 01:29   US  BREAST ASPIRATION RIGHT Result Date: 06/23/2023 CLINICAL DATA:  68 year old female presenting for ultrasound-guided aspiration of a screen detected mass in the RIGHT breast. EXAM: ULTRASOUND GUIDED RIGHT BREAST CYST ASPIRATION COMPARISON:  Previous exam(s). PROCEDURE: The patient and I discussed the procedure of ultrasound-guided aspiration including benefits and alternatives. We discussed the high likelihood of a successful procedure. We discussed the risks of the procedure including infection, bleeding, tissue injury, and inadequate sampling. Informed written consent was given. The usual time out protocol was performed immediately prior to the procedure. Using sterile technique, 1% lidocaine , under direct ultrasound visualization, needle aspiration of  the mass in the right breast 3 o'clock position 6 cm from nipple was performed. Approximately 1 cc of clear yellow fluid was aspirated, with complete resolution of the mass at the completion of the procedure. This confirms the mass is consistent with a benign cyst. Aspirated fluid had a benign appearance and was therefore discarded. IMPRESSION: Ultrasound-guided aspiration of a benign cyst in the RIGHT breast 3 o'clock position no apparent complications. RECOMMENDATIONS: Screening mammogram in 1 year. Electronically Signed   By: Sande Cromer M.D.   On: 06/23/2023 15:48   US  LIMITED ULTRASOUND INCLUDING AXILLA RIGHT BREAST Result Date: 06/20/2023 CLINICAL DATA:  68 year old female presents for further evaluation of RIGHT breast mass identified on screening mammogram. EXAM: ULTRASOUND OF THE RIGHT BREAST COMPARISON:  Previous exam(s). FINDINGS: Targeted ultrasound is performed, showing a 0.9 x 1.1 x 0.9 cm partially circumscribed partially indistinct/slightly irregular near anechoic mass at the 3 o'clock position of the RIGHT breast 6 cm from the nipple. This mass contains a peripheral calcification and corresponds to the mammographic mass. No abnormal RIGHT axillary lymph nodes are identified. IMPRESSION: 1. Indeterminate 1.1 cm INNER RIGHT breast mass corresponding to the mammographic mass. Recommend aspiration/possible biopsy, which will be scheduled. No abnormal appearing RIGHT axillary lymph nodes. RECOMMENDATION: Ultrasound-guided RIGHT breast aspiration/possible biopsy, which will be scheduled. I have discussed the findings and recommendations with the patient. If applicable, a reminder letter will be sent to the patient regarding the next appointment. BI-RADS CATEGORY  4: Suspicious. Electronically Signed   By: Sundra Engel M.D.   On: 06/20/2023 08:09     Subjective: - no chest pain, shortness of breath, no abdominal pain, nausea or vomiting.   Discharge Exam: BP 124/67   Pulse 83   Temp 98.3 F  (36.8 C) (Oral)   Resp 18   Ht 5' 1.5" (1.562 m)   Wt 93.2 kg   SpO2 95%   BMI 38.19 kg/m   General: Pt is alert, awake, not in acute distress Cardiovascular: RRR, S1/S2 +, no  rubs, no gallops Respiratory: CTA bilaterally, no wheezing, no rhonchi Abdominal: Soft, NT, ND, bowel sounds + Extremities: no edema, no cyanosis    The results of significant diagnostics from this hospitalization (including imaging, microbiology, ancillary and laboratory) are listed below for reference.     Microbiology: No results found for this or any previous visit (from the past 240 hours).   Labs: Basic Metabolic Panel: Recent Labs  Lab 06/24/23 2315 06/25/23 0932 06/26/23 0500  NA 135 137 138  K 3.7 3.8 3.9  CL 101 102 104  CO2 24 24 24   GLUCOSE 141* 139* 93  BUN 10 10 6*  CREATININE 0.65 0.71 0.66  CALCIUM 9.7 9.2 8.7*  MG  --  1.9  --    Liver Function Tests: Recent Labs  Lab 06/24/23 2315 06/25/23 0932 06/26/23 0500  AST 335* 328* 160*  ALT 449* 458* 315*  ALKPHOS 196* 179* 162*  BILITOT 2.7* 3.6* 1.6*  PROT 8.1 7.2 6.1*  ALBUMIN 3.8 3.5 3.0*   CBC: Recent Labs  Lab 06/24/23 2315 06/25/23 0932 06/26/23 0500  WBC 9.0 8.4 10.2  HGB 12.5 12.8 11.9*  HCT 38.8 39.0 36.9  MCV 83.6 82.3 84.6  PLT 259 229 197   CBG: No results for input(s): "GLUCAP" in the last 168 hours. Hgb A1c No results for input(s): "HGBA1C" in the last 72 hours. Lipid Profile No results for input(s): "CHOL", "HDL", "LDLCALC", "TRIG", "CHOLHDL", "LDLDIRECT" in the last 72 hours. Thyroid  function studies No results for input(s): "TSH", "T4TOTAL", "T3FREE", "THYROIDAB" in the last 72 hours.  Invalid input(s): "FREET3" Urinalysis    Component Value Date/Time   COLORURINE AMBER (A) 06/24/2023 2315   APPEARANCEUR CLEAR 06/24/2023 2315   LABSPEC 1.019 06/24/2023 2315   PHURINE 6.0 06/24/2023 2315   GLUCOSEU NEGATIVE 06/24/2023 2315   GLUCOSEU NEGATIVE 05/10/2023 0910   HGBUR NEGATIVE  06/24/2023 2315   HGBUR negative 06/18/2009 0803   BILIRUBINUR NEGATIVE 06/24/2023 2315   BILIRUBINUR Negative 03/16/2023 0951   KETONESUR NEGATIVE 06/24/2023 2315   PROTEINUR NEGATIVE 06/24/2023 2315   UROBILINOGEN 1.0 05/10/2023 0910   NITRITE NEGATIVE 06/24/2023 2315   LEUKOCYTESUR NEGATIVE 06/24/2023 2315    FURTHER DISCHARGE INSTRUCTIONS:   Get Medicines reviewed and adjusted: Please take all your medications with you for your next visit with your Primary MD   Laboratory/radiological data: Please request your Primary MD to go over all hospital tests and procedure/radiological results at the follow up, please ask your Primary MD to get all Hospital records sent to his/her office.   In some cases, they will be blood work, cultures and biopsy results pending at the time of your discharge. Please request that your primary care M.D. goes through all the records of your hospital data and follows up on these results.   Also Note the following: If you experience worsening of your admission symptoms, develop shortness of breath, life threatening emergency, suicidal or homicidal thoughts you must seek medical attention immediately by calling 911 or calling your MD immediately  if symptoms less severe.   You must read complete instructions/literature along with all the possible adverse reactions/side effects for all the Medicines you take and that have been prescribed to you. Take any new Medicines after you have completely understood and accpet all the possible adverse reactions/side effects.    Do not drive when taking Pain medications or sleeping medications (Benzodaizepines)   Do not take more than prescribed Pain, Sleep and Anxiety Medications. It is not advisable to  combine anxiety,sleep and pain medications without talking with your primary care practitioner   Special Instructions: If you have smoked or chewed Tobacco  in the last 2 yrs please stop smoking, stop any regular Alcohol   and or any Recreational drug use.   Wear Seat belts while driving.   Please note: You were cared for by a hospitalist during your hospital stay. Once you are discharged, your primary care physician will handle any further medical issues. Please note that NO REFILLS for any discharge medications will be authorized once you are discharged, as it is imperative that you return to your primary care physician (or establish a relationship with a primary care physician if you do not have one) for your post hospital discharge needs so that they can reassess your need for medications and monitor your lab values.  Time coordinating discharge: 35 minutes  SIGNED:  Kathlen Para, MD, PhD 06/26/2023, 2:51 PM

## 2023-06-26 NOTE — TOC Transition Note (Signed)
 Transition of Care [facility]) - Discharge Note   Patient Details  Name: Jackie Lewis MRN: [id number] Date of Birth: 1955/10/03  Transition of Care [facility]) CM/SW Contact:  Tom-Johnson, Angelique Ken, RN Phone Number: 06/26/2023, 3:03 PM   Clinical Narrative:     Patient is scheduled for discharge today.  Readmission Risk Assessment done. Hospital f/u and discharge instructions on AVS. No TOC needs or recommendations noted. Daughter, Hatice to transport at discharge.  No further TOC needs noted.       Final next level of care: Home/Self Care Barriers to Discharge: Barriers Resolved   Patient Goals and CMS Choice Patient states their goals for this hospitalization and ongoing recovery are:: To return home CMS Medicare.gov Compare Post Acute Care list provided to:: Patient Choice offered to / list presented to : NA      Discharge Placement                Patient to be transferred to facility by: Daughter Name of family member notified: Hatice    Discharge Plan and Services Additional resources added to the After Visit Summary for                  DME Arranged: N/A DME Agency: NA       HH Arranged: NA HH Agency: NA        Social Drivers of Health (SDOH) Interventions SDOH Screenings   Food Insecurity: No Food Insecurity (06/25/2023)  Housing: Low Risk  (06/25/2023)  Transportation Needs: No Transportation Needs (06/25/2023)  Utilities: Not At Risk (06/25/2023)  Alcohol Screen: Low Risk  (06/01/2022)  Depression (PHQ2-9): Low Risk  (06/06/2023)  Financial Resource Strain: Low Risk  (06/01/2022)  Physical Activity: Inactive (12/26/2022)  Social Connections: Moderately Isolated (06/25/2023)  Stress: No Stress Concern Present (12/26/2022)  Tobacco Use: Low Risk  (06/25/2023)     Readmission Risk Interventions    06/26/2023    3:02 PM  Readmission Risk Prevention Plan  Post Dischage Appt Complete  Medication Screening Complete  Transportation Screening  Complete

## 2023-06-27 ENCOUNTER — Telehealth: Payer: Self-pay

## 2023-06-27 NOTE — Transitions of Care (Post Inpatient/ED Visit) (Unsigned)
   06/27/2023  Name: Jackie Lewis MRN: [id number] DOB: [birth year]  Today's TOC FU Call Status: Today's TOC FU Call Status:: Unsuccessful Call (1st Attempt) Unsuccessful Call (1st Attempt) Date: 06/27/23  Attempted to reach the patient regarding the most recent Inpatient/ED visit.  Follow Up Plan: Additional outreach attempts will be made to reach the patient to complete the Transitions of Care (Post Inpatient/ED visit) call.   Signature Darrall Ellison, LPN [facility] Nurse Health Advisor Direct Dial [phone]

## 2023-06-27 NOTE — Progress Notes (Unsigned)
 No chief complaint on file.   HPI: [age] y.o. come in for " fu hosp"  Had choley 4 29 Presented to ed 5 10 with post op pain and   Had  breast bx for abn mammo  and breast aspiration 5 9 beneing findings  ROS: See pertinent positives and negatives per HPI.  Past Medical History:  Diagnosis Date   Abnormal thyroid  blood test    Allergic rhinitis    Allergy    Anxiety    Arthritis    Carpal tunnel    Bil   Complication of anesthesia    years ago, she was awake when she was intubate for ear surgery, terrified that this will happen again   Diabetes mellitus without complication (HCC)    GERD (gastroesophageal reflux disease)    Headache(784.0)    Hearing loss    Bil/ worse on left side.   Hypertension    Hypothyroidism    Recurrent UTI    Scleritis    hx   Thyroid  disease     Family History  Problem Relation Age of Onset   Stroke Father    Parkinsonism Father    Uterine cancer Sister 23   Heart disease Sister    Colon cancer Maternal Aunt    Breast cancer Paternal Aunt    Lung cancer Paternal Uncle        heavy smoker   Hemolytic uremic syndrome Daughter        atypical  under rx    Breast cancer Daughter 85       CHEK2+   Other Daughter        CHEK2+   Other Son        traumatic brain injury 2011   Breast cancer Cousin        paternal   Prostate cancer Cousin 67       maternal first cousin (also her husband), CHEK2+   Thyroid  disease Other    Hypertension Other    Hyperlipidemia Other     Social History   Socioeconomic History   Marital status: Married    Spouse name: Not on file   Number of children: Not on file   Years of education: Not on file   Highest education level: Not on file  Occupational History   Not on file  Tobacco Use   Smoking status: Never   Smokeless tobacco: Never  Vaping Use   Vaping status: Never Used  Substance and Sexual Activity   Alcohol use: Never   Drug use: Never   Sexual activity: Not Currently   Other Topics Concern   Not on file  Social History Narrative   ** Merged History Encounter **       Married hh of 3- Regular exercise- no 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 Daughter graduated from [employer]  grad school in [place] [employer]   Got hus living in [place] Daughter   In [place]   Social Drivers of Health   Financial Resource Strain: Low Risk  (06/01/2022)   Overall Financial Resource Strain (CARDIA)    Difficulty of Paying Living  Expenses: Not very hard  Food Insecurity: No Food Insecurity (06/25/2023)   Hunger Vital Sign    Worried About Running Out of Food in the Last Year: Never true    Ran Out of Food in the Last Year: Never true  Transportation Needs: No Transportation Needs (06/25/2023)   PRAPARE - Administrator, Civil Service (Medical): No    Lack of Transportation (Non-Medical): No  Physical Activity: Inactive (12/26/2022)   Exercise Vital Sign    Days of Exercise per Week: 0 days    Minutes of Exercise per Session: 0 min  Stress: No Stress Concern Present (12/26/2022)   [employer] of Occupational Health - Occupational Stress Questionnaire    Feeling of Stress : Only a little  Social Connections: Moderately Isolated (06/25/2023)   Social Connection and Isolation Panel [NHANES]    Frequency of Communication with Friends and Family: More than three times a week    Frequency of Social Gatherings with Friends and Family: Twice a week    Attends Religious Services: Never    Database administrator or Organizations: No    Attends Banker Meetings: Never    Marital Status: Married    Outpatient Medications Prior to Visit  Medication Sig Dispense Refill   Ca  Phosphate-Cholecalciferol (EQL CALCIUM GUMMIES PO) Take 2 tablets by mouth daily.     Cholecalciferol (VITAMIN D3) 1000 units CAPS Take 1,000 Units by mouth daily.     levothyroxine  (SYNTHROID ) 88 MCG tablet Take 1 tablet (88 mcg total) by mouth daily before breakfast. 90 tablet 3   omeprazole  (PRILOSEC) 40 MG capsule TAKE 1 CAPSULE (40 MG TOTAL) BY MOUTH IN THE MORNING 90 capsule 1   traMADol  (ULTRAM ) 50 MG tablet Take 1 tablet (50 mg total) by mouth every 6 (six) hours as needed. 20 tablet 0   triamcinolone  (KENALOG ) 0.025 % cream APPLY TO AFFECTED AREA TWICE A DAY AS DIRECTED (Patient taking differently: Apply 1 Application topically daily as needed (for itching).) 80 g 1   No facility-administered medications prior to visit.     EXAM:  There were no vitals taken for this visit.  There is no height or weight on file to calculate BMI.  GENERAL: vitals reviewed and listed above, alert, oriented, appears well hydrated and in no acute distress HEENT: atraumatic, conjunctiva  clear, no obvious abnormalities on inspection of external nose and ears OP : no lesion edema or exudate  NECK: no obvious masses on inspection palpation  LUNGS: clear to auscultation bilaterally, no wheezes, rales or rhonchi, good air movement CV: HRRR, no clubbing cyanosis or  peripheral edema nl cap refill  MS: moves all extremities without noticeable focal  abnormality PSYCH: pleasant and cooperative, no obvious depression or anxiety Lab Results  Component Value Date   WBC 10.2 06/26/2023   HGB 11.9 (L) 06/26/2023   HCT 36.9 06/26/2023   PLT 197 06/26/2023   GLUCOSE 93 06/26/2023   CHOL 187 05/30/2023   TRIG 118.0 05/30/2023   HDL 63.30 05/30/2023   LDLDIRECT 113.9 06/22/2010   LDLCALC 100 (H) 05/30/2023   ALT 315 (H) 06/26/2023   AST 160 (H) 06/26/2023   NA 138 06/26/2023   K 3.9 06/26/2023   CL 104 06/26/2023   CREATININE 0.66 06/26/2023   BUN 6 (L) 06/26/2023   CO2 24 06/26/2023   TSH 3.71  05/30/2023   HGBA1C 5.8 05/30/2023   MICROALBUR 1.0 03/16/2023   BP Readings from Last 3 Encounters:  06/26/23 124/67  06/13/23 (!) 141/74  06/09/23 (!) 152/81   .Lipase     Component Value Date/Time   LIPASE 450 (H) 06/26/2023 0500     IMPRESSION: 1. Diffuse pancreatic edema with peripancreatic fat stranding and fluid. Fluid and soft tissue stranding extends into the small bowel mesentery and bilateral retroperitoneum. Findings are compatible with acute pancreatitis. No signs of pancreatic necrosis. No pseudocyst identified at this time. 2. Status post cholecystectomy. Mild common bile duct dilatation measures up to 1 cm, previously 8 mm. Mild intrahepatic bile duct dilatation. No choledocholithiasis. 3. Diffuse hepatic steatosis. 4. Moderate retained stool identified within the ascending colon.     Electronically Signed   By: Kimberley Penman M.D.   On: 06/25/2023 06:45 ASSESSMENT AND PLAN:  Discussed the following assessment and plan:  No diagnosis found.  -Patient advised to return or notify health care team  if  new concerns arise.  There are no Patient Instructions on file for this visit.   Tracie Dore K. Annalia Metzger M.D.  Admit date: 06/24/2023 Discharge date: 06/26/2023   Admitted From: home Disposition:  home   Recommendations for Outpatient Follow-up:  Follow up with PCP in 1-2 weeks   Home Health: none Equipment/Devices: none   Discharge Condition: stable CODE STATUS: Full code   Summary: 68 year old female with history of hypothyroidism, symptomatic cholelithiasis status postcholecystectomy 4/29 comes in upper abdominal pain, nausea. She recovered well, the day prior to admission a little while after she had food has experienced upper abdomen pain, wrapping around her back, constant, severe. She has some nausea but no vomiting. She was found to have elevated LFTs, CBD and intrahepatic duct dilation, GI and surgery were consulted and she was admitted to the  hospital    Hospital Course / Discharge diagnoses: Principal Problem:   Biliary obstruction Active Problems:   Hypothyroidism   Acute biliary pancreatitis without infection or necrosis   Elevated lipase   Elevated bilirubin   Elevated liver function tests     Principal problem Acute pancreatitis -patient with recent cholecystectomy comes into the hospital with recurrent abdominal pain and elevated LFTs.  CT scan as well as MRI/MRCP showed pancreatic inflammation, no choledocholithiasis.  It is possible that she has passed a stone.  General surgery as well as gastroenterology were consulted and followed patient while hospitalized.  Her symptoms resolved shortly after admission, and LFTs are starting to improve.  Her diet was advanced and she is tolerating without recurrent abdominal pain.  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  She would benefit from weight loss Elevated LFTs-improving   Sepsis ruled out     Discharge Instructions

## 2023-06-28 ENCOUNTER — Encounter: Payer: Self-pay | Admitting: Internal Medicine

## 2023-06-28 ENCOUNTER — Other Ambulatory Visit: Payer: Self-pay | Admitting: Internal Medicine

## 2023-06-28 ENCOUNTER — Ambulatory Visit: Admitting: Internal Medicine

## 2023-06-28 VITALS — BP 116/76 | HR 80 | Temp 98.1°F | Ht 61.5 in | Wt 201.0 lb

## 2023-06-28 DIAGNOSIS — K851 Biliary acute pancreatitis without necrosis or infection: Secondary | ICD-10-CM | POA: Diagnosis not present

## 2023-06-28 DIAGNOSIS — R7401 Elevation of levels of liver transaminase levels: Secondary | ICD-10-CM

## 2023-06-28 DIAGNOSIS — K760 Fatty (change of) liver, not elsewhere classified: Secondary | ICD-10-CM

## 2023-06-28 DIAGNOSIS — Z09 Encounter for follow-up examination after completed treatment for conditions other than malignant neoplasm: Secondary | ICD-10-CM

## 2023-06-28 NOTE — Patient Instructions (Signed)
 Good to see you today . Stay on lower fat diet .  For now   Plan  updated labs in next 1-2 weeks before surgery FU   I will also do a referral  to the [facility]  Gi team for referral .

## 2023-06-28 NOTE — Transitions of Care (Post Inpatient/ED Visit) (Signed)
   06/28/2023  Name: Jackie Lewis MRN: [id number] DOB: 26-Dec-1955  Today's TOC FU Call Status: Today's TOC FU Call Status:: Successful TOC FU Call Completed Unsuccessful Call (1st Attempt) Date: 06/27/23 [facility] FU Call Complete Date: 06/28/23 Patient's Name and Date of Birth confirmed.  Transition Care Management Follow-up Telephone Call Date of Discharge: 06/26/23 Discharge Facility: [facility]) Type of Discharge: Inpatient Admission Primary Inpatient Discharge Diagnosis:: right breast lump How have you been since you were released from the hospital?: Same  Items Reviewed: Did you receive and understand the discharge instructions provided?: Yes Medications obtained,verified, and reconciled?: Yes (Medications Reviewed) Any new allergies since your discharge?: No Dietary orders reviewed?: Yes Do you have support at home?: Yes People in Home [RPT]: spouse  Medications Reviewed Today: Medications Reviewed Today     Reviewed by Darrall Ellison, LPN (Licensed Practical Nurse) on 06/28/23 at 1056  Med List Status: <None>   Medication Order Taking? Sig Documenting Provider Last Dose Status Informant  Ca Phosphate-Cholecalciferol (EQL CALCIUM GUMMIES PO) [id number] No Take 2 tablets by mouth daily. [provider] 06/23/2023 Active Self  Cholecalciferol (VITAMIN D3) 1000 units CAPS [id number] No Take 1,000 Units by mouth daily. [provider] 06/23/2023 Active Self  levothyroxine  (SYNTHROID ) 88 MCG tablet [id number] No Take 1 tablet (88 mcg total) by mouth daily before breakfast. Panosh, Wanda K, MD 06/24/2023 Morning Active Self  omeprazole  (PRILOSEC) 40 MG capsule [id number] No TAKE 1 CAPSULE (40 MG TOTAL) BY MOUTH IN THE MORNING Panosh, Wanda K, MD 06/24/2023 Evening Active Self  traMADol  (ULTRAM ) 50 MG tablet [id number] No Take 1 tablet (50 mg total) by mouth every 6 (six) hours as needed. Oza Blumenthal, MD Past Week Active Self  triamcinolone  (KENALOG ) 0.025 % cream  [id number] No APPLY TO AFFECTED AREA TWICE A DAY AS DIRECTED  Patient taking differently: Apply 1 Application topically daily as needed (for itching).   Rondall Codding, FNP Past Week Active Self            Home Care and Equipment/Supplies: Were Home Health Services Ordered?: NA Any new equipment or medical supplies ordered?: NA  Functional Questionnaire: Do you need assistance with bathing/showering or dressing?: No Do you need assistance with meal preparation?: No Do you need assistance with eating?: No Do you have difficulty maintaining continence: No Do you need assistance with getting out of bed/getting out of a chair/moving?: No Do you have difficulty managing or taking your medications?: No  Follow up appointments reviewed: PCP Follow-up appointment confirmed?: Yes Date of PCP follow-up appointment?: 06/28/23 Follow-up Provider: [facility] Follow-up appointment confirmed?: NA Do you need transportation to your follow-up appointment?: No Do you understand care options if your condition(s) worsen?: Yes-patient verbalized understanding    SIGNATURE Darrall Ellison, LPN [facility] Nurse Health Advisor Direct Dial [phone]

## 2023-06-28 NOTE — Progress Notes (Signed)
 Future labs tbd

## 2023-07-11 ENCOUNTER — Other Ambulatory Visit (INDEPENDENT_AMBULATORY_CARE_PROVIDER_SITE_OTHER)

## 2023-07-11 ENCOUNTER — Ambulatory Visit: Payer: Self-pay | Admitting: Internal Medicine

## 2023-07-11 DIAGNOSIS — K760 Fatty (change of) liver, not elsewhere classified: Secondary | ICD-10-CM

## 2023-07-11 DIAGNOSIS — K851 Biliary acute pancreatitis without necrosis or infection: Secondary | ICD-10-CM

## 2023-07-11 DIAGNOSIS — R7401 Elevation of levels of liver transaminase levels: Secondary | ICD-10-CM | POA: Diagnosis not present

## 2023-07-11 LAB — HEPATIC FUNCTION PANEL
ALT: 27 U/L (ref 0–35)
AST: 23 U/L (ref 0–37)
Albumin: 4.3 g/dL (ref 3.5–5.2)
Alkaline Phosphatase: 79 U/L (ref 39–117)
Bilirubin, Direct: 0.2 mg/dL (ref 0.0–0.3)
Total Bilirubin: 0.6 mg/dL (ref 0.2–1.2)
Total Protein: 7.3 g/dL (ref 6.0–8.3)

## 2023-07-11 LAB — CBC WITH DIFFERENTIAL/PLATELET
Basophils Absolute: 0 10*3/uL (ref 0.0–0.1)
Basophils Relative: 0.4 % (ref 0.0–3.0)
Eosinophils Absolute: 0.3 10*3/uL (ref 0.0–0.7)
Eosinophils Relative: 5.8 % — ABNORMAL HIGH (ref 0.0–5.0)
HCT: 37.7 % (ref 36.0–46.0)
Hemoglobin: 12.3 g/dL (ref 12.0–15.0)
Lymphocytes Relative: 34.8 % (ref 12.0–46.0)
Lymphs Abs: 1.7 10*3/uL (ref 0.7–4.0)
MCHC: 32.6 g/dL (ref 30.0–36.0)
MCV: 82.5 fl (ref 78.0–100.0)
Monocytes Absolute: 0.3 10*3/uL (ref 0.1–1.0)
Monocytes Relative: 6.4 % (ref 3.0–12.0)
Neutro Abs: 2.6 10*3/uL (ref 1.4–7.7)
Neutrophils Relative %: 52.6 % (ref 43.0–77.0)
Platelets: 221 10*3/uL (ref 150.0–400.0)
RBC: 4.57 Mil/uL (ref 3.87–5.11)
RDW: 15.6 % — ABNORMAL HIGH (ref 11.5–15.5)
WBC: 5 10*3/uL (ref 4.0–10.5)

## 2023-07-11 LAB — BASIC METABOLIC PANEL WITH GFR
BUN: 17 mg/dL (ref 6–23)
CO2: 24 meq/L (ref 19–32)
Calcium: 9.8 mg/dL (ref 8.4–10.5)
Chloride: 102 meq/L (ref 96–112)
Creatinine, Ser: 0.61 mg/dL (ref 0.40–1.20)
GFR: 91.34 mL/min (ref >60.00)
Glucose, Bld: 99 mg/dL (ref 70–99)
Potassium: 4.1 meq/L (ref 3.5–5.1)
Sodium: 138 meq/L (ref 135–145)

## 2023-07-11 NOTE — Progress Notes (Signed)
 FU labs  liver panel now normal , anemia  resolved .  Rdw is  recovering from anemia and not of concern . Renal function and calcium now nl . No need for further lab  fu.

## 2023-07-18 ENCOUNTER — Telehealth: Payer: Self-pay | Admitting: Internal Medicine

## 2023-07-18 NOTE — Telephone Encounter (Signed)
 Patient would like referral for [facility]. Please advise patient at [phone]

## 2023-07-19 NOTE — Telephone Encounter (Signed)
 Attempted to reach pt. Left a voicemail to call us back.

## 2023-07-27 NOTE — Telephone Encounter (Signed)
 Spoke to pt. 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 And wanted to follow up visit and to send referral to [facility].   Pt states she has not seen her eye doctor for 4 years.  Pt updates they are able to take her in but won't able to get her in until a month later. Pt states they told her with a referral she will be get in quicker.   Schedule appt with Dr. Alyne Babinski on Monday but inform pt I will follow up with her after confirmation. Pt verbalized understanding.   Are you okay to see pt on Monday the referral?  Please advise.

## 2023-07-28 NOTE — Telephone Encounter (Signed)
 Contacted Jackie Lewis. Inform her of provider's message. Jackie Lewis states she is okay to wait for Dr. Ethel Henry. Appt is made for 7/1 at 3pm. No further action is needed.

## 2023-07-28 NOTE — Telephone Encounter (Signed)
 Unless this is an emergency situation, this should wait for Dr. Ethel Henry

## 2023-07-28 NOTE — Telephone Encounter (Signed)
 Attempted to reach pt. Left a voicemail to call us back.

## 2023-07-31 ENCOUNTER — Ambulatory Visit: Admitting: Family Medicine

## 2023-08-15 ENCOUNTER — Ambulatory Visit (INDEPENDENT_AMBULATORY_CARE_PROVIDER_SITE_OTHER): Admitting: Internal Medicine

## 2023-08-15 ENCOUNTER — Encounter: Payer: Self-pay | Admitting: Internal Medicine

## 2023-08-15 VITALS — BP 130/80 | HR 89 | Temp 98.0°F | Ht 61.5 in | Wt 203.6 lb

## 2023-08-15 DIAGNOSIS — H269 Unspecified cataract: Secondary | ICD-10-CM

## 2023-08-15 DIAGNOSIS — Z8669 Personal history of other diseases of the nervous system and sense organs: Secondary | ICD-10-CM

## 2023-08-15 DIAGNOSIS — H538 Other visual disturbances: Secondary | ICD-10-CM

## 2023-08-15 DIAGNOSIS — R6889 Other general symptoms and signs: Secondary | ICD-10-CM | POA: Diagnosis not present

## 2023-08-15 NOTE — Progress Notes (Signed)
 Chief Complaint  Patient presents with   Need referral to [facility].     Pt reports she was seen with [facility] 4 yrs ago and dx with cataract. Haven't seen with them. She states they required a referral in order to schedule an appt.     HPI: Jackie Lewis 68 y.o. 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  Hx in remote past of vision   wavy? A couple times a month   shimmering  hard to focus .   Was told  this could be a Migraine  years ago.   Since then off still gets  sx  .  Still continues with vision changes episodes  She does have   on going light sensitivity also .  Wears   glasses ,  see ok at night but avoids driving .   Sensitivity  to light.   ROS: See pertinent positives and negatives per HPI.  Gi sx  better    on prn return basis   to GI   Past Medical History:  Diagnosis Date   Abnormal thyroid  blood test    Allergic rhinitis    Allergy    Anxiety    Arthritis    Carpal tunnel    Bil   Complication of anesthesia    years ago, she was awake when she was intubate for ear surgery, terrified that this will happen again   Diabetes mellitus without complication (HCC)    GERD (gastroesophageal reflux disease)    Headache(784.0)    Hearing loss    Bil/ worse on left side.   Hypertension    Hypothyroidism    Recurrent UTI    Scleritis    hx   Thyroid  disease     Family History  Problem Relation Age of Onset   Stroke Father    Parkinsonism Father    Uterine cancer Sister 52   Heart disease Sister    Colon cancer Maternal Aunt    Breast cancer Paternal Aunt    Lung cancer Paternal Uncle        heavy smoker   Hemolytic uremic syndrome Daughter        atypical  under rx    Breast cancer Daughter 32       CHEK2+   Other Daughter        CHEK2+   Other Son        traumatic brain injury 2011    Breast cancer Cousin        paternal   Prostate cancer Cousin 26       maternal first cousin (also her husband), CHEK2+   Thyroid  disease Other    Hypertension Other    Hyperlipidemia Other     Social History   Socioeconomic History   Marital status: Married    Spouse name: Not on file   Number of children: Not on file   Years of education: Not on file   Highest education level: Not on file  Occupational History   Not on file  Tobacco Use   Smoking status: Never   Smokeless tobacco: Never  Vaping Use   Vaping status: Never Used  Substance and Sexual Activity   Alcohol use: Never   Drug use: Never  Sexual activity: Not Currently  Other Topics Concern   Not on file  Social History Narrative   ** Merged History Encounter **       Married hh of 3- Regular exercise- no 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 Daughter graduated from [employer]  grad school in [place] [employer]   Got hus living in [place] Daughter   In [place]   Social Drivers of Health   Financial Resource Strain: Low Risk  (06/01/2022)   Overall Financial Resource Strain (CARDIA)    Difficulty of Paying Living Expenses: Not very hard  Food Insecurity: No Food Insecurity (06/25/2023)   Hunger Vital Sign    Worried About Running Out of Food in the Last Year: Never true    Ran Out of Food in the Last Year: Never true  Transportation Needs: No Transportation Needs (06/25/2023)   PRAPARE - Administrator, Civil Service (Medical): No    Lack of Transportation (Non-Medical): No  Physical Activity: Inactive (12/26/2022)   Exercise Vital Sign    Days of Exercise per Week: 0 days    Minutes of Exercise per Session: 0 min  Stress: No Stress Concern  Present (12/26/2022)   [employer] of Occupational Health - Occupational Stress Questionnaire    Feeling of Stress : Only a little  Social Connections: Moderately Isolated (06/25/2023)   Social Connection and Isolation Panel    Frequency of Communication with Friends and Family: More than three times a week    Frequency of Social Gatherings with Friends and Family: Twice a week    Attends Religious Services: Never    Database administrator or Organizations: No    Attends Banker Meetings: Never    Marital Status: Married    Outpatient Medications Prior to Visit  Medication Sig Dispense Refill   Ca Phosphate-Cholecalciferol (EQL CALCIUM GUMMIES PO) Take 2 tablets by mouth daily.     Cholecalciferol (VITAMIN D3) 1000 units CAPS Take 1,000 Units by mouth daily.     levothyroxine  (SYNTHROID ) 88 MCG tablet Take 1 tablet (88 mcg total) by mouth daily before breakfast. 90 tablet 3   triamcinolone  (KENALOG ) 0.025 % cream APPLY TO AFFECTED AREA TWICE A DAY AS DIRECTED (Patient taking differently: Apply 1 Application topically daily as needed (for itching).) 80 g 1   omeprazole  (PRILOSEC) 40 MG capsule TAKE 1 CAPSULE (40 MG TOTAL) BY MOUTH IN THE MORNING (Patient not taking: Reported on 08/15/2023) 90 capsule 1   traMADol  (ULTRAM ) 50 MG tablet Take 1 tablet (50 mg total) by mouth every 6 (six) hours as needed. (Patient not taking: Reported on 08/15/2023) 20 tablet 0   No facility-administered medications prior to visit.     EXAM:  BP 130/80   Pulse 89   Temp 98 F (36.7 C) (Oral)   Ht 5' 1.5 (1.562 m)   Wt 203 lb 9.6 oz (92.4 kg)   SpO2 96%   BMI 37.85 kg/m   Body mass index is 37.85 kg/m. Wt Readings from Last 3 Encounters:  08/15/23 203 lb 9.6 oz (92.4 kg)  06/28/23 201 lb (91.2 kg)  06/26/23  205 lb 7.5 oz (93.2 kg)    GENERAL: vitals reviewed and listed above, alert, oriented, appears well hydrated and in no acute distress HEENT: atraumatic, conjunctiva  clear,  no obvious abnormalities on inspection of external nose and ears perrl  no photophobia today  no redness  OP : no lesion edema or exudate  NECK: no obvious masses on inspection palpation  CV: HRRR, no clubbing cyanosis or  peripheral edema nl cap refill  MS: moves all extremities without noticeable focal  abnormality PSYCH: pleasant and cooperative, no obvious depression or anxiety Lab Results  Component Value Date   WBC 5.0 07/11/2023   HGB 12.3 07/11/2023   HCT 37.7 07/11/2023   PLT 221.0 07/11/2023   GLUCOSE 99 07/11/2023   CHOL 187 05/30/2023   TRIG 118.0 05/30/2023   HDL 63.30 05/30/2023   LDLDIRECT 113.9 06/22/2010   LDLCALC 100 (H) 05/30/2023   ALT 27 07/11/2023   AST 23 07/11/2023   NA 138 07/11/2023   K 4.1 07/11/2023   CL 102 07/11/2023   CREATININE 0.61 07/11/2023   BUN 17 07/11/2023   CO2 24 07/11/2023   TSH 3.71 05/30/2023   HGBA1C 5.8 05/30/2023   MICROALBUR 0.7 04/14/2006   BP Readings from Last 3 Encounters:  08/15/23 130/80  06/28/23 116/76  06/26/23 124/67    ASSESSMENT AND PLAN:  Discussed the following assessment and plan:  Visual symptom interfering with vision  Cataract, unspecified cataract type, unspecified laterality  History of scleritis  Light sensitivity  -Patient advised to return or notify health care team  if  new concerns arise.  Patient Instructions  Will do a referral  to [facility]   for reasons we discussed .   Anginette Espejo K. Lexus Shampine M.D.

## 2023-08-15 NOTE — Patient Instructions (Addendum)
 Will do a referral  to [facility]   for reasons we discussed .

## 2024-01-18 ENCOUNTER — Ambulatory Visit: Admitting: Family Medicine

## 2024-03-20 ENCOUNTER — Telehealth: Payer: Self-pay | Admitting: Internal Medicine

## 2024-03-20 NOTE — Telephone Encounter (Signed)
 Good afternoon Dr. Albertus,  We received a transfer of care request from patient. Patient had last office visit on 07/27/2023 with [facility]. Records can be found in the encounters tab of chart. Patient requesting transfer due to previous care with you and location. Please review and advise for scheduling. Thank you

## 2024-03-21 NOTE — Telephone Encounter (Signed)
Ok for an appt JMP

## 2024-03-22 ENCOUNTER — Encounter: Payer: Self-pay | Admitting: Internal Medicine

## 2024-04-17 IMAGING — MG MM DIGITAL SCREENING BILAT W/ TOMO AND CAD
8 series · 8 of 24 positions shown · non-contrast
Comparison: Previous exam(s).

CLINICAL DATA: Screening.

EXAM:
DIGITAL SCREENING BILATERAL MAMMOGRAM WITH TOMOSYNTHESIS AND CAD
TECHNIQUE: Bilateral screening digital craniocaudal and mediolateral oblique
mammograms were obtained. Bilateral screening digital breast
tomosynthesis was performed. The images were evaluated with
computer-aided detection.

[L CC synth-2D]
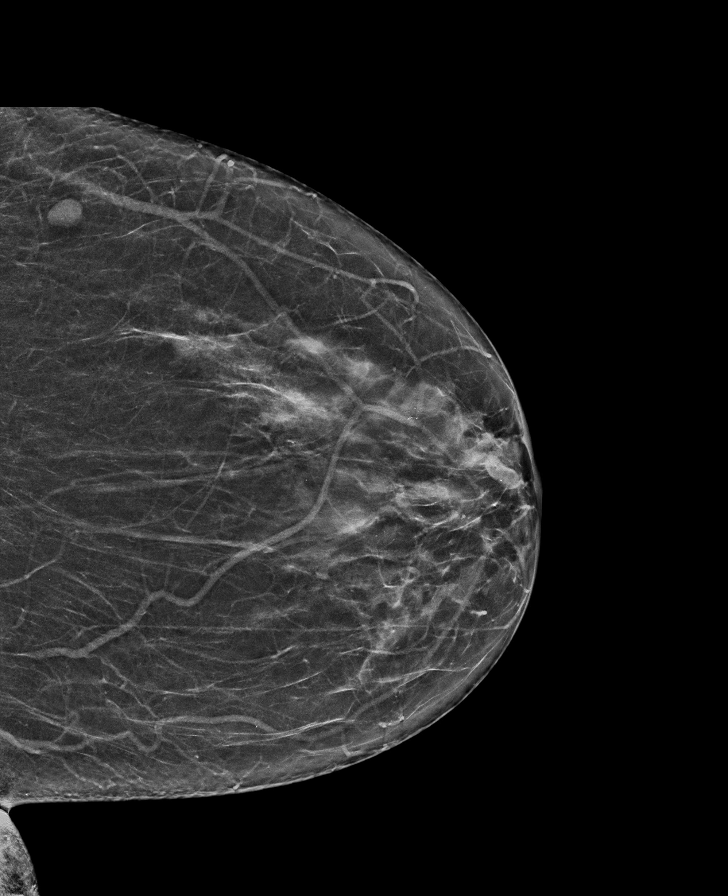

[R MLO synth-2D]
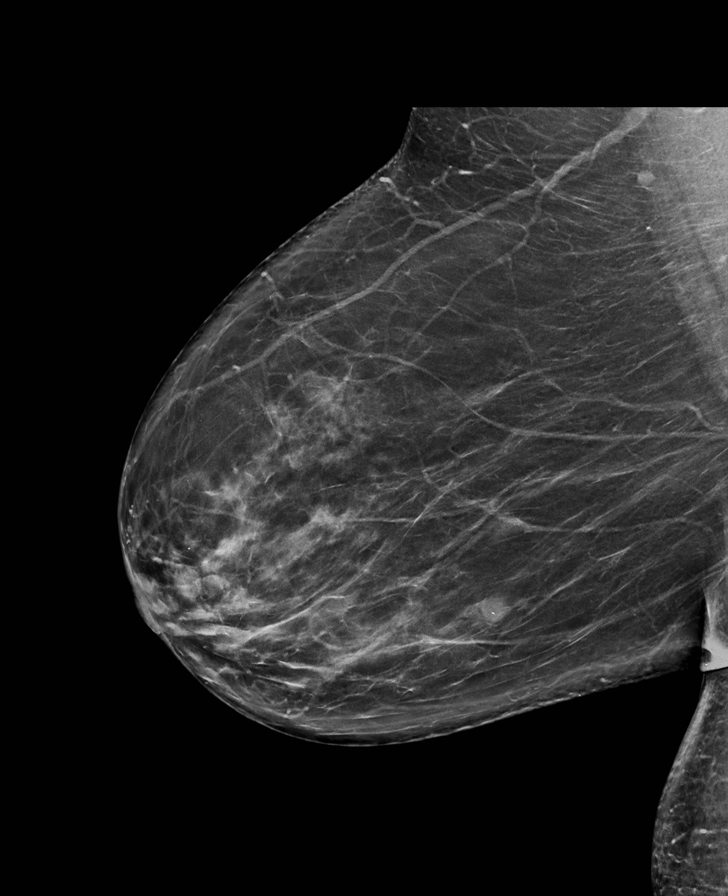

[R CC synth-2D]
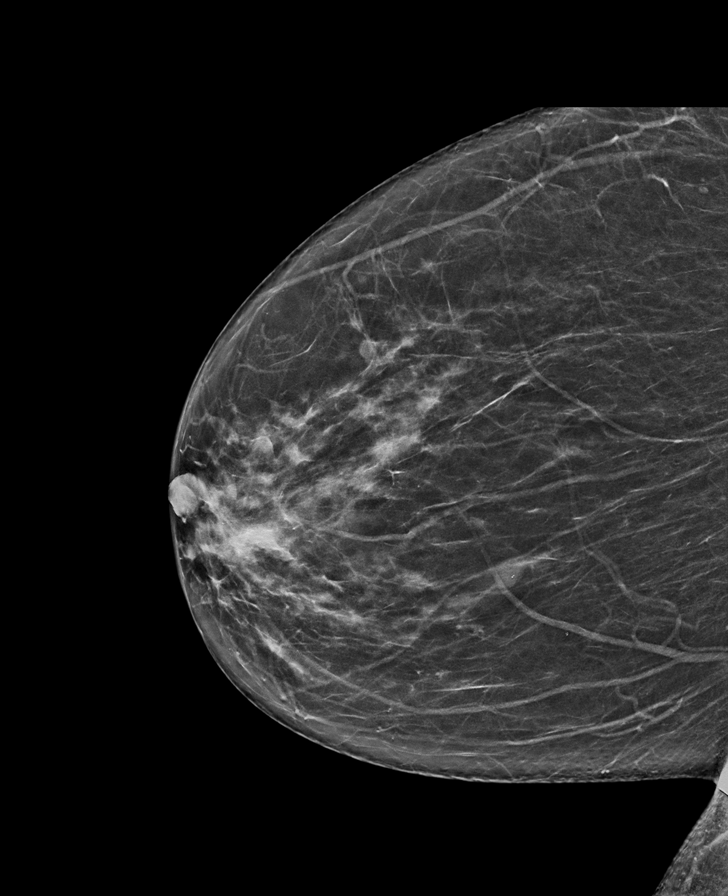

[L MLO synth-2D]
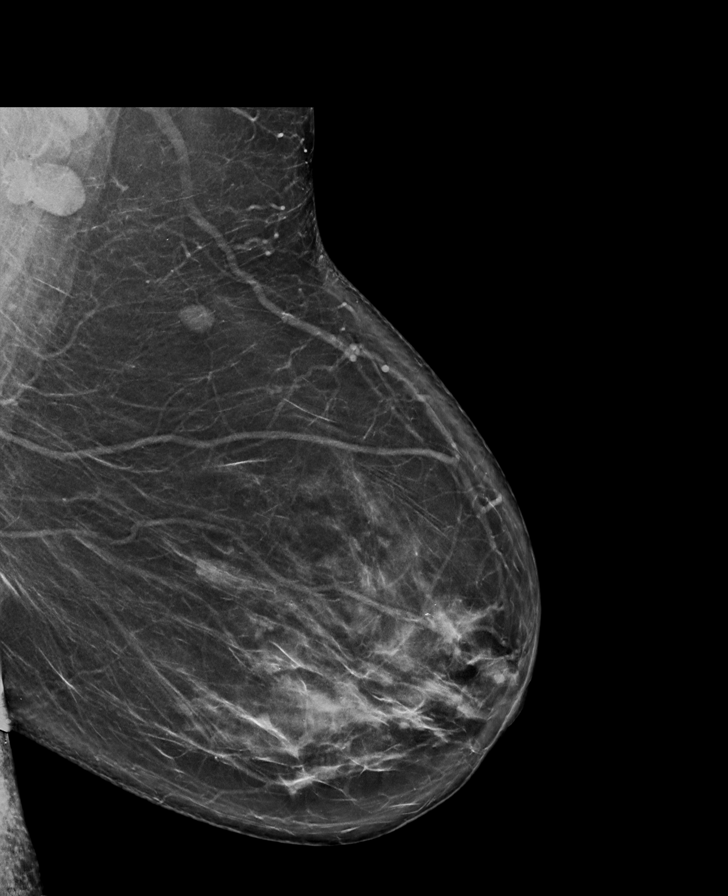

[L MLO tomo · tomo slice 43/85.0]
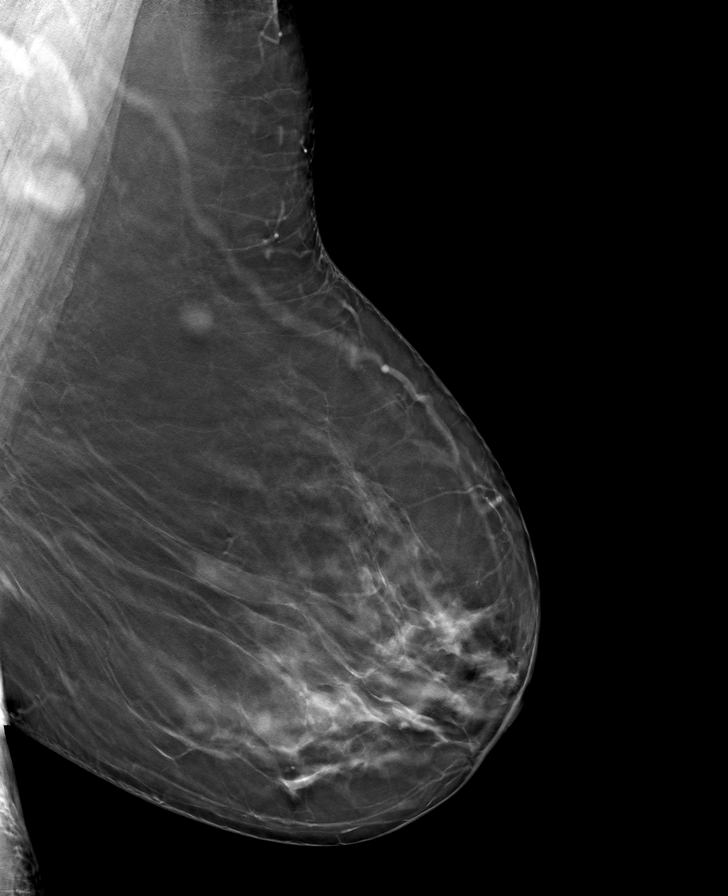

[L CC tomo · tomo slice 34/67.0]
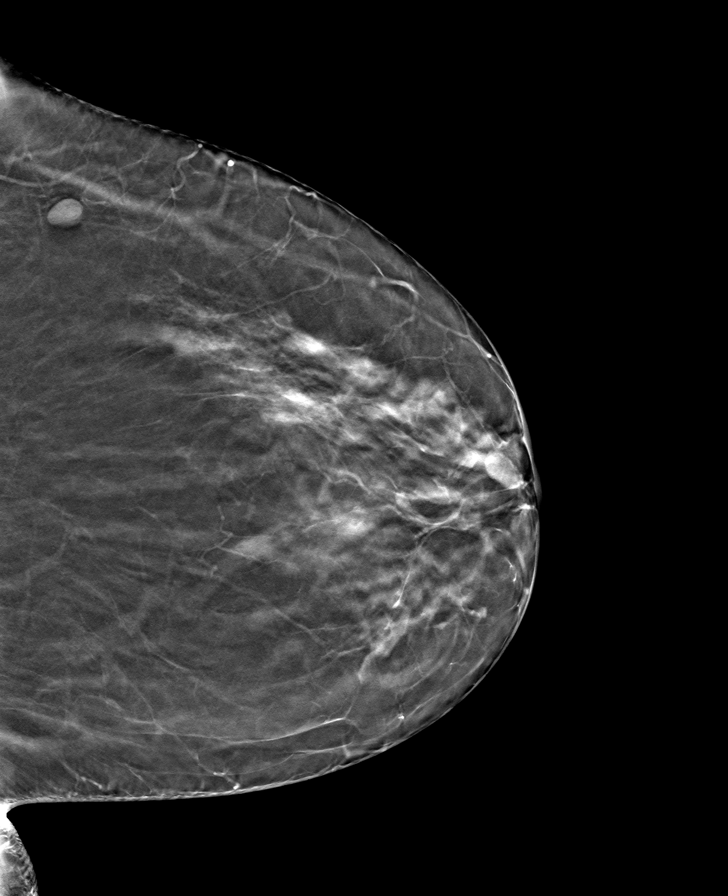

[R MLO tomo · tomo slice 43/84.0]
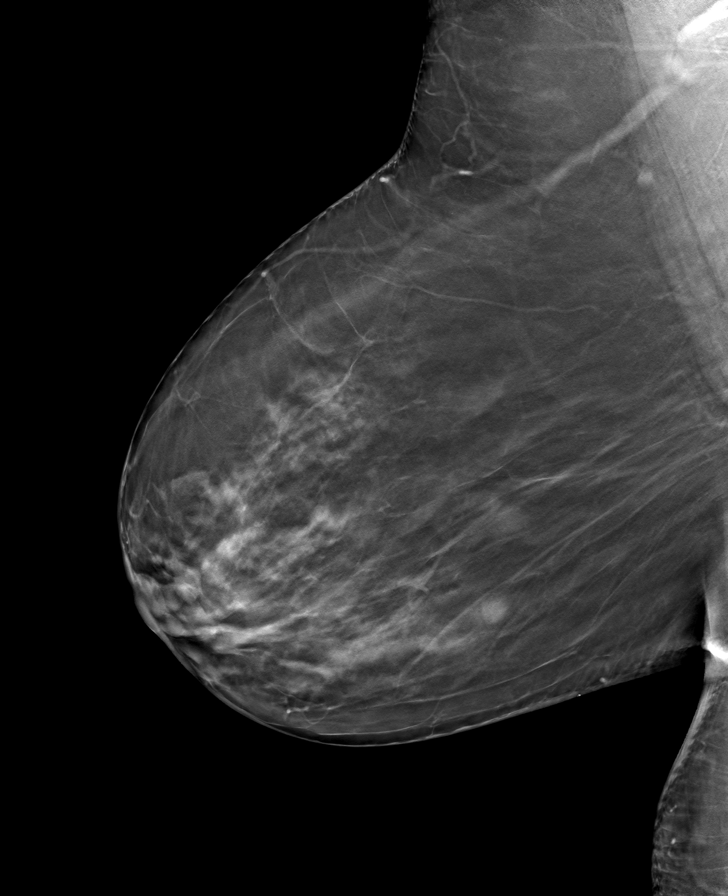

[R CC tomo · tomo slice 35/69.0]
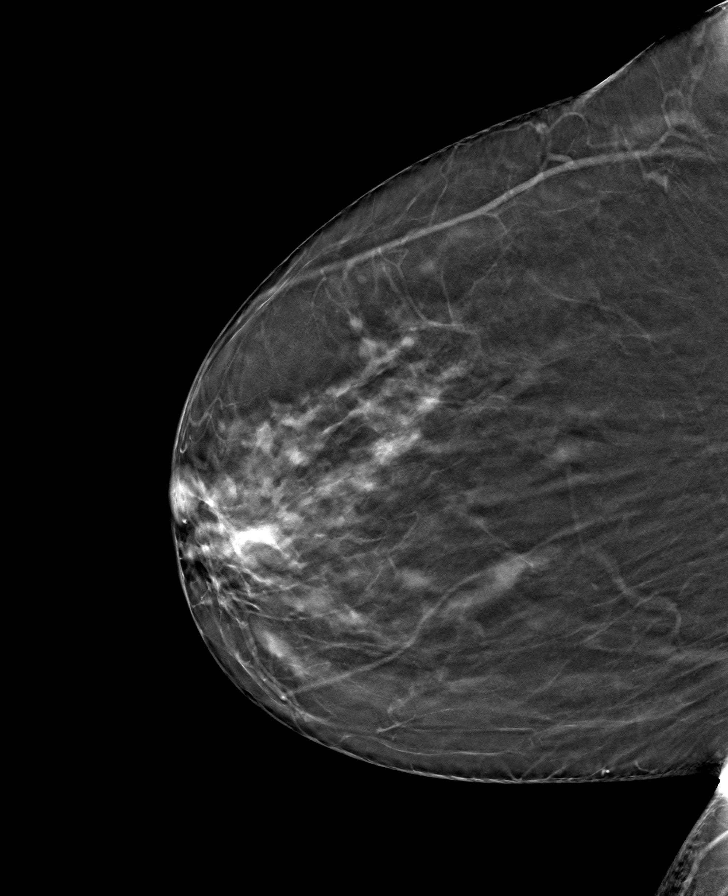

[8 of 24 positions shown; findings below may reference images not displayed]

ACR Breast Density Category c: The breast tissue is heterogeneously
dense, which may obscure small masses.
FINDINGS: There are no findings suspicious for malignancy.
IMPRESSION: No mammographic evidence of malignancy. A result letter of this
screening mammogram will be mailed directly to the patient.

RECOMMENDATION:
Screening mammogram in one year. (Code:Q3-W-BC3)

BI-RADS CATEGORY  1: Negative.

## 2024-04-19 ENCOUNTER — Ambulatory Visit

## 2024-05-23 ENCOUNTER — Ambulatory Visit: Admitting: Internal Medicine

## 2024-06-06 ENCOUNTER — Ambulatory Visit: Admitting: Internal Medicine
